# Patient Record
Sex: Female | Born: 1960 | Race: White | Hispanic: No
Health system: Midwestern US, Community
[De-identification: ages and names within clinical notes are randomized; demographics above are authoritative.]

## PROBLEM LIST (undated history)

## (undated) DIAGNOSIS — J45909 Unspecified asthma, uncomplicated: Secondary | ICD-10-CM

## (undated) DIAGNOSIS — J449 Chronic obstructive pulmonary disease, unspecified: Secondary | ICD-10-CM

## (undated) DIAGNOSIS — D649 Anemia, unspecified: Secondary | ICD-10-CM

## (undated) DIAGNOSIS — B182 Chronic viral hepatitis C: Secondary | ICD-10-CM

## (undated) DIAGNOSIS — D696 Thrombocytopenia, unspecified: Secondary | ICD-10-CM

## (undated) DIAGNOSIS — F329 Major depressive disorder, single episode, unspecified: Secondary | ICD-10-CM

## (undated) DIAGNOSIS — F419 Anxiety disorder, unspecified: Secondary | ICD-10-CM

## (undated) DIAGNOSIS — T7840XA Allergy, unspecified, initial encounter: Secondary | ICD-10-CM

## (undated) DIAGNOSIS — M1990 Unspecified osteoarthritis, unspecified site: Secondary | ICD-10-CM

## (undated) DIAGNOSIS — K7460 Unspecified cirrhosis of liver: Secondary | ICD-10-CM

## (undated) DIAGNOSIS — M549 Dorsalgia, unspecified: Secondary | ICD-10-CM

## (undated) DIAGNOSIS — G8929 Other chronic pain: Secondary | ICD-10-CM

## (undated) DIAGNOSIS — F32A Depression, unspecified: Secondary | ICD-10-CM

## (undated) DIAGNOSIS — M5430 Sciatica, unspecified side: Secondary | ICD-10-CM

## (undated) DIAGNOSIS — I10 Essential (primary) hypertension: Secondary | ICD-10-CM

## (undated) DIAGNOSIS — E876 Hypokalemia: Secondary | ICD-10-CM

## (undated) DIAGNOSIS — M5416 Radiculopathy, lumbar region: Principal | ICD-10-CM

## (undated) DIAGNOSIS — R0602 Shortness of breath: Secondary | ICD-10-CM

## (undated) DIAGNOSIS — N182 Chronic kidney disease, stage 2 (mild): Secondary | ICD-10-CM

## (undated) DIAGNOSIS — F17200 Nicotine dependence, unspecified, uncomplicated: Secondary | ICD-10-CM

## (undated) DIAGNOSIS — W57XXXA Bitten or stung by nonvenomous insect and other nonvenomous arthropods, initial encounter: Secondary | ICD-10-CM

## (undated) HISTORY — PX: BACK SURGERY: SHX140

## (undated) HISTORY — DX: Anxiety disorder, unspecified: F41.9

## (undated) HISTORY — DX: Unspecified osteoarthritis, unspecified site: M19.90

## (undated) HISTORY — DX: Essential (primary) hypertension: I10

## (undated) HISTORY — DX: Allergy, unspecified, initial encounter: T78.40XA

## (undated) HISTORY — PX: TUBAL LIGATION: SHX77

## (undated) HISTORY — DX: Unspecified asthma, uncomplicated: J45.909

## (undated) NOTE — Progress Notes (Signed)
 Formatting of this note is different from the original. Images from the original note were not included.  Jordan Grnak, D.O.  [facility] [address] [place], [place]  [address] Tel: [phone]  Office Visit: Follow Up   Patient Name: Cassandra Montes  DOB:  Sep 06, 1961  MRN:   [id number]    Today's Date: 04/17/21 12:59 PM  Subjective   The patient is a 56 y.o. year old female with a pmh as listed below. She presents today for follow up for chronic liver disease, CKD, obesity, hypertension, and anxiety/depression. Since her last visit she had her mammogram done and low dose CT of her chest, both of which showed no evidence of malignancy. She has her OB/GYN appointment on 04/22/2021 for establishing care and a pap smear. She states she was taken off lisinopril/HCTZ due being switched to Spironolactone. She has an EGD next month. She will see her transplant doctor soon but does not know when it is exactly. She has not seen pain management yet.   Review of Systems  Constitutional: Negative.   HENT: Negative.   Respiratory: Negative.   Cardiovascular: Negative.   Gastrointestinal: Negative.   Genitourinary: Negative.   Musculoskeletal: Negative.   Skin: Negative.   Neurological: Negative.  Negative for dizziness.  Endo/Heme/Allergies: Negative for environmental allergies and polydipsia. Bruises/bleeds easily.  Psychiatric/Behavioral: Negative.    Past Medical History:  Diagnosis Date  ? Anxiety and depression 03/06/2021  ? Chronic hepatitis C without hepatic coma (HCC)   ? Cirrhosis of liver without ascites (HCC) 03/06/2021  ? Obesity (BMI 30.0-34.9) 03/06/2021  ? Primary hypertension 03/06/2021  ? Stage 2 chronic kidney disease 03/06/2021  ? Thrombocytopenia (HCC) 04/17/2021    Social History   Socioeconomic History  ? Marital status: SINGLE    Spouse name: Not on file  ? Number of children: Not on file  ? Years of education: Not on file  ? Highest  education level: Not on file  Occupational History  ? Not on file  Tobacco Use  ? Smoking status: Current Every Day Smoker    Packs/day: 0.50    Years: 40.00    Pack years: 20.00    Types: Cigarettes  ? Smokeless tobacco: Never Used  Substance and Sexual Activity  ? Alcohol use: No  ? Drug use: No  ? Sexual activity: Yes  Other Topics Concern  ? Not on file  Social History Narrative  ? Not on file   Social Determinants of Health   Financial Resource Strain:   ? Difficulty of Paying Living Expenses: Not on file  Food Insecurity:   ? Worried About Running Out of Food in the Last Year: Not on file  ? Ran Out of Food in the Last Year: Not on file  Transportation Needs:   ? Lack of Transportation (Medical): Not on file  ? Lack of Transportation (Non-Medical): Not on file  Physical Activity:   ? Days of Exercise per Week: Not on file  ? Minutes of Exercise per Session: Not on file  Stress:   ? Feeling of Stress : Not on file  Social Connections:   ? Frequency of Communication with Friends and Family: Not on file  ? Frequency of Social Gatherings with Friends and Family: Not on file  ? Attends Religious Services: Not on file  ? Active Member of Clubs or Organizations: Not on file  ? Attends Banker Meetings: Not on file  ? Marital Status: Not on file  Intimate Partner Violence:   ? Fear of Current or Ex-Partner: Not on file  ? Emotionally Abused: Not on file  ? Physically Abused: Not on file  ? Sexually Abused: Not on file  Housing Stability:   ? Unable to Pay for Housing in the Last Year: Not on file  ? Number of Places Lived in the Last Year: Not on file  ? Unstable Housing in the Last Year: Not on file    Current Outpatient Medications  Medication Sig  ? varicella-zoster (Shingrix Adjuvant Component-PF) injection 1 Each by IntraMUSCular route once for 1 dose.  ? ALPRAZolam  (XANAX ) 0.25 mg tablet Take 1 Tablet by mouth two (2) times a day. Max Daily  Amount: 0.5 mg.  ? albuterol  (PROVENTIL  HFA, VENTOLIN  HFA, PROAIR  HFA) 90 mcg/actuation inhaler Take 1 Puff by inhalation every four (4) hours as needed for Wheezing.  ? albuterol -ipratropium (DUO-NEB) 2.5 mg-0.5 mg/3 ml nebu INHALE CONTENTS OF 1 VIAL VIA NEBULIZER DAILY AS NEEDED FOR WHEEZING  ? amLODIPine  (NORVASC ) 10 mg tablet amlodipine  10 mg tablet  ? omeprazole (PRILOSEC) 40 mg capsule Take 1 Capsule by mouth daily for 90 days.  ? ondansetron  hcl (ZOFRAN ) 8 mg tablet ondansetron  HCl 8 mg tablet  ? furosemide (LASIX) 20 mg tablet Take 1 Tablet by mouth daily for 90 days.  ? potassium chloride  (K-DUR, KLOR-CON  M20) 20 mEq tablet take 1 tablet twice a day  ? lactulose  (CHRONULAC ) 20 gram/30 mL soln solution Take 30 mL by mouth nightly for 90 days.  ? beclomethasone (Qvar) 40 mcg/actuation aero Take 1 Puff by inhalation two (2) times a day.  ? albuterol  sulfate 2.5 mg/0.5 mL nebu 2.5 mg, ipratropium 0.02 % soln 0.5 mg 1 Dose by Nebulization route as needed for Wheezing.  ? fluticasone propion-salmeteroL (ADVAIR/WIXELA) 250-50 mcg/dose diskus inhaler fluticasone 250 mcg-salmeterol 50 mcg/dose blistr powdr for inhalation  INHALE 1 PUFF BY MOUTH TWICE DAILY  ? citalopram (CELEXA) 40 mg tablet citalopram 40 mg tablet  TAKE 1 TABLET BY MOUTH EVERY MORNING  ? zinc sulfate (ZINCATE) 50 mg zinc (220 mg) capsule zinc sulfate 50 mg zinc (220 mg) capsule  TAKE ONE CAPSULE BY MOUTH TWICE DAILY WITH MEALS.   No current facility-administered medications for this visit.   Objective   Vitals:   04/17/21 1311  BP: (!) 109/59  BP 1 Location: Left upper arm  BP Patient Position: Sitting  BP Cuff Size: Adult long  Pulse: 63  Temp: 98 F (36.7 C)  TempSrc: Temporal  Resp: 12  Height: 5' 5 (1.651 m)  Weight: 191 lb (86.6 kg) Comment: with shoes  SpO2: 98% Comment: Room air    Physical Exam: Constitutional: Appears well kempt. Alert/oriented x3. In no acute distress. Head: Normocephalic No  trauma. No deformity. No bruits.  Neck: Supple. ROM normal. No tenderness. No masses. Cardiac: Heart with normal rate/rhythm. No murmurs. No gallops. Pulses normal.  Skin: Small areas of bruises/ecchymosis on bilateral arms, hands, and on her chest Pulmonary: Lungs clear to auscultation bilaterally. In no respiratory distress. No wheezing. No rales. No rhonci.  Psychiatric: Normal thought content. Normal behavior. Normal judgment.   Recommendations   Assessment: Patient Active Problem List  Diagnosis Code  ? Primary hypertension I10  ? Anxiety and depression F41.9, F32.A  ? Obesity (BMI 30.0-34.9) E66.9  ? Cirrhosis of liver without ascites (HCC) K74.60  ? Stage 2 chronic kidney disease N18.2  ? Thrombocytopenia (HCC) D69.6   Plan: -continue current treatment plan  -follow up  with OB/GYN -refill xanax  -follow up with GI/transplant doctor as needed -recommend patient discuss her low platelets with GI next month, I do think her thrombocytopenia is due to her advanced liver disease, will refer to Dr. Mckinley with hematology, she has seen him before; she will also call his office to try and expedite the appointment  -The patient expresses understanding of the plan as I've explained it to her and is in agreement with the current plan. -schedule AWV -follow up in 6 months  Time Spent in Patient Room: 15 minutes Time Spent Pre- and Post Reviewing patient's chart: 15 minutes Total Time: 30 minutes  Signed: Jordan Grnak, D.O. 04/17/21 12:59 PM Electronically signed by Grnak, Jordan J, DO at 04/17/2021  1:36 PM EDT

---

## 2007-12-23 NOTE — ED Notes (Signed)
I have reviewed discharge instructions with the patient.  The patient verbalized understanding.

## 2007-12-23 NOTE — ED Notes (Signed)
Ambulatory  To room # 15 for exam, reports awoke this am with pain in left hand, weak grip on left, also reports numbness in 4th & 5th fingers. Visitor at side

## 2007-12-23 NOTE — ED Provider Notes (Signed)
Hand Pain   The history is provided by the patient and spouse. This is a chronic problem. Episode Onset: history of carpal tunnel syndrome. Pain worse in past few days. The problem occurs daily. The problem has been gradually worsening. The pain is present in the left hand. Quality: pain in thumb, index, and long fingers. Numbness in ring and small fingers.  The pain is moderate. Associated symptoms include numbness and tingling. Pertinent negatives include no limited range of motion, no back pain and no neck pain. Treatments Tried: ibuprofen 400 mg this am. Has had cortisone injection in elbow, shoulder and hand in the past. There has been no history of trauma.        No past medical history on file.     Past Surgical History   Procedure Date   ??? Hx gyn      BTL           No family history on file.     History   Social History   ??? Marital Status: Single     Spouse Name: N/A     Number of Children: N/A   ??? Years of Education: N/A   Occupational History   ??? Not on file.   Social History Main Topics   ??? Tobacco Use: Yes -- 1.5 packs/day   ??? Alcohol Use: No   ??? Drug Use:    ??? Sexually Active:    Other Topics Concern   ??? Not on file   Social History Narrative   ??? No narrative on file           ALLERGIES: Review of patient's allergies indicates no known allergies.      Review of Systems   Constitutional: Negative.    HENT: Negative for neck pain.    Respiratory: Positive for cough and wheezing.         Out of inhaler.   Cardiovascular: Negative.    Musculoskeletal: Positive for myalgias. Negative for back pain.   Skin: Negative.    Neurological: Positive for tingling, sensory change and numbness.   Psychiatric/Behavioral: Negative.        Filed Vitals:    12/23/2007 11:02 AM   BP: 123/67   Pulse: 90   Temp: 98.1 ??F (36.7 ??C)   Resp: 16   Height: 5\' 5"  (1.651 m)   Weight: 175 lb (79.379 kg)   SpO2: 99%              Physical Exam   Nursing note and vitals reviewed.   Constitutional: She is oriented. She appears well-developed and well-nourished. She appears not diaphoretic. No distress.   HENT:   Head: Normocephalic and atraumatic.   Eyes: Pupils are equal, round, and reactive to light.   Neck: Normal range of motion. Neck supple.   Cardiovascular: Normal rate, regular rhythm, normal heart sounds and intact distal pulses.    Pulmonary/Chest: Effort normal. No respiratory distress. She has wheezes (few scattered.). She has no rales. She exhibits no tenderness.   Musculoskeletal:        Left hand with muscle wasting in web space between thumb and index fingers. Subjective decrease sensation ring and small fingers. Strength is slightly less than right hand. Pulses strong. No localized tenderness, redness, deformity.   Lymphadenopathy:     She has no cervical adenopathy.   Neurological: She is alert and oriented. She displays normal reflexes. No cranial nerve deficit. She exhibits normal muscle tone. Coordination normal.   Skin: Skin  is warm and dry. She is not diaphoretic.   Psychiatric: She has a normal mood and affect. Her behavior is normal.                Hand pain with neuropathy.  Wheezing. Continues to smoke.  Refill inhaler.  Pain medication.  Further evaluation by PCP.

## 2009-04-02 MED ORDER — TRAMADOL 50 MG TAB
50 mg | ORAL_TABLET | Freq: Four times a day (QID) | ORAL | Status: AC | PRN
Start: 2009-04-02 — End: 2009-04-12

## 2009-04-02 MED ORDER — CYCLOBENZAPRINE 10 MG TAB
10 mg | ORAL_TABLET | Freq: Three times a day (TID) | ORAL | Status: AC | PRN
Start: 2009-04-02 — End: 2009-04-12

## 2009-04-02 MED ORDER — METHYLPREDNISOLONE 4 MG TABS IN A DOSE PACK
4 mg | PACK | ORAL | Status: AC
Start: 2009-04-02 — End: 2009-04-08

## 2009-04-02 NOTE — ED Notes (Signed)
Pt signed hard copy of discharge papers due to no e-signs being available at time of discharge.

## 2009-04-02 NOTE — ED Provider Notes (Signed)
HPI Comments: Patient presents to ed with right sided back pain that began yesterday but worsened today. The patient got off of work and had no pain but it began to worsen over night. The pain is worse with palpation and deep breathing. The patient has history of   pleurisy. It feels like the same but no recent history of uri/bronchitis/pneumonia.     Patient is a 48 y.o. female presenting with back pain. The history is provided by the patient. No language interpreter was used.   Back Pain   This is a new problem. The current episode started yesterday. The problem has not changed since onset. The problem occurs constantly. Patient reports no work related injury.The pain is associated with no known injury. The pain is present in the right side. The quality of the pain is described as cramping and sharp. The pain does not radiate. The pain is severe. The pain is the same all the time. Pertinent negatives include no chest pain, no fever, no numbness, no abdominal pain, no abdominal swelling, no bowel incontinence, no bladder incontinence, no dysuria, no paresthesias and no paresis. She has tried bed rest for the symptoms. The treatment provided no relief. Risk factors include menopause. The patient's surgical history non-contributory        Past Medical History   Diagnosis Date   ??? COPD    ??? Hypertension    ??? Psychiatric disorder      depression, anxiety          Past Surgical History   Procedure Date   ??? Hx gyn      BTL           No family history on file.     History   Social History   ??? Marital Status: Single     Spouse Name: N/A     Number of Children: N/A   ??? Years of Education: N/A   Occupational History   ??? Not on file.   Social History Main Topics   ??? Tobacco Use: Yes -- 1.5 packs/day   ??? Alcohol Use: No   ??? Drug Use:    ??? Sexually Active:    Other Topics Concern   ??? Not on file   Social History Narrative   ??? No narrative on file           ALLERGIES: Review of patient's allergies indicates no known allergies.       Review of Systems   Constitutional: Negative for fever.   Cardiovascular: Negative for chest pain.   Gastrointestinal: Negative for abdominal pain.   Genitourinary: Negative for bladder incontinence and dysuria.   Musculoskeletal: Positive for back pain.   Neurological: Negative for numbness.   All other systems reviewed and are negative.        Filed Vitals:    04/02/2009 11:01 AM 04/02/2009 11:02 AM   BP: 161/94    Pulse: 90    Temp: 98.1 ??F (36.7 ??C)    Resp: 15    Height:  5\' 5"  (1.651 m)   Weight:  165 lb (74.844 kg)   SpO2:  100%              Physical Exam   Nursing note and vitals reviewed.  Constitutional: She is oriented. She appears well-developed and well-nourished.   HENT:   Head: Normocephalic and atraumatic.   Right Ear: External ear normal.   Left Ear: External ear normal.   Nose: Nose normal.   Mouth/Throat:  Oropharynx is clear and moist.   Eyes: Conjunctivae and extraocular motions are normal. Pupils are equal, round, and reactive to light.   Neck: Normal range of motion. Neck supple.   Cardiovascular: Normal rate, regular rhythm, normal heart sounds and intact distal pulses.    Pulmonary/Chest: Effort normal and breath sounds normal. No respiratory distress. She has no wheezes.     She exhibits tenderness.   Abdominal: Soft. Bowel sounds are normal.   Musculoskeletal: Normal range of motion. She exhibits no edema and no tenderness.   Neurological: She is alert and oriented. No cranial nerve deficit. She exhibits normal muscle tone.   Skin: Skin is warm and dry.   Psychiatric: She has a normal mood and affect. Her behavior is normal. Judgment and thought content normal.            Coding      Procedures

## 2009-04-02 NOTE — ED Notes (Signed)
I have reviewed discharge instructions with the patient.  The patient verbalized understanding. Dr. Sheffield Slider and RN spoke with pt at length regarding plan of treatment. Rx, work note for today and tomorrow, and discharge instructions given to pt. Pt and significant other with no further concerns or questions at this time. Pt informed to follow up with PCP and return to ER if needed.

## 2009-04-02 NOTE — ED Notes (Signed)
Received triage report at this time.

## 2009-04-02 NOTE — ED Notes (Signed)
Will discharge pt once registered.

## 2009-09-21 LAB — CBC WITH AUTOMATED DIFF
ABS. BASOPHILS: 0 10*3/uL (ref 0.0–0.2)
ABS. EOSINOPHILS: 0.1 10*3/uL (ref 0.0–0.8)
ABS. IMM. GRANS.: 0 10*3/uL (ref 0.0–2.0)
ABS. LYMPHOCYTES: 2.2 10*3/uL (ref 0.5–4.6)
ABS. MONOCYTES: 0.7 10*3/uL (ref 0.1–1.3)
ABS. NEUTROPHILS: 4.9 10*3/uL (ref 1.7–8.2)
BASOPHILS: 0 % (ref 0.0–2.0)
EOSINOPHILS: 1 % (ref 0.5–7.8)
HCT: 38.5 % (ref 37.6–48.3)
HGB: 11.6 g/dL — ABNORMAL LOW (ref 11.7–15.0)
IMMATURE GRANULOCYTES: 0.1 % (ref 0.0–2.0)
LYMPHOCYTES: 28 % (ref 13–44)
MCH: 24.3 PG — ABNORMAL LOW (ref 26.1–32.9)
MCHC: 30.1 g/dL — ABNORMAL LOW (ref 31.4–35.0)
MCV: 80.7 FL (ref 79.6–97.8)
MONOCYTES: 8 % (ref 4.0–12.0)
MPV: 10.1 FL — ABNORMAL LOW (ref 10.8–14.1)
NEUTROPHILS: 63 % (ref 43–78)
PLATELET: 250 10*3/uL (ref 140–440)
RBC: 4.77 M/uL (ref 3.86–5.18)
RDW: 15.9 % — ABNORMAL HIGH (ref 11.9–14.6)
WBC: 7.9 10*3/uL (ref 4.0–10.5)

## 2009-09-21 LAB — METABOLIC PANEL, COMPREHENSIVE
A-G Ratio: 1.1 — ABNORMAL LOW (ref 1.2–3.5)
ALT (SGPT): 44 U/L (ref 39–65)
AST (SGOT): 86 U/L — ABNORMAL HIGH (ref 15–37)
Albumin: 3.8 g/dL (ref 3.5–5.0)
Alk. phosphatase: 116 U/L (ref 50–136)
Anion gap: 12 mmol/L (ref 7–16)
BUN: 4 MG/DL — ABNORMAL LOW (ref 7–18)
Bilirubin, total: 0.3 MG/DL (ref 0.2–1.1)
CO2: 23 MMOL/L (ref 21–32)
Calcium: 8.4 MG/DL (ref 8.4–10.4)
Chloride: 102 MMOL/L (ref 98–107)
Creatinine: 1 MG/DL (ref 0.6–1.0)
GFR est AA: >60 mL/min/{1.73_m2} (ref >60)
GFR est non-AA: >60 mL/min/{1.73_m2} (ref >60)
Globulin: 3.5 g/dL (ref 2.3–3.5)
Glucose: 97 MG/DL (ref 74–106)
Potassium: 2.8 MMOL/L — CL (ref 3.5–5.1)
Protein, total: 7.3 g/dL (ref 6.3–8.2)
Sodium: 137 MMOL/L (ref 136–145)

## 2009-09-21 LAB — PROTHROMBIN TIME + INR
INR: 1.1 (ref 0.9–1.2)
Prothrombin time: 10.9 SECS — ABNORMAL HIGH (ref 9.4–10.8)

## 2009-09-21 LAB — MAGNESIUM: Magnesium: 1.4 MG/DL — CL (ref 1.8–2.4)

## 2009-09-21 MED ORDER — POTASSIUM CHLORIDE SR 20 MEQ TAB, PARTICLES/CRYSTALS
20 mEq | ORAL_TABLET | Freq: Every day | ORAL | Status: AC
Start: 2009-09-21 — End: 2009-10-21

## 2009-09-21 MED ORDER — CYCLOBENZAPRINE 10 MG TAB
10 mg | ORAL_TABLET | Freq: Three times a day (TID) | ORAL | Status: DC | PRN
Start: 2009-09-21 — End: 2009-09-21

## 2009-09-21 MED ORDER — NAPROXEN 500 MG TAB
500 mg | ORAL_TABLET | Freq: Two times a day (BID) | ORAL | Status: AC
Start: 2009-09-21 — End: 2009-10-01

## 2009-09-21 MED ORDER — MAGNESIUM OXIDE 400 MG TAB
400 mg | ORAL_TABLET | Freq: Every day | ORAL | Status: AC
Start: 2009-09-21 — End: 2009-10-21

## 2009-09-21 MED ORDER — OXYCODONE-ACETAMINOPHEN 5 MG-325 MG TAB
5-325 mg | ORAL_TABLET | Freq: Three times a day (TID) | ORAL | Status: AC | PRN
Start: 2009-09-21 — End: 2009-09-28

## 2009-09-21 MED ORDER — CYCLOBENZAPRINE 10 MG TAB
10 mg | ORAL_TABLET | Freq: Three times a day (TID) | ORAL | Status: AC | PRN
Start: 2009-09-21 — End: 2009-10-01

## 2009-09-21 MED ADMIN — potassium chloride (KAON 10%) oral liquid 30 mL: ORAL | @ 14:00:00 | NDC 00121146515

## 2009-09-21 MED ADMIN — aspirin (ASPIRIN) 325 mg tablet: @ 14:00:00 | NDC 66553000101

## 2009-09-21 MED ADMIN — ketorolac (TORADOL) injection 30 mg: INTRAVENOUS | @ 15:00:00 | NDC 00409379501

## 2009-09-21 MED ADMIN — magnesium oxide (MAG-OX) tablet 400 mg: ORAL | @ 17:00:00 | NDC 00165002241

## 2009-09-21 MED FILL — GENACOTE 325 MG TABLET,DELAYED RELEASE: 325 mg | ORAL | Qty: 1

## 2009-09-21 MED FILL — POTASSIUM CHLORIDE 10 % ORAL LIQUID: 20 mEq/15 mL | ORAL | Qty: 30

## 2009-09-21 MED FILL — ASPIRIN 325 MG TAB: 325 mg | ORAL | Qty: 1

## 2009-09-21 MED FILL — KETOROLAC TROMETHAMINE 30 MG/ML INJECTION: 30 mg/mL (1 mL) | INTRAMUSCULAR | Qty: 1

## 2009-09-21 MED FILL — MAGOX 400 MG (241.3 MG MAGNESIUM) TABLET: 400 mg (241.3 mg magnesium) | ORAL | Qty: 1

## 2009-09-21 NOTE — ED Notes (Signed)
Dr. Orvan Falconer at bedside. Pt states pain "still a 3".  Pt is up at bedside and has removed all monitoring equipment.

## 2009-09-21 NOTE — ED Notes (Signed)
Dr. Campbell at bedside.

## 2009-09-21 NOTE — ED Provider Notes (Signed)
Patient is a 48 y.o. female presenting with chest pain. The history is provided by the patient. No language interpreter was used.   Chest Pain (Angina)   This is a new problem. The current episode started 2 days ago. The problem has been gradually worsening. Duration of episode(s) is 2 days. The problem occurs constantly. The pain is associated with movement and breathing. The pain is present in the substernal region and left side. The quality of the pain is described as tightness, sharp and pressure-like. The symptoms are aggravated by movement, certain positions and deep breathing. Pertinent negatives include no diaphoresis, no malaise/fatigue, no palpitations, no nausea, no vomiting, no dizziness, no weakness and no shortness of breath. Risk factors include smoking/tobacco exposure and a sendentary lifestyle. Her past medical history is significant for HTN.Her past medical history does not include aneurysm, DM, DVT or PE. Pertinent negatives include no cardiac catheterization.       Past Medical History   Diagnosis Date   ??? COPD    ??? Hypertension    ??? Psychiatric disorder      depression, anxiety          Past Surgical History   Procedure Date   ??? Hx gyn      BTL           No family history on file.     History   Social History   ??? Marital Status: Single     Spouse Name: N/A     Number of Children: N/A   ??? Years of Education: N/A   Occupational History   ??? Not on file.   Social History Main Topics   ??? Tobacco Use: Yes -- 1.5 packs/day   ??? Alcohol Use: No   ??? Drug Use:    ??? Sexually Active:    Other Topics Concern   ??? Not on file   Social History Narrative   ??? No narrative on file           ALLERGIES: Review of patient's allergies indicates no known allergies.      Review of Systems   Constitutional: Negative.  Negative for malaise/fatigue and diaphoresis.   HENT: Negative.    Eyes: Negative.    Respiratory: Positive for chest tightness. Negative for shortness of breath.     Cardiovascular: Positive for chest pain. Negative for palpitations and leg swelling.   Gastrointestinal: Negative.  Negative for nausea and vomiting.   Genitourinary: Negative.    Musculoskeletal: Negative.    Skin: Negative.    Neurological: Negative.  Negative for dizziness and weakness.   Psychiatric/Behavioral: Negative.        There were no vitals filed for this visit.         Physical Exam   Nursing note and vitals reviewed.  Constitutional: She is oriented to person, place, and time. She appears well-developed and well-nourished. She appears distressed (mild).   HENT:   Head: Normocephalic and atraumatic.   Right Ear: External ear normal.   Left Ear: External ear normal.   Nose: Nose normal.   Mouth/Throat: Oropharynx is clear and moist. No oropharyngeal exudate.   Eyes: Conjunctivae are normal. Pupils are equal, round, and reactive to light. Right eye exhibits no discharge. Left eye exhibits no discharge. No scleral icterus.   Neck: Normal range of motion. Neck supple. No JVD present. No tracheal deviation present. No thyromegaly present.   Cardiovascular: Normal rate, regular rhythm, normal heart sounds and intact distal pulses.  Exam  reveals no gallop and no friction rub.    No murmur heard.  Pulmonary/Chest: Effort normal and breath sounds normal. No stridor. No respiratory distress. She has no wheezes. She has no rales. She exhibits tenderness (small bruise left chest).   Abdominal: Soft. Bowel sounds are normal. She exhibits no distension and no mass. No tenderness. She has no rebound and no guarding.   Musculoskeletal: Normal range of motion. She exhibits no edema and no tenderness.   Lymphadenopathy:     She has no cervical adenopathy.   Neurological: She is alert and oriented to person, place, and time. She displays normal reflexes. No cranial nerve deficit. She exhibits normal muscle tone. Coordination normal.    Skin: Skin is warm and dry. No rash noted. She is not diaphoretic. No erythema. No pallor.   Psychiatric: She has a normal mood and affect. Her behavior is normal. Judgment and thought content normal.      Recent Results (from the past 12 hour(s))   CBC WITH AUTOMATED DIFF    Collection Time    09/21/09  8:40 AM   Component Value Range   ??? WBC 7.9  4.0 - 10.5 (K/uL)   ??? RBC 4.77  3.86 - 5.18 (M/uL)   ??? HGB 11.6 (*) 11.7 - 15.0 (g/dL)   ??? HCT 38.5  37.6 - 48.3 (%)   ??? MCV 80.7  79.6 - 97.8 (FL)   ??? MCH 24.3 (*) 26.1 - 32.9 (PG)   ??? MCHC 30.1 (*) 31.4 - 35.0 (g/dL)   ??? RDW 15.9 (*) 11.9 - 14.6 (%)   ??? PLATELET 250  140 - 440 (K/uL)   ??? MPV 10.1 (*) 10.8 - 14.1 (FL)   ??? DF AUTOMATED     ??? NEUTROPHILS 63  43 - 78 (%)   ??? LYMPHOCYTES 28  13 - 44 (%)   ??? MONOCYTES 8  4.0 - 12.0 (%)   ??? EOSINOPHILS 1  0.5 - 7.8 (%)   ??? BASOPHILS 0  0.0 - 2.0 (%)   ??? ABSOLUTE NEUTS 4.9  1.7 - 8.2 (K/UL)   ??? ABSOLUTE LYMPHS 2.2  0.5 - 4.6 (K/UL)   ??? ABSOLUTE MONOS 0.7  0.1 - 1.3 (K/UL)   ??? ABSOLUTE EOSINS 0.1  0.0 - 0.8 (K/UL)   ??? ABSOLUTE BASOS 0.0  0.0 - 0.2 (K/UL)   ??? IMM. GRANS. 0.1  0.0 - 2.0 (%)   ??? ABS. IMM. GRANS. 0.0  0.0 - 2.0 (K/UL)   METABOLIC PANEL, COMPREHENSIVE    Collection Time    09/21/09  8:40 AM   Component Value Range   ??? Sodium 137  136 - 145 (MMOL/L)   ??? Potassium 2.8 (*) 3.5 - 5.1 (MMOL/L)   ??? Chloride 102  98 - 107 (MMOL/L)   ??? CO2 23  21 - 32 (MMOL/L)   ??? Anion gap 12  7 - 16 (mmol/L)   ??? Glucose 97  74 - 106 (MG/DL)   ??? BUN 4 (*) 7 - 18 (MG/DL)   ??? Creatinine 1.0  0.6 - 1.0 (MG/DL)   ??? GFR est AA >60  >60 (ml/min/1.57m2)   ??? GFR est non-AA >60  >60 (ml/min/1.60m2)   ??? Calcium 8.4  8.4 - 10.4 (MG/DL)   ??? Bilirubin, total 0.3  0.2 - 1.1 (MG/DL)   ??? ALT 44  39 - 65 (U/L)   ??? AST 86 (*) 15 - 37 (U/L)   ??? Alk. phosphatase 116  50 -  136 (U/L)   ??? Protein, total 7.3  6.3 - 8.2 (g/dL)   ??? Albumin 3.8  3.5 - 5.0 (g/dL)   ??? Globulin 3.5  2.3 - 3.5 (g/dL)   ??? A-G Ratio 1.1 (*) 1.2 - 3.5 ( )   PROTHROMBIN TIME     Collection Time    09/21/09  8:40 AM   Component Value Range   ??? Prothrombin Time-PT 10.9 (*) 9.4 - 10.8 (SECS)   ??? INR 1.1  0.9 - 1.2 ( )   MAGNESIUM    Collection Time    09/21/09  8:40 AM   Component Value Range   ??? Magnesium 1.4 (*) 1.8 - 2.4 (MG/DL)         MDM Coding   Interpretation: labs, ECG and x-ray  Total time providing critical care: 31-74 minutes. This excludes time spent performing separately reportable procedures and services.        Procedures    Two sets of cardiac enzymes neg today.   CXR neg. Right thumb xray neg.   Discussed with Dr. Einar Pheasant. Will set up for stress test.  Toradol did help pain. But still tender to palpation .

## 2009-09-21 NOTE — ED Notes (Signed)
Pt restful after ambulation to BR. NAD at this time, talkative with visitors.

## 2009-09-21 NOTE — ED Notes (Signed)
Pt denies she is having respiratory issues, but she clears her throat, or has a dry cough very frequently during evaluation. She states that is not normal but "it's just a tickle".

## 2009-09-21 NOTE — ED Notes (Signed)
Patient refused Naprosyn. States she had liver problems but was able to take percocet. Explained to her this contains tylenol. Patient would not leave without pain meds. Offered oral Toradol. Declined. Finally given Rx for Percocet 5/325 # 20.

## 2009-09-21 NOTE — ED Notes (Signed)
Magnesium level 1.4 called from lab and reported to Dr. Orvan Falconer

## 2009-09-21 NOTE — ED Notes (Signed)
EKG interpretation: (Preliminary)  Rhythm: sinus tachycardia; and regular . Rate (approx.): 105; Axis: normal; P wave: ? biatrial enlargement; QRS interval: septal infarct; ST/T wave: normal;; Other findings: abnormal ekg.

## 2009-09-22 LAB — EKG, 12 LEAD, INITIAL
Atrial Rate: 105 {beats}/min
Calculated P Axis: 81 degrees
Calculated R Axis: 69 degrees
Calculated T Axis: 82 degrees
P-R Interval: 156 ms
Q-T Interval: 372 ms
QRS Duration: 84 ms
QTC Calculation (Bezet): 491 ms
Ventricular Rate: 105 {beats}/min

## 2009-12-04 MED ORDER — TRAMADOL-ACETAMINOPHEN 37.5 MG-325 MG TAB
ORAL_TABLET | Freq: Four times a day (QID) | ORAL | Status: AC | PRN
Start: 2009-12-04 — End: 2009-12-09

## 2009-12-04 MED ADMIN — tramadol (ULTRAM) tablet 50 mg: ORAL | @ 17:00:00 | NDC 51079099101

## 2009-12-04 MED FILL — TRAMADOL 50 MG TAB: 50 mg | ORAL | Qty: 1

## 2009-12-04 NOTE — ED Provider Notes (Cosign Needed)
HPI Comments: Pt fell at home yesterday, mild facial pain but has aggravated mid back area pain, she has had pain in that area for some time, pmd treated with aleve, fall has made it worse     Patient is a 48 y.o. female presenting with back pain. The history is provided by the patient.   Back Pain   This is a new problem. The current episode started yesterday. The problem has not changed since onset. The problem occurs constantly. Patient reports no work related injury.The pain is associated with a fall. The pain is present in the thoracic spine. The quality of the pain is described as similar to previous episodes. The pain does not radiate. The pain is at a severity of 6/10. The pain is moderate. The symptoms are aggravated by certain positions. The pain is the same all the time. Pertinent negatives include no tingling. bruise to face. She has tried NSAIDs for the symptoms. The treatment provided no relief.        Past Medical History   Diagnosis Date   ??? COPD    ??? Hypertension    ??? Psychiatric disorder      depression, anxiety          Past Surgical History   Procedure Date   ??? Hx gyn      BTL           No family history on file.     History   Social History   ??? Marital Status: Single     Spouse Name: N/A     Number of Children: N/A   ??? Years of Education: N/A   Occupational History   ??? Not on file.   Social History Main Topics   ??? Smoking status: Current Everyday Smoker -- 1.5 packs/day   ??? Smokeless tobacco: Never Used   ??? Alcohol Use: No   ??? Drug Use:    ??? Sexually Active:    Other Topics Concern   ??? Not on file   Social History Narrative   ??? No narrative on file           ALLERGIES: Review of patient's allergies indicates no known allergies.      Review of Systems   Musculoskeletal: Positive for back pain.   Neurological: Negative for tingling.   All other systems reviewed and are negative.        Filed Vitals:    12/04/2009 11:08 AM   BP: 161/97   Pulse: 99   Temp: 97.8 ??F (36.6 ??C)   Resp: 16    Height: 5\' 5"  (1.651 m)   Weight: 158 lb (71.668 kg)   SpO2: 100%              Physical Exam   Nursing note and vitals reviewed.  Constitutional: She is oriented to person, place, and time. She appears well-developed and well-nourished. No distress.   HENT:   Head: Normocephalic.        Slight bruise to rt cheek area   Eyes: Conjunctivae and extraocular motions are normal. Pupils are equal, round, and reactive to light.   Neck: Normal range of motion. Neck supple.   Cardiovascular: Normal rate and regular rhythm.    Pulmonary/Chest: Effort normal and breath sounds normal. No respiratory distress. She has no wheezes.   Abdominal: Soft. Bowel sounds are normal.   Musculoskeletal: She exhibits tenderness. She exhibits no edema.        Back:  Rt mid thoracic area pain to palpation no abrasions   Neurological: She is alert and oriented to person, place, and time.   Skin: Skin is warm.        MDM Coding   Reviewed: previous chart, nursing note and vitals  Interpretation: x-ray        Procedures    t spine films negative except for mild degenerative changes

## 2009-12-04 NOTE — ED Notes (Signed)
Copy of discharge instructions given to patient. Denies any questions at this time.

## 2009-12-04 NOTE — ED Notes (Signed)
Practioner discussing test results with patient.

## 2009-12-04 NOTE — ED Notes (Signed)
To xray

## 2009-12-04 NOTE — ED Notes (Signed)
Practioner performing initial assessment of stable patient

## 2010-04-16 LAB — METABOLIC PANEL, BASIC
Anion gap: 7 mmol/L (ref 7–16)
BUN: 6 MG/DL — ABNORMAL LOW (ref 7–18)
CO2: 25 MMOL/L (ref 21–32)
Calcium: 8.8 MG/DL (ref 8.4–10.4)
Chloride: 107 MMOL/L (ref 98–107)
Creatinine: 0.9 MG/DL (ref 0.6–1.0)
GFR est AA: >60 mL/min/{1.73_m2} (ref >60)
GFR est non-AA: >60 mL/min/{1.73_m2} (ref >60)
Glucose: 77 MG/DL (ref 65–100)
Potassium: 3.5 MMOL/L (ref 3.5–5.1)
Sodium: 139 MMOL/L (ref 136–145)

## 2010-04-16 LAB — CBC WITH AUTOMATED DIFF
ABS. BASOPHILS: 0 10*3/uL (ref 0.0–0.2)
ABS. EOSINOPHILS: 0.1 10*3/uL (ref 0.0–0.8)
ABS. IMM. GRANS.: 0 10*3/uL (ref 0.0–2.0)
ABS. LYMPHOCYTES: 3.2 10*3/uL (ref 0.5–4.6)
ABS. MONOCYTES: 0.5 10*3/uL (ref 0.1–1.3)
ABS. NEUTROPHILS: 4 10*3/uL (ref 1.7–8.2)
BASOPHILS: 1 % (ref 0.0–2.0)
EOSINOPHILS: 1 % (ref 0.5–7.8)
HCT: 36.5 % — ABNORMAL LOW (ref 37.6–48.3)
HGB: 11.2 g/dL — ABNORMAL LOW (ref 11.7–15.0)
IMMATURE GRANULOCYTES: 0.3 % (ref 0.0–2.0)
LYMPHOCYTES: 41 % (ref 13–44)
MCH: 24.4 PG — ABNORMAL LOW (ref 26.1–32.9)
MCHC: 30.7 g/dL — ABNORMAL LOW (ref 31.4–35.0)
MCV: 79.5 FL — ABNORMAL LOW (ref 79.6–97.8)
MONOCYTES: 6 % (ref 4.0–12.0)
MPV: 9.4 FL — ABNORMAL LOW (ref 10.8–14.1)
NEUTROPHILS: 51 % (ref 43–78)
PLATELET: 250 10*3/uL (ref 140–440)
RBC: 4.59 M/uL (ref 3.86–5.18)
RDW: 16.8 % — ABNORMAL HIGH (ref 11.9–14.6)
WBC: 7.8 10*3/uL (ref 4.0–10.5)

## 2010-04-16 MED ORDER — FERROUS SULFATE SR 47.5 MG (IRON) TAB
47.5 mg iron | ORAL_TABLET | Freq: Every day | ORAL | Status: AC
Start: 2010-04-16 — End: 2010-05-16

## 2010-04-16 MED ORDER — ALBUTEROL SULFATE 0.083 % (0.83 MG/ML) SOLN FOR INHALATION
2.5 mg /3 mL (0.083 %) | Freq: Once | RESPIRATORY_TRACT | Status: AC
Start: 2010-04-16 — End: 2010-04-16
  Administered 2010-04-16: 16:00:00 via RESPIRATORY_TRACT

## 2010-04-16 MED ORDER — ONDANSETRON (PF) 4 MG/2 ML INJECTION
4 mg/2 mL | Freq: Once | INTRAMUSCULAR | Status: AC
Start: 2010-04-16 — End: 2010-04-16
  Administered 2010-04-16: 16:00:00 via INTRAVENOUS

## 2010-04-16 MED ORDER — HYOSCYAMINE 0.125 MG SUBLINGUAL TAB
0.125 mg | SUBLINGUAL | Status: AC
Start: 2010-04-16 — End: 2010-04-16
  Administered 2010-04-16: 16:00:00 via SUBLINGUAL

## 2010-04-16 MED ORDER — HYOSCYAMINE 0.125 MG SUBLINGUAL TAB
0.125 mg | ORAL_TABLET | SUBLINGUAL | Status: AC | PRN
Start: 2010-04-16 — End: 2011-04-11

## 2010-04-16 MED ORDER — ALBUTEROL 90 MCG/ACTUATION AEROSOL INHALER
90 mcg/actuation | RESPIRATORY_TRACT | Status: DC | PRN
Start: 2010-04-16 — End: 2021-03-06

## 2010-04-16 MED ORDER — SODIUM CHLORIDE 0.9% BOLUS IV
0.9 % | INTRAVENOUS | Status: AC
Start: 2010-04-16 — End: 2010-04-16
  Administered 2010-04-16: 16:00:00 via INTRAVENOUS

## 2010-04-16 MED FILL — HYOMAX-SL 0.125 MG SUBLINGUAL TABLET: 0.125 mg | SUBLINGUAL | Qty: 1

## 2010-04-16 MED FILL — ONDANSETRON (PF) 4 MG/2 ML INJECTION: 4 mg/2 mL | INTRAMUSCULAR | Qty: 2

## 2010-04-16 MED FILL — ALBUTEROL SULFATE 0.083 % (0.83 MG/ML) SOLN FOR INHALATION: 2.5 mg /3 mL (0.083 %) | RESPIRATORY_TRACT | Qty: 2

## 2010-04-16 NOTE — ED Provider Notes (Signed)
Patient is a 49 y.o. female presenting with fatigue. The history is provided by the patient. No language interpreter was used.   Fatigue  This is a new problem. The current episode started 2 days ago. The problem occurs constantly. The problem has been gradually worsening. Associated symptoms include shortness of breath. Pertinent negatives include no chest pain, no abdominal pain and no headaches. diarrhea. Nothing aggravates the symptoms. Nothing relieves the symptoms. She has tried nothing for the symptoms.        Past Medical History   Diagnosis Date   ??? COPD    ??? Hypertension    ??? Psychiatric disorder      depression, anxiety          Past Surgical History   Procedure Date   ??? Hx gyn      BTL           No family history on file.     History   Social History   ??? Marital Status: Single     Spouse Name: N/A     Number of Children: N/A   ??? Years of Education: N/A   Occupational History   ??? Not on file.   Social History Main Topics   ??? Smoking status: Current Everyday Smoker -- 1.5 packs/day   ??? Smokeless tobacco: Not on file   ??? Alcohol Use: No   ??? Drug Use:    ??? Sexually Active:    Other Topics Concern   ??? Not on file   Social History Narrative   ??? No narrative on file                    ALLERGIES: Review of patient's allergies indicates no known allergies.      Review of Systems   Constitutional: Positive for fatigue.   Respiratory: Positive for shortness of breath.    Cardiovascular: Negative for chest pain.   Gastrointestinal: Positive for diarrhea. Negative for abdominal pain.   Neurological: Negative for headaches.   All other systems reviewed and are negative.        Filed Vitals:    04/16/10 1027   BP: 149/77   Pulse: 87   Temp: 97.7 ??F (36.5 ??C)   Resp: 18   Height: 5\' 5"  (1.651 m)   Weight: 143 lb (64.864 kg)   SpO2: 99%              Physical Exam   Nursing note and vitals reviewed.  Constitutional: She is oriented to person, place, and time. She appears well-developed and well-nourished.   HENT:    Head: Normocephalic and atraumatic.   Right Ear: External ear normal.   Left Ear: External ear normal.   Nose: Nose normal.   Mouth/Throat: Oropharynx is clear and moist.   Eyes: Conjunctivae and extraocular motions are normal. Pupils are equal, round, and reactive to light.   Neck: Normal range of motion. Neck supple.   Cardiovascular: Normal rate, regular rhythm, normal heart sounds and intact distal pulses.    Pulmonary/Chest: Effort normal. No respiratory distress. She has wheezes.   Abdominal: Soft. Bowel sounds are normal.   Musculoskeletal: Normal range of motion. She exhibits no edema and no tenderness.   Neurological: She is alert and oriented to person, place, and time. No cranial nerve deficit. She exhibits normal muscle tone.   Skin: Skin is warm and dry.   Psychiatric: She has a normal mood and affect. Her behavior is normal. Judgment and  thought content normal.        MDM    Procedures

## 2010-04-16 NOTE — ED Notes (Signed)
I have reviewed discharge instructions with the patient.  The patient verbalized understanding.

## 2010-04-18 NOTE — ED Notes (Signed)
Client tells me I think I have a fever but do not have a thermometer, "no way I can go to work tomorrow feeling like this"

## 2010-04-18 NOTE — ED Notes (Signed)
Client was here Tuesday for breathing treatment and "iron was real low but it is getting worse"

## 2010-04-18 NOTE — ED Provider Notes (Signed)
HPI Comments: Elizabeth Ingram is a 49 y.o. Caucasian female in NAD returns to the ED stating she just doesn't feel good. Pt states she cont to have cough, leg cramps and abd pain. No fever or chills. No N&V&D. She denies Urinary symptoms    Patient is a 49 y.o. female presenting with abdominal pain, leg pain, and lethargy. The history is provided by the patient.   Abdominal Pain   This is a new problem. The current episode started more than 2 days ago. The problem occurs constantly. The problem has not changed since onset. The pain is associated with an unknown factor. The pain is located in the generalized abdominal region. The quality of the pain is cramping. The pain is at a severity of 8/10. Associated symptoms include myalgias. Pertinent negatives include no fever, no diarrhea, no nausea, no vomiting, no constipation, no dysuria, no frequency, no headaches, no arthralgias, no chest pain and no back pain. The pain is worsened by palpation. The pain is relieved by nothing.   Leg Pain   This is a recurrent problem. The current episode started more than 2 days ago. The problem occurs daily. The problem has not changed since onset. The pain is present in the left upper leg. The quality of the pain is described as intermittent and sharp. The pain is at a severity of 8/10. Pertinent negatives include no numbness, no limited range of motion, no stiffness, no tingling, no back pain and no neck pain. She has tried nothing for the symptoms. There has been no history of extremity trauma.   Lethargy  This is a new problem. The current episode started 2 days ago. The problem occurs daily. The problem has been gradually worsening. Associated symptoms include abdominal pain and shortness of breath. Pertinent negatives include no chest pain and no headaches. Nothing aggravates the symptoms. Nothing relieves the symptoms.        Past Medical History   Diagnosis Date   ??? COPD    ??? Hypertension    ??? Psychiatric disorder       depression, anxiety          Past Surgical History   Procedure Date   ??? Hx gyn      BTL   ??? Neurological procedure unlisted      nerve in left arm surgery 2009           No family history on file.     History   Social History   ??? Marital Status: Single     Spouse Name: N/A     Number of Children: N/A   ??? Years of Education: N/A   Occupational History   ??? Not on file.   Social History Main Topics   ??? Smoking status: Current Everyday Smoker -- 1.5 packs/day   ??? Smokeless tobacco: Not on file   ??? Alcohol Use: No   ??? Drug Use: No   ??? Sexually Active: Yes   Other Topics Concern   ??? Not on file   Social History Narrative   ??? No narrative on file                    ALLERGIES: Review of patient's allergies indicates no known allergies.      Review of Systems   Constitutional: Negative for fever, chills and fatigue.   HENT: Negative for neck pain and neck stiffness.    Respiratory: Positive for cough and shortness of breath.  Cardiovascular: Negative for chest pain and leg swelling.   Gastrointestinal: Positive for abdominal pain. Negative for nausea, vomiting, diarrhea and constipation.   Genitourinary: Negative for dysuria and frequency.   Musculoskeletal: Positive for myalgias. Negative for back pain and arthralgias.   Skin: Negative for rash.   Neurological: Positive for weakness. Negative for tingling, numbness and headaches.   All other systems reviewed and are negative.        Filed Vitals:    04/18/10 2120   BP: 132/87   Pulse: 87   Temp: 98.1 ??F (36.7 ??C)   Resp: 20   Height: 5\' 5"  (1.651 m)   Weight: 140 lb (63.504 kg)   SpO2: 100%              Physical Exam   Nursing note and vitals reviewed.  Constitutional: She is oriented to person, place, and time. She appears well-developed and well-nourished. No distress.   HENT:   Head: Normocephalic and atraumatic.   Right Ear: External ear normal.   Left Ear: External ear normal.   Nose: Nose normal.   Mouth/Throat: Oropharynx is clear and moist.    Eyes: Conjunctivae and extraocular motions are normal. No scleral icterus.   Neck: Normal range of motion. Neck supple.   Cardiovascular: Normal rate, regular rhythm, normal heart sounds, intact distal pulses and normal pulses.  Exam reveals no gallop.    No murmur heard.  Pulmonary/Chest: Tachypnea noted. No respiratory distress. She has decreased breath sounds. She has no wheezes. She has no rhonchi. She has no rales.   Abdominal: Soft. Normal appearance. There is no organomegaly, splenomegaly or hepatomegaly. Generalized and tenderness is present. She has no rigidity, no rebound, no guarding and no CVA tenderness.   Musculoskeletal: Normal range of motion. She exhibits no tenderness.   Neurological: She is alert and oriented to person, place, and time. She has normal strength. No cranial nerve deficit or sensory deficit. She displays a negative Romberg sign.   Skin: Skin is warm and dry. No rash noted.   Psychiatric: Her speech is normal. Judgment and thought content normal. She is slowed. Cognition and memory are normal. She exhibits a depressed mood.        MDM    Procedures    Recent Results (from the past 24 hour(s))   URINE MICROSCOPIC    Collection Time    04/18/10 10:05 PM   Component Value Range   ??? WBC 0  0 (/HPF)   ??? RBC 0-3  0 (/HPF)   ??? Epithelial cells 10-20  0 (/HPF)   ??? Bacteria 0  0 (/HPF)   ??? Casts 0  0 (/LPF)   ??? Crystals 0  0 (/LPF)   ??? Mucus 0  0 (/LPF)   CBC WITH AUTOMATED DIFF    Collection Time    04/18/10 10:10 PM   Component Value Range   ??? WBC 11.4 (*) 4.0 - 10.5 (K/uL)   ??? RBC 4.50  3.86 - 5.18 (M/uL)   ??? HGB 11.0 (*) 11.7 - 15.0 (Jericha Bryden/dL)   ??? HCT 35.7 (*) 37.6 - 48.3 (%)   ??? MCV 79.3 (*) 79.6 - 97.8 (FL)   ??? MCH 24.4 (*) 26.1 - 32.9 (PG)   ??? MCHC 30.8 (*) 31.4 - 35.0 (Thressa Shiffer/dL)   ??? RDW 16.6 (*) 11.9 - 14.6 (%)   ??? PLATELET 229  140 - 440 (K/uL)   ??? MPV 9.6 (*) 10.8 - 14.1 (FL)   ??? DF  AUTOMATED     ??? NEUTROPHILS 57  43 - 78 (%)   ??? LYMPHOCYTES 37  13 - 44 (%)   ??? MONOCYTES 5  4.0 - 12.0 (%)    ??? EOSINOPHILS 1  0.5 - 7.8 (%)   ??? BASOPHILS 0  0.0 - 2.0 (%)   ??? ABSOLUTE NEUTS 6.4  1.7 - 8.2 (K/UL)   ??? ABSOLUTE LYMPHS 4.2  0.5 - 4.6 (K/UL)   ??? ABSOLUTE MONOS 0.6  0.1 - 1.3 (K/UL)   ??? ABSOLUTE EOSINS 0.1  0.0 - 0.8 (K/UL)   ??? ABSOLUTE BASOS 0.0  0.0 - 0.2 (K/UL)   ??? IMM. GRANS. 0.3  0.0 - 2.0 (%)   ??? ABS. IMM. GRANS. 0.0  0.0 - 2.0 (K/UL)   METABOLIC PANEL, COMPREHENSIVE    Collection Time    04/18/10 10:10 PM   Component Value Range   ??? Sodium 136  136 - 145 (MMOL/L)   ??? Potassium 3.4 (*) 3.5 - 5.1 (MMOL/L)   ??? Chloride 106  98 - 107 (MMOL/L)   ??? CO2 22  21 - 32 (MMOL/L)   ??? Anion gap 8  7 - 16 (mmol/L)   ??? Glucose 77  65 - 100 (MG/DL)   ??? BUN 5 (*) 7 - 18 (MG/DL)   ??? Creatinine 0.8  0.6 - 1.0 (MG/DL)   ??? GFR est AA >60  >60 (ml/min/1.71m2)   ??? GFR est non-AA >60  >60 (ml/min/1.4m2)   ??? Calcium 8.7  8.4 - 10.4 (MG/DL)   ??? Bilirubin, total 0.3  0.2 - 1.1 (MG/DL)   ??? ALT 29 (*) 39 - 65 (U/L)   ??? AST 40 (*) 15 - 37 (U/L)   ??? Alk. phosphatase 104  50 - 136 (U/L)   ??? Protein, total 7.3  6.3 - 8.2 (Paityn Balsam/dL)   ??? Albumin 3.6  3.5 - 5.0 (Biana Haggar/dL)   ??? Globulin 3.7 (*) 2.3 - 3.5 (Polly Barner/dL)   ??? A-Leightyn Cina Ratio 1.0 (*) 1.2 - 3.5 ( )   MAGNESIUM    Collection Time    04/18/10 10:10 PM   Component Value Range   ??? Magnesium 1.6 (*) 1.8 - 2.4 (MG/DL)        CXR is neg for acute changes

## 2010-04-19 LAB — METABOLIC PANEL, COMPREHENSIVE
A-G Ratio: 1 — ABNORMAL LOW (ref 1.2–3.5)
ALT (SGPT): 29 U/L — ABNORMAL LOW (ref 39–65)
AST (SGOT): 40 U/L — ABNORMAL HIGH (ref 15–37)
Albumin: 3.6 g/dL (ref 3.5–5.0)
Alk. phosphatase: 104 U/L (ref 50–136)
Anion gap: 8 mmol/L (ref 7–16)
BUN: 5 MG/DL — ABNORMAL LOW (ref 7–18)
Bilirubin, total: 0.3 MG/DL (ref 0.2–1.1)
CO2: 22 MMOL/L (ref 21–32)
Calcium: 8.7 MG/DL (ref 8.4–10.4)
Chloride: 106 MMOL/L (ref 98–107)
Creatinine: 0.8 MG/DL (ref 0.6–1.0)
GFR est AA: >60 mL/min/{1.73_m2} (ref >60)
GFR est non-AA: >60 mL/min/{1.73_m2} (ref >60)
Globulin: 3.7 g/dL — ABNORMAL HIGH (ref 2.3–3.5)
Glucose: 77 MG/DL (ref 65–100)
Potassium: 3.4 MMOL/L — ABNORMAL LOW (ref 3.5–5.1)
Protein, total: 7.3 g/dL (ref 6.3–8.2)
Sodium: 136 MMOL/L (ref 136–145)

## 2010-04-19 LAB — CBC WITH AUTOMATED DIFF
ABS. BASOPHILS: 0 10*3/uL (ref 0.0–0.2)
ABS. EOSINOPHILS: 0.1 10*3/uL (ref 0.0–0.8)
ABS. IMM. GRANS.: 0 10*3/uL (ref 0.0–2.0)
ABS. LYMPHOCYTES: 4.2 10*3/uL (ref 0.5–4.6)
ABS. MONOCYTES: 0.6 10*3/uL (ref 0.1–1.3)
ABS. NEUTROPHILS: 6.4 10*3/uL (ref 1.7–8.2)
BASOPHILS: 0 % (ref 0.0–2.0)
EOSINOPHILS: 1 % (ref 0.5–7.8)
HCT: 35.7 % — ABNORMAL LOW (ref 37.6–48.3)
HGB: 11 g/dL — ABNORMAL LOW (ref 11.7–15.0)
IMMATURE GRANULOCYTES: 0.3 % (ref 0.0–2.0)
LYMPHOCYTES: 37 % (ref 13–44)
MCH: 24.4 PG — ABNORMAL LOW (ref 26.1–32.9)
MCHC: 30.8 g/dL — ABNORMAL LOW (ref 31.4–35.0)
MCV: 79.3 FL — ABNORMAL LOW (ref 79.6–97.8)
MONOCYTES: 5 % (ref 4.0–12.0)
MPV: 9.6 FL — ABNORMAL LOW (ref 10.8–14.1)
NEUTROPHILS: 57 % (ref 43–78)
PLATELET: 229 10*3/uL (ref 140–440)
RBC: 4.5 M/uL (ref 3.86–5.18)
RDW: 16.6 % — ABNORMAL HIGH (ref 11.9–14.6)
WBC: 11.4 10*3/uL — ABNORMAL HIGH (ref 4.0–10.5)

## 2010-04-19 LAB — MAGNESIUM: Magnesium: 1.6 MG/DL — ABNORMAL LOW (ref 1.8–2.4)

## 2010-04-19 LAB — URINE MICROSCOPIC
Bacteria: 0 /HPF
Casts: 0 /LPF
Crystals, urine: 0 /LPF
Mucus: 0 /LPF
WBC: 0 /HPF

## 2010-04-19 MED ORDER — POTASSIUM CHLORIDE 10 % ORAL LIQUID
20 mEq/15 mL | ORAL | Status: AC
Start: 2010-04-19 — End: 2010-04-18
  Administered 2010-04-19: 03:00:00 via ORAL

## 2010-04-19 MED ORDER — SODIUM CHLORIDE 0.9% BOLUS IV
0.9 % | Freq: Once | INTRAVENOUS | Status: AC
Start: 2010-04-19 — End: 2010-04-18
  Administered 2010-04-19: 02:00:00 via INTRAVENOUS

## 2010-04-19 MED ORDER — MEPERIDINE (PF) 50 MG/ML INJ SOLN
50 mg/ml | INTRAMUSCULAR | Status: AC
Start: 2010-04-19 — End: 2010-04-18
  Administered 2010-04-19: 04:00:00 via INTRAVENOUS

## 2010-04-19 MED ORDER — ONDANSETRON (PF) 4 MG/2 ML INJECTION
4 mg/2 mL | INTRAMUSCULAR | Status: AC
Start: 2010-04-19 — End: 2010-04-18
  Administered 2010-04-19: 04:00:00 via INTRAVENOUS

## 2010-04-19 MED ORDER — MAGNESIUM SULFATE IN D5W 1 GRAM/100 ML IV PIGGY BACK
1 gram/00 mL | INTRAVENOUS | Status: AC
Start: 2010-04-19 — End: 2010-04-18
  Administered 2010-04-19: 03:00:00 via INTRAVENOUS

## 2010-04-19 MED FILL — MAGNESIUM SULFATE IN D5W 1 GRAM/100 ML IV PIGGY BACK: 1 gram/00 mL | INTRAVENOUS | Qty: 100

## 2010-04-19 MED FILL — POTASSIUM CHLORIDE 10 % ORAL LIQUID: 20 mEq/15 mL | ORAL | Qty: 15

## 2010-04-19 MED FILL — DEMEROL (PF) 50 MG/ML INJECTION SYRINGE: 50 mg/mL | INTRAMUSCULAR | Qty: 1

## 2010-04-19 MED FILL — ONDANSETRON (PF) 4 MG/2 ML INJECTION: 4 mg/2 mL | INTRAMUSCULAR | Qty: 2

## 2010-04-19 NOTE — ED Notes (Signed)
I have reviewed discharge instructions with the patient.  The patient verbalized understanding.  Ambulated out with no acute distress noted.

## 2014-06-06 LAB — URINE MICROSCOPIC
Casts: 0 /lpf
Crystals, urine: 0 /LPF
Mucus: 0 /lpf
RBC: 0 /hpf

## 2014-06-06 MED ORDER — METHYLPREDNISOLONE (PF) 125 MG/2 ML IJ SOLR
125 mg/2 mL | Freq: Once | INTRAMUSCULAR | Status: AC
Start: 2014-06-06 — End: 2014-06-06
  Administered 2014-06-06: 21:00:00 via INTRAMUSCULAR

## 2014-06-06 MED ORDER — HYDROCODONE-ACETAMINOPHEN 5 MG-325 MG TAB
5-325 mg | ORAL_TABLET | Freq: Four times a day (QID) | ORAL | Status: DC | PRN
Start: 2014-06-06 — End: 2017-11-24

## 2014-06-06 MED ORDER — PREDNISONE 10 MG TABLETS IN A DOSE PACK
10 mg | ORAL_TABLET | ORAL | Status: DC
Start: 2014-06-06 — End: 2017-11-24

## 2014-06-06 MED ORDER — DIAZEPAM 5 MG TAB
5 mg | ORAL | Status: AC
Start: 2014-06-06 — End: 2014-06-06
  Administered 2014-06-06: 21:00:00 via ORAL

## 2014-06-06 MED ORDER — DIAZEPAM 5 MG TAB
5 mg | ORAL_TABLET | Freq: Three times a day (TID) | ORAL | Status: DC | PRN
Start: 2014-06-06 — End: 2017-11-24

## 2014-06-06 MED ORDER — HYDROMORPHONE (PF) 1 MG/ML IJ SOLN
1 mg/mL | INTRAMUSCULAR | Status: AC
Start: 2014-06-06 — End: 2014-06-06
  Administered 2014-06-06: 21:00:00 via INTRAMUSCULAR

## 2014-06-06 MED FILL — SOLU-MEDROL (PF) 125 MG/2 ML SOLUTION FOR INJECTION: 125 mg/2 mL | INTRAMUSCULAR | Qty: 2

## 2014-06-06 MED FILL — HYDROMORPHONE (PF) 1 MG/ML IJ SOLN: 1 mg/mL | INTRAMUSCULAR | Qty: 1

## 2014-06-06 MED FILL — DIAZEPAM 5 MG TAB: 5 mg | ORAL | Qty: 1

## 2014-06-06 NOTE — ED Notes (Addendum)
Pt st back pain that radiates into her right leg. Pt st she fell yesterday. Pt st history of chronic back pain.

## 2014-06-06 NOTE — ED Notes (Signed)
I have reviewed discharge instructions with the patient.  The patient verbalized understanding.  Prescriptions given.  Patient ambulated out with no acute distress noted.

## 2014-06-06 NOTE — ED Provider Notes (Signed)
HPI Comments: Pt. States she was walking yesterday and her foot slipped and she fell down hitting her right lower back area on curb. She states she was able to stand up and continue walking after that but started to have more pain today. Pt. Is S/P L2-L5 spinal fusion at [facility] with Dr. Yetta Flock. She denies bowel or bladder incontinence. Pt. States she has long standing chronic low back pain. Pt. Was observed to ambulate up hill to ER and into ER without difficulty. As soon as she was at the door of ER she was observed to lean forward and started moaning and groaning and told staff that she just couldn't walk and needed a wheelchair. Significant other showed up and started to create a stir and both were escorted to hallway bed N.    Patient is a 53 y.o. female presenting with back pain. The history is provided by the patient.   Back Pain   This is a chronic problem. The current episode started more than 1 week ago (Several years ago). The problem has not changed since onset.The problem occurs daily. The pain is present in the lumbar spine. The quality of the pain is described as stabbing, shooting and sharp. The pain does not radiate. The pain is severe. The symptoms are aggravated by bending, twisting and certain positions. The pain is the same all the time. Stiffness is present all day. Pertinent negatives include no chest pain, no fever, no numbness, no headaches, no abdominal pain, no abdominal swelling, no bowel incontinence, no perianal numbness, no bladder incontinence, no dysuria, no pelvic pain, no leg pain, no paresthesias, no tingling and no weakness. She has tried nothing for the symptoms. Risk factors include lack of exercise and a sedentary lifestyle.        Past Medical History   Diagnosis Date   ??? COPD    ??? Hypertension    ??? Psychiatric disorder      depression, anxiety        Past Surgical History   Procedure Laterality Date   ??? Hx gyn       BTL   ??? Pr neurological procedure unlisted        nerve in left arm surgery 2009         History reviewed. No pertinent family history.     History     Social History   ??? Marital Status: SINGLE     Spouse Name: N/A     Number of Children: N/A   ??? Years of Education: N/A     Occupational History   ??? Not on file.     Social History Main Topics   ??? Smoking status: Current Every Day Smoker -- 1.50 packs/day   ??? Smokeless tobacco: Not on file   ??? Alcohol Use: No   ??? Drug Use: No   ??? Sexual Activity: Yes     Other Topics Concern   ??? Not on file     Social History Narrative                  ALLERGIES: Neurontin      Review of Systems   Constitutional: Negative for fever, chills, diaphoresis and fatigue.   Respiratory: Negative for cough, chest tightness, shortness of breath and wheezing.    Cardiovascular: Negative for chest pain and palpitations.   Gastrointestinal: Negative for nausea, vomiting, abdominal pain, diarrhea, abdominal distention and bowel incontinence.   Genitourinary: Negative for bladder incontinence, dysuria, urgency, hematuria, flank  pain, decreased urine volume, difficulty urinating and pelvic pain.   Musculoskeletal: Positive for back pain. Negative for myalgias, joint swelling, arthralgias, gait problem, neck pain and neck stiffness.   Neurological: Negative for dizziness, tingling, tremors, syncope, weakness, light-headedness, numbness, headaches and paresthesias.   All other systems reviewed and are negative.      Filed Vitals:    06/06/14 1604   BP: 152/77   Pulse: 105   Temp: 98.1 ??F (36.7 ??C)   Resp: 16   Height: 5\' 5"  (1.651 m)   Weight: 70.308 kg (155 lb)   SpO2: 98%            Physical Exam   Constitutional: She is oriented to person, place, and time. Vital signs are normal. She appears well-developed and well-nourished. She is active.  Non-toxic appearance. She does not appear ill. No distress.   Disheveled with strong odor of cigarette smoke. She is rolling around on exam stretcher moaning and groaning and crying out for help.   HENT:    Head: Normocephalic and atraumatic.   Mouth/Throat: Oropharynx is clear and moist.   Eyes: Conjunctivae and EOM are normal. Pupils are equal, round, and reactive to light.   Neck: Normal range of motion. Neck supple. No spinous process tenderness and no muscular tenderness present.   Cardiovascular: Normal rate, regular rhythm, normal heart sounds and intact distal pulses.  Exam reveals no gallop and no friction rub.    No murmur heard.  Pulmonary/Chest: Effort normal and breath sounds normal. No respiratory distress. She has no decreased breath sounds. She has no wheezes. She has no rhonchi.   Abdominal: Soft. Normal appearance and bowel sounds are normal. She exhibits no distension, no abdominal bruit, no pulsatile midline mass and no mass. There is no hepatosplenomegaly. There is no tenderness. There is no rigidity, no rebound, no guarding and no CVA tenderness.   Musculoskeletal:        Right hip: Normal.        Left hip: Normal.        Right knee: Normal. She exhibits normal range of motion. No tenderness found.        Left knee: Normal. She exhibits normal range of motion. No tenderness found.        Cervical back: Normal. She exhibits normal range of motion, no bony tenderness and no spasm.        Thoracic back: Normal. She exhibits normal range of motion, no bony tenderness and no spasm.        Lumbar back: She exhibits decreased range of motion, tenderness, bony tenderness and spasm. She exhibits no swelling and no deformity.        Back:    Lymphadenopathy:     She has no cervical adenopathy.   Neurological: She is alert and oriented to person, place, and time. She has normal strength and normal reflexes. No sensory deficit. Coordination and gait normal.   Skin: Skin is warm, dry and intact.   Psychiatric: Her speech is normal. Her mood appears anxious. Her affect is angry. She is agitated and combative.   Nursing note and vitals reviewed.       MDM  Number of Diagnoses or Management Options   Back muscle spasm:   Chronic low back pain:   Low back strain, initial encounter:   Diagnosis management comments: Upon walking out of ER pt. Was observed to immediately straighten her spine up and was walking briskly without difficulty.  Amount and/or Complexity of Data Reviewed  Clinical lab tests: ordered and reviewed  Tests in the radiology section of CPT??: ordered and reviewed    Risk of Complications, Morbidity, and/or Mortality  Presenting problems: low  Diagnostic procedures: low  Management options: low  General comments: Pt. States improvement after being medicated for pain. I discussed with her that she will need to schedule follow up appointment with her neurosurgeon as well as pain management. She verbalized understanding.        Procedures    LS Spine X-ray: Surgical changes.  2. Multilevel disc and bony degenerative changes. No acute finding.    Right Hip X-ray: Surgical changes.  2. Multilevel disc and bony degenerative changes. No acute finding.    Recent Results (from the past 24 hour(s))   URINE MICROSCOPIC    Collection Time: 06/06/14  5:00 PM   Result Value Ref Range    WBC 5-10 0 /hpf    RBC 0 0 /hpf    Epithelial cells 5-10 0 /hpf    Bacteria 1+ (H) 0 /hpf    Casts 0 0 /lpf    Crystals 0 0 /LPF    Mucus 0 0 /lpf     I discussed the results of all labs, procedures, radiographs, and treatments with the patient and available family. Treatment plan is agreed upon and the patient is ready for discharge. All voiced understanding of the discharge plan and medication instructions or changes as appropriate. Questions about treatment in the ED were answered. All were encouraged to return should symptoms worsen or new problems develop.

## 2014-06-06 NOTE — ED Notes (Signed)
Patient denies any relief from pain.  States the pain is worse.  States she feels the shots made the pain worse.

## 2014-07-28 LAB — URINE MICROSCOPIC
Bacteria: 0 /hpf
WBC: >100 /hpf — ABNORMAL HIGH

## 2014-07-28 LAB — HCG URINE, QL. - POC: Pregnancy test,urine (POC): NEGATIVE

## 2014-07-28 MED ORDER — NITROFURANTOIN MACROCRYSTAL 50 MG CAP
50 mg | ORAL | Status: AC
Start: 2014-07-28 — End: 2014-07-28
  Administered 2014-07-28: 17:00:00 via ORAL

## 2014-07-28 MED ORDER — OXYCODONE-ACETAMINOPHEN 7.5 MG-325 MG TAB
ORAL | Status: AC
Start: 2014-07-28 — End: 2014-07-28
  Administered 2014-07-28: 17:00:00 via ORAL

## 2014-07-28 MED ORDER — PHENAZOPYRIDINE 200 MG TAB
200 mg | ORAL | Status: AC
Start: 2014-07-28 — End: 2014-07-28
  Administered 2014-07-28: 17:00:00 via ORAL

## 2014-07-28 MED ORDER — PHENAZOPYRIDINE 200 MG TAB
200 mg | ORAL_TABLET | Freq: Three times a day (TID) | ORAL | Status: AC
Start: 2014-07-28 — End: 2014-07-30

## 2014-07-28 MED ORDER — NITROFURANTOIN (25% MACROCRYSTAL FORM) 100 MG CAP
100 mg | ORAL_CAPSULE | Freq: Two times a day (BID) | ORAL | Status: AC
Start: 2014-07-28 — End: 2014-08-07

## 2014-07-28 MED FILL — OXYCODONE-ACETAMINOPHEN 7.5 MG-325 MG TAB: ORAL | Qty: 1

## 2014-07-28 MED FILL — NITROFURANTOIN MACROCRYSTAL 50 MG CAP: 50 mg | ORAL | Qty: 2

## 2014-07-28 MED FILL — PHENAZOPYRIDINE 200 MG TAB: 200 mg | ORAL | Qty: 1

## 2014-07-28 NOTE — ED Notes (Signed)
Pt resting in bed NAD.   Respirations even and unlabored.  Pt with no complaints at this time.

## 2014-07-28 NOTE — ED Provider Notes (Signed)
HPI Comments: Patient states she started with urinary frequency and burning about 2 days ago.  She has had urinary tract infections before in her life and this is what it feels like.  She has not had any nausea, vomiting, fever, diarrhea or constipation.  [facility] and needs to renew her sponsorship with the hospital so she can follow up there.  She is also trying to get disability for chronic back pain.  She came via EMS today and is going to have her sister pick her up.    Patient is a 53 y.o. female presenting with urinary pain. The history is provided by the patient.   Urinary Pain   This is a new problem. The current episode started 2 days ago. The problem occurs every urination. The problem has been gradually worsening. The quality of the pain is described as burning. The pain is at a severity of 7/10. The pain is moderate. There has been no fever. Associated symptoms include frequency, hesitancy, urgency and back pain. Pertinent negatives include no chills, no sweats, no nausea, no vomiting, no discharge, no hematuria, no flank pain, no vaginal discharge and no abdominal pain.        Past Medical History   Diagnosis Date   ??? COPD    ??? Hypertension    ??? Psychiatric disorder      depression, anxiety        Past Surgical History   Procedure Laterality Date   ??? Hx gyn       BTL   ??? Pr neurological procedure unlisted       nerve in left arm surgery 2009         History reviewed. No pertinent family history.     History     Social History   ??? Marital Status: SINGLE     Spouse Name: N/A     Number of Children: N/A   ??? Years of Education: N/A     Occupational History   ??? Not on file.     Social History Main Topics   ??? Smoking status: Current Every Day Smoker -- 0.50 packs/day   ??? Smokeless tobacco: Not on file   ??? Alcohol Use: No   ??? Drug Use: No   ??? Sexual Activity: Yes     Other Topics Concern   ??? Not on file     Social History Narrative                   ALLERGIES: Neurontin      Review of Systems   Constitutional: Negative.  Negative for chills.   HENT: Negative.    Eyes: Negative.    Respiratory: Negative.    Cardiovascular: Negative.    Gastrointestinal: Negative.  Negative for nausea, vomiting and abdominal pain.   Genitourinary: Positive for hesitancy, urgency and frequency. Negative for hematuria, flank pain and vaginal discharge.   Musculoskeletal: Positive for back pain.   Skin: Negative.    Neurological: Negative.    Psychiatric/Behavioral: Negative.    All other systems reviewed and are negative.      Filed Vitals:    07/28/14 1112   BP: 162/90   Pulse: 98   Temp: 97.8 ??F (36.6 ??C)   Resp: 18   Height: 5\' 5"  (1.651 m)   Weight: 72.576 kg (160 lb)   SpO2: 100%            Physical Exam  Constitutional: She is oriented to person, place, and time. She appears well-developed and well-nourished.   HENT:   Head: Normocephalic and atraumatic.   Right Ear: External ear normal.   Left Ear: External ear normal.   Nose: Nose normal.   Mouth/Throat: Oropharynx is clear and moist.   Eyes: Conjunctivae and EOM are normal. Pupils are equal, round, and reactive to light.   Neck: Normal range of motion. Neck supple.   Cardiovascular: Normal rate, regular rhythm, normal heart sounds and intact distal pulses.    Pulmonary/Chest: Effort normal and breath sounds normal.   Abdominal: Soft. Bowel sounds are normal.   Musculoskeletal: Normal range of motion.        Back:    Neurological: She is alert and oriented to person, place, and time. She has normal reflexes.   Skin: Skin is warm and dry.   Psychiatric: She has a normal mood and affect. Her behavior is normal. Judgment and thought content normal.   Nursing note and vitals reviewed.       MDM    Procedures    12:38 PM Spoke with Dr. Margie Ege regarding patient. Patient is on lisinopril/HCTZ and there is an interaction that increases potassium levels with bactrim DS. Will put patient of macrobid instead.    The patient was observed in the ED.    Results Reviewed:      Recent Results (from the past 24 hour(s))   HCG URINE, QL. - POC    Collection Time: 07/28/14 11:26 AM   Result Value Ref Range    Pregnancy test,urine (POC) NEGATIVE  NEG     URINE MICROSCOPIC    Collection Time: 07/28/14 11:37 AM   Result Value Ref Range    WBC >100 (H) 0 /hpf    RBC 5-10 0 /hpf    Epithelial cells 0-3 0 /hpf    Bacteria 0 0 /hpf    Casts 0-3 0 /lpf   Plenty of fluids, [facility].  Plenty of fluids.  ED if worse.    I discussed the results of all labs, procedures, radiographs, and treatments with the patient and available family.  Treatment plan is agreed upon and the patient is ready for discharge.  All voiced understanding of the discharge plan and medication instructions or changes as appropriate.  Questions about treatment in the ED were answered.  All were encouraged to return should symptoms worsen or new problems develop.

## 2014-07-28 NOTE — ED Notes (Signed)
Pt presents with c/o urinary frequency and pain such as burning urination. Pt denies hematuria

## 2014-07-28 NOTE — ED Notes (Signed)
The discharge instructions, follow-up information, medications, and prescriptions were reviewed with the patient. The patient verbalized understanding and all questions were answered. A copy of the discharge instructions were given to patient upon discharge. The patient was able to transport with no distress via ambulation and escorted by RN. The patient was reminded to not drive while under the influence of medications. MD notified of abnormal/normal vital signs. No orders received at this time. All LDAs removed prior to patient leaving the facility.    Patient signed paper discharge instructions. Signed copy placed on chart.

## 2014-07-31 LAB — CULTURE, URINE
Culture result:: <10000
Culture result:: >100000
Culture: <10000

## 2014-09-08 ENCOUNTER — Encounter: Attending: Family Medicine | Primary: Internal Medicine

## 2014-09-28 ENCOUNTER — Emergency Department: Payer: Self-pay | Primary: Internal Medicine

## 2014-09-28 ENCOUNTER — Inpatient Hospital Stay
Admit: 2014-09-28 | Discharge: 2014-09-28 | Disposition: A | Discharge status: Home / Self Care | Payer: Self-pay | Attending: Emergency Medicine

## 2014-09-28 DIAGNOSIS — M25562 Pain in left knee: Secondary | ICD-10-CM

## 2014-09-28 MED ORDER — OXYCODONE 5 MG TAB
5 mg | ORAL | Status: AC
Start: 2014-09-28 — End: 2014-09-28
  Administered 2014-09-28: 20:00:00 via ORAL

## 2014-09-28 MED ORDER — CYCLOBENZAPRINE 10 MG TAB
10 mg | ORAL_TABLET | Freq: Three times a day (TID) | ORAL | Status: DC | PRN
Start: 2014-09-28 — End: 2017-11-24

## 2014-09-28 MED ORDER — MELOXICAM 7.5 MG TAB
7.5 mg | ORAL_TABLET | Freq: Two times a day (BID) | ORAL | Status: DC
Start: 2014-09-28 — End: 2017-11-24

## 2014-09-28 MED ORDER — NAPROXEN 250 MG TAB
250 mg | ORAL | Status: AC
Start: 2014-09-28 — End: 2014-09-28
  Administered 2014-09-28: 20:00:00 via ORAL

## 2014-09-28 MED FILL — OXYCODONE 5 MG TAB: 5 mg | ORAL | Qty: 1

## 2014-09-28 MED FILL — NAPROXEN 250 MG TAB: 250 mg | ORAL | Qty: 2

## 2014-09-28 NOTE — ED Provider Notes (Addendum)
HPI Comments: Patient complains of medial left knee pain after reportedly falling while she was walking upstairs last night.  Patient states she's had chronic right hip/sciatic pain and two back surgeries within the last year. Pt reports having an MRI done at [facility] 3 weeks ago for her back pain.  She is expecting a call from the [facility] ortho clinic within the month.    Patient states she is taking 10-12 Advil throughout the day with no relief.  Patient has hepatitis C and cannot take Tylenol.  Pt denies change in back pain, no urinary retention, saddle anesthesia, no sensory-motor deficits in L foot since fall.   Pt reports she is waiting for hospital sponsorship to receive her other meds.      Patient is a 53 y.o. female presenting with fall.   Fall   Pertinent negatives include no fever, no nausea and no hematuria.        Past Medical History   Diagnosis Date   ??? COPD    ??? Hypertension    ??? Psychiatric disorder      depression, anxiety        Past Surgical History   Procedure Laterality Date   ??? Hx gyn       BTL   ??? Pr neurological procedure unlisted       nerve in left arm surgery 2009   ??? Hx orthopaedic           History reviewed. No pertinent family history.     History     Social History   ??? Marital Status: SINGLE     Spouse Name: N/A     Number of Children: N/A   ??? Years of Education: N/A     Occupational History   ??? Not on file.     Social History Main Topics   ??? Smoking status: Current Every Day Smoker -- 0.50 packs/day   ??? Smokeless tobacco: Not on file   ??? Alcohol Use: No   ??? Drug Use: No   ??? Sexual Activity: Yes     Other Topics Concern   ??? Not on file     Social History Narrative                  ALLERGIES: Neurontin      Review of Systems   Constitutional: Negative for fever, chills and fatigue.   Cardiovascular: Negative for leg swelling.   Gastrointestinal: Negative for nausea.   Genitourinary: Negative for dysuria, urgency, frequency, hematuria, vaginal bleeding and vaginal discharge.    Musculoskeletal: Positive for myalgias, back pain and arthralgias. Negative for gait problem.   Psychiatric/Behavioral: Negative for confusion.       Filed Vitals:    09/28/14 1307   BP: 168/89   Pulse: 97   Temp: 98.1 ??F (36.7 ??C)   Resp: 18   Height: 5\' 5"  (1.651 m)   Weight: 72.576 kg (160 lb)   SpO2: 99%            Physical Exam   Constitutional: She is oriented to person, place, and time. She appears well-developed and well-nourished. No distress.   Unfortunately, disheveled, slim white female   HENT:   Head: Normocephalic and atraumatic.   Eyes: Conjunctivae and EOM are normal. Pupils are equal, round, and reactive to light.   Neck: Normal range of motion. Neck supple.   Abdominal: Soft. Bowel sounds are normal. She exhibits no distension. There is no tenderness.   Musculoskeletal: She  exhibits tenderness.        Right hip: She exhibits decreased strength and tenderness. She exhibits normal range of motion, no swelling, no deformity and no laceration.        Left knee: She exhibits ecchymosis and erythema. She exhibits normal range of motion, no swelling, no effusion, no deformity, no laceration, normal alignment, no LCL laxity, normal patellar mobility, normal meniscus and no MCL laxity. Tenderness found. Medial joint line tenderness noted.        Legs:  Neurological: She is alert and oriented to person, place, and time.   Skin: Skin is warm and dry.   Nursing note and vitals reviewed.       MDM  Number of Diagnoses or Management Options  Left medial knee pain: new and does not require workup  Sciatica, right: established and worsening  Diagnosis management comments: Sciatica, lumbar strain, DDD, L knee contusion       Amount and/or Complexity of Data Reviewed  Tests in the radiology section of CPT??: ordered and reviewed        Procedures    The patient was observed in the ED.    Results Reviewed:    MRI Lumbar Spine with and without Contrast10/12/2013   [facility]   Result Narrative    ORDERING AV:WUJWJX HADDAD, MD, OP LOCATION:-    [address] [place], [place]      [id number] - MRI LUMBAR SPN W/WO CONTRAST   ACCESSION AO:[id number]  ORDERED BY: Dr. Jessica Priest HADDAD, MD,     Date of Exam: 09/07/2014 09:09  REASON FOR EXAM: Low back pain radiating down right leg------      Admission Date: 09/07/2014    I am comparing with lumbar spine MR from 10/10/2013.  The current   history islow back pain extending into the right leg.  The patient   information page   reports two prior lumbar surgical procedures.                                                                                     Sequences include sagittal T1, T2 and STIR images.  Axial T1 and   T2-weighted   images cover the last 4 disk levels.  Following IV administration of   15 mL   Magnevist for enhancement purposes, axial T1-weighted images were   repeated   and a sagittal T1 fat suppression sequence was obtained                                                                                     The vertebrae have normal height.  Each of the last 4 disk shows loss   of   height and signal. Signal variation is noted in the endplates   adjacent to   these discs consistent with osseous response to chronic  degenerative   disk   disease.  There has been some increase in the endplate signal   abnormality at   L2-3 and L3-4 levels in the last year                                                                                     Midline images show no severe stenoses.  Ventral canal encroachment   is again   noted at L2-3 and L3-4 levels.  Decompressive surgery is again   apparent at L2   through L4. The conus is unremarkable.  Parasagittal images again   show some   foraminal compromise bilaterally at L3-4 and L4-5 levels                                                                                     Axial views at L2-3 show asymmetry on the posterior margin of the disk  encroaching on the canal more to the left of midline.  There is no    severe   stenosis as the patient has had prior decompressive surgery here.    There is   mild foraminal stenosis on the left.  The appearance is not   significantly   changed in the last year                                                                                     L3-4, the posterior margin of the disk shows mild irregular contour.    This is   slightly changed by comparison with the prior exam and I am   suspicious of a   shallow herniation just to the right of midline.  This does not cause   any   significant stenosis through this region of decompressive surgery.    Foraminal   stenoses are evident right greater than left                                                                                     At L4-5, the disk shows diffuse annular bulge, and no convincing   evidence of   herniation on the current study.  There is no canal stenosis.  Foraminal   stenosis is moderate bilaterally                                                                                     L5-S1 level again shows asymmetry of the disk extending into the   canal more   to the right of midline and to the left.  Previously it contacted the   S1   nerve but it appears to have receded somewhat in the last year.    There is no   nerve root displacement or sac deformity and there is no significant  foraminal compromise                                                                                     Following contrast administration, there is enhancement around the   right L5   nerve at the level of the L4-5 disk consistent with epidural   fibrosis. The   L3-4 disk contour looks more concentric and less irregular following   contrast   administration suggesting that there is some scar tissue on the   posterior   margin of the disk.                                                                                     The paraspinal soft tissues included on the exam are unremarkable                                                                                                      Impression:                                                                        1. Compared with November 2014, asymmetric contour on the posterior   margin of   the L5-S1 disk has decreased.  I  do not see any definite impingement   on the   right S1 nerve on the current exam.  I believe that a disk herniation   has   receded in the interval.  2. In this patient with right leg symptoms, there is a fairly severe  foraminal stenosis on the right at L3-4.  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                                                                                                                                                                                                                                                           TRANSCRIPTIONIST:   sjr                                                            TRANSCRIBE TIME/DATE: 09/07/2014 11:07 am                                          READ BY:            Zenaida Niece, MD                                                                   ELECTRONICALLY SIGNED BY:                              09/07/2014 11:07 AM:  Zenaida Niece, MD        2:48 PM  Pt becomes tearful and partner aggressive- partner requests XR knee. I comply with request.    3:24 PM  Received call from radiology that pt refused the XR and "wants to  know if pain meds ordered."        No results found for this or any previous visit (from the past 24 hour(s)).       Patient with left knee pain reportedly after falling while walking upstairs last night.  History of chronic right hip pain and "locking up".  Patient states she is on the call list for the ortho clinic at [facility].  Will give anti-inflammatory RX and muscle relaxer, and ortho referral.  ACE wrap.    I discussed the results of all labs, procedures, radiographs, and  treatments with the patient and available family.  Treatment plan is agreed upon and the patient is ready for discharge.  All voiced understanding of the discharge plan and medication instructions or changes as appropriate.  Questions about treatment in the ED were answered.  All were encouraged to return should symptoms worsen or new problems develop.    I was personally available for consultation in the Emergency Department . I have reviewed the chart and agree with the documentation as recorded by the [facility] including the assessment, treatment plan and disposition.

## 2014-09-28 NOTE — Progress Notes (Signed)
Patient signed consent to participate in [facility] and referred to [facility].

## 2014-09-28 NOTE — ED Notes (Signed)
Pt refused xray.  I have reviewed discharge instructions with the patient.  The patient verbalized understanding. Prescriptions given times 2 and an ace wrap is applied.

## 2014-09-28 NOTE — ED Notes (Signed)
Fell last night.  Left knee bruising.  Reports since back surgery her hip "locks up".

## 2015-01-05 ENCOUNTER — Emergency Department (HOSPITAL_COMMUNITY)
Admission: EM | Admit: 2015-01-05 | Discharge: 2015-01-05 | Disposition: A | Discharge status: Home / Self Care | Payer: 59 | Attending: Emergency Medicine | Admitting: Emergency Medicine

## 2015-01-05 ENCOUNTER — Encounter (HOSPITAL_COMMUNITY): Payer: Self-pay | Admitting: *Deleted

## 2015-01-05 DIAGNOSIS — M545 Low back pain, unspecified: Secondary | ICD-10-CM

## 2015-01-05 DIAGNOSIS — D649 Anemia, unspecified: Secondary | ICD-10-CM | POA: Insufficient documentation

## 2015-01-05 DIAGNOSIS — F17210 Nicotine dependence, cigarettes, uncomplicated: Secondary | ICD-10-CM | POA: Insufficient documentation

## 2015-01-05 DIAGNOSIS — J449 Chronic obstructive pulmonary disease, unspecified: Secondary | ICD-10-CM | POA: Diagnosis not present

## 2015-01-05 DIAGNOSIS — G8929 Other chronic pain: Secondary | ICD-10-CM | POA: Diagnosis not present

## 2015-01-05 DIAGNOSIS — M549 Dorsalgia, unspecified: Secondary | ICD-10-CM | POA: Diagnosis present

## 2015-01-05 HISTORY — DX: Anemia, unspecified: D64.9

## 2015-01-05 HISTORY — DX: Chronic obstructive pulmonary disease, unspecified: J44.9

## 2015-01-05 MED ORDER — HYDROCODONE-ACETAMINOPHEN 5-325 MG PO TABS
2.0000 | ORAL_TABLET | Freq: Once | ORAL | Status: AC
  Administered 2015-01-05: 2 via ORAL
  Filled 2015-01-05: qty 2

## 2015-01-05 MED ORDER — DIAZEPAM 5 MG PO TABS
10.0000 mg | ORAL_TABLET | Freq: Once | ORAL | Status: AC
  Administered 2015-01-05: 10 mg via ORAL
  Filled 2015-01-05: qty 2

## 2015-01-05 MED ORDER — BACLOFEN 10 MG PO TABS: 10.0000 mg | ORAL_TABLET | Freq: Three times a day (TID) | ORAL | Status: AC

## 2015-01-05 MED ORDER — DICLOFENAC SODIUM 75 MG PO TBEC: 75.0000 mg | DELAYED_RELEASE_TABLET | Freq: Two times a day (BID) | ORAL | Status: DC

## 2015-01-05 MED ORDER — KETOROLAC TROMETHAMINE 10 MG PO TABS
10.0000 mg | ORAL_TABLET | Freq: Once | ORAL | Status: AC
  Administered 2015-01-05: 10 mg via ORAL
  Filled 2015-01-05: qty 1

## 2015-01-05 MED ORDER — HYDROCODONE-ACETAMINOPHEN 5-325 MG PO TABS: 1.0000 | ORAL_TABLET | ORAL | Status: DC | PRN

## 2015-01-05 NOTE — ED Notes (Signed)
Pain rt lower back/hip and down rt leg.  Has had back surgery in past. No recent injury known.

## 2015-01-05 NOTE — Discharge Instructions (Signed)
Back Pain, Adult °Back pain is very common. The pain often gets better over time. The cause of back pain is usually not dangerous. Most people can learn to manage their back pain on their own.  °HOME CARE  °· Stay active. Start with short walks on flat ground if you can. Try to walk farther each day. °· Do not sit, drive, or stand in one place for more than 30 minutes. Do not stay in bed. °· Do not avoid exercise or work. Activity can help your back heal faster. °· Be careful when you bend or lift an object. Bend at your knees, keep the object close to you, and do not twist. °· Sleep on a firm mattress. Lie on your side, and bend your knees. If you lie on your back, put a pillow under your knees. °· Only take medicines as told by your doctor. °· Put ice on the injured area. °¨ Put ice in a plastic bag. °¨ Place a towel between your skin and the bag. °¨ Leave the ice on for 15-20 minutes, 03-04 times a day for the first 2 to 3 days. After that, you can switch between ice and heat packs. °· Ask your doctor about back exercises or massage. °· Avoid feeling anxious or stressed. Find good ways to deal with stress, such as exercise. °GET HELP RIGHT AWAY IF:  °· Your pain does not go away with rest or medicine. °· Your pain does not go away in 1 week. °· You have new problems. °· You do not feel well. °· The pain spreads into your legs. °· You cannot control when you poop (bowel movement) or pee (urinate). °· Your arms or legs feel weak or lose feeling (numbness). °· You feel sick to your stomach (nauseous) or throw up (vomit). °· You have belly (abdominal) pain. °· You feel like you may pass out (faint). °MAKE SURE YOU:  °· Understand these instructions. °· Will watch your condition. °· Will get help right away if you are not doing well or get worse. °Document Released: 05/12/2008 Document Revised: 02/16/2012 Document Reviewed: 03/28/2014 °[employer]® Patient Information ©2015 [employer]. This information is not intended  to replace advice given to you by your health care provider. Make sure you discuss any questions you have with your health care provider. ° °

## 2015-01-05 NOTE — ED Provider Notes (Signed)
CSN: [id number]     Arrival date & time 01/05/15  1606 History   None    Chief Complaint  Patient presents with  . Back Pain     (Consider location/radiation/quality/duration/timing/severity/associated sxs/prior Treatment) Patient is a 54 y.o. female presenting with back pain. The history is provided by the patient.  Back Pain Location:  Lumbar spine Quality:  Aching and shooting Radiates to:  R knee and R thigh Pain severity:  Severe Pain is:  Same all the time Onset quality:  Gradual Duration:  1 day Timing:  Intermittent Progression:  Worsening Chronicity: acute on chronic pain. Context comment:  Hx of lumbar surgery 1 year ago Relieved by:  Nothing Worsened by:  Movement and standing Ineffective treatments:  None tried Associated symptoms: no abdominal pain, no bladder incontinence, no bowel incontinence, no chest pain, no dysuria, no fever and no perianal numbness     Past Medical History  Diagnosis Date  . Lumbar pain   . COPD (chronic obstructive pulmonary disease)   . Anemia    Past Surgical History  Procedure Laterality Date  . Back surgery     History reviewed. No pertinent family history. History  Substance Use Topics  . Smoking status: Current Every Day Smoker -- 1.00 packs/day    Types: Cigarettes  . Smokeless tobacco: Not on file  . Alcohol Use: No   OB History    No data available     Review of Systems  Constitutional: Negative for fever and activity change.       All ROS Neg except as noted in HPI  HENT: Negative for nosebleeds.   Eyes: Negative for photophobia and discharge.  Respiratory: Positive for shortness of breath. Negative for cough and wheezing.   Cardiovascular: Negative for chest pain and palpitations.  Gastrointestinal: Negative for abdominal pain, blood in stool and bowel incontinence.  Genitourinary: Negative for bladder incontinence, dysuria, frequency and hematuria.  Musculoskeletal: Positive for back pain. Negative for  arthralgias and neck pain.  Skin: Negative.   Neurological: Negative for dizziness, seizures and speech difficulty.  Psychiatric/Behavioral: Negative for hallucinations and confusion.      Allergies  Neurontin  Home Medications   Prior to Admission medications   Not on File   BP 186/93 mmHg  Pulse 98  Temp(Src) 98 F (36.7 C) (Oral)  Resp 18  Ht 5\' 5"  (1.651 m)  Wt 160 lb (72.576 kg)  BMI 26.63 kg/m2  SpO2 100% Physical Exam  Constitutional: She is oriented to person, place, and time. She appears well-developed and well-nourished.  Non-toxic appearance.  HENT:  Head: Normocephalic.  Right Ear: Tympanic membrane and external ear normal.  Left Ear: Tympanic membrane and external ear normal.  Eyes: EOM and lids are normal. Pupils are equal, round, and reactive to light.  Neck: Normal range of motion. Neck supple. Carotid bruit is not present.  Cardiovascular: Normal rate, regular rhythm, normal heart sounds, intact distal pulses and normal pulses.   Pulmonary/Chest: No respiratory distress. She has wheezes. She has rhonchi.  Abdominal: Soft. Bowel sounds are normal. There is no tenderness. There is no guarding.  Musculoskeletal:       Lumbar back: She exhibits decreased range of motion, pain and spasm.  Lymphadenopathy:       Head (right side): No submandibular adenopathy present.       Head (left side): No submandibular adenopathy present.    She has no cervical adenopathy.  Neurological: She is alert and oriented to person, place,  and time. She has normal strength. No cranial nerve deficit or sensory deficit.  Skin: Skin is warm and dry.  Psychiatric: She has a normal mood and affect. Her speech is normal.  Nursing note and vitals reviewed.   ED Course  Procedures (including critical care time) Labs Review Labs Reviewed - No data to display  Imaging Review No results found.   EKG Interpretation None      MDM  Pt states her previous back surgeon  recommended a second surgery on the lower back. She moved to the [facility] area recently. She was seen at the [facility] and referred to an orthopedic MD next to the [facility]. The appointment was canceled due to a death in the doctor's family. She presents to ED for assistance with pain. No PCP yet.  No loss of bowel or bladder function. Pain is similar to previous pain, except more intense. OTC meds are not helping.  Rx for diclofenac, baclofen, and norco given to the patient.   Final diagnoses:  None    **I have reviewed nursing notes, vital signs, and all appropriate lab and imaging results for this patient.Kathie Dike, PA-C 01/05/15 1700  Samuel Jester, DO 01/06/15 [phone]

## 2015-01-05 NOTE — ED Notes (Signed)
Lower R back pain radiating into buttock and to top of thigh and knee.  Pain began this morning. NKI. Hx of lumbar back surger 1 year ago.

## 2016-07-24 DIAGNOSIS — I10 Essential (primary) hypertension: Secondary | ICD-10-CM | POA: Diagnosis not present

## 2016-07-24 DIAGNOSIS — J440 Chronic obstructive pulmonary disease with acute lower respiratory infection: Secondary | ICD-10-CM | POA: Diagnosis not present

## 2016-09-07 DIAGNOSIS — D696 Thrombocytopenia, unspecified: Secondary | ICD-10-CM

## 2016-09-07 HISTORY — DX: Thrombocytopenia, unspecified: D69.6

## 2016-11-14 ENCOUNTER — Encounter: Attending: Nurse Practitioner | Primary: Internal Medicine

## 2016-11-28 ENCOUNTER — Encounter: Attending: Nurse Practitioner | Primary: Internal Medicine

## 2017-02-04 ENCOUNTER — Emergency Department (HOSPITAL_COMMUNITY): Payer: Medicare HMO

## 2017-02-04 ENCOUNTER — Observation Stay (HOSPITAL_COMMUNITY)
Admission: EM | Admit: 2017-02-04 | Discharge: 2017-02-06 | Disposition: A | Discharge status: Home / Self Care | Payer: Medicare HMO | Attending: Internal Medicine | Admitting: Internal Medicine

## 2017-02-04 ENCOUNTER — Encounter (HOSPITAL_COMMUNITY): Payer: Self-pay | Admitting: Emergency Medicine

## 2017-02-04 DIAGNOSIS — B182 Chronic viral hepatitis C: Secondary | ICD-10-CM | POA: Diagnosis not present

## 2017-02-04 DIAGNOSIS — K8050 Calculus of bile duct without cholangitis or cholecystitis without obstruction: Secondary | ICD-10-CM | POA: Diagnosis not present

## 2017-02-04 DIAGNOSIS — Z720 Tobacco use: Secondary | ICD-10-CM | POA: Diagnosis not present

## 2017-02-04 DIAGNOSIS — K8070 Calculus of gallbladder and bile duct without cholecystitis without obstruction: Secondary | ICD-10-CM | POA: Diagnosis not present

## 2017-02-04 DIAGNOSIS — K838 Other specified diseases of biliary tract: Secondary | ICD-10-CM

## 2017-02-04 DIAGNOSIS — I8500 Esophageal varices without bleeding: Secondary | ICD-10-CM

## 2017-02-04 DIAGNOSIS — J449 Chronic obstructive pulmonary disease, unspecified: Secondary | ICD-10-CM | POA: Diagnosis not present

## 2017-02-04 DIAGNOSIS — D696 Thrombocytopenia, unspecified: Secondary | ICD-10-CM | POA: Diagnosis not present

## 2017-02-04 DIAGNOSIS — F17210 Nicotine dependence, cigarettes, uncomplicated: Secondary | ICD-10-CM | POA: Diagnosis not present

## 2017-02-04 DIAGNOSIS — K738 Other chronic hepatitis, not elsewhere classified: Secondary | ICD-10-CM | POA: Diagnosis not present

## 2017-02-04 DIAGNOSIS — R1031 Right lower quadrant pain: Secondary | ICD-10-CM | POA: Diagnosis present

## 2017-02-04 DIAGNOSIS — R161 Splenomegaly, not elsewhere classified: Secondary | ICD-10-CM | POA: Insufficient documentation

## 2017-02-04 DIAGNOSIS — I864 Gastric varices: Secondary | ICD-10-CM

## 2017-02-04 DIAGNOSIS — E876 Hypokalemia: Secondary | ICD-10-CM | POA: Diagnosis not present

## 2017-02-04 DIAGNOSIS — N390 Urinary tract infection, site not specified: Secondary | ICD-10-CM | POA: Diagnosis present

## 2017-02-04 DIAGNOSIS — R109 Unspecified abdominal pain: Secondary | ICD-10-CM

## 2017-02-04 DIAGNOSIS — K7460 Unspecified cirrhosis of liver: Secondary | ICD-10-CM

## 2017-02-04 HISTORY — DX: Sciatica, unspecified side: M54.30

## 2017-02-04 HISTORY — DX: Depression, unspecified: F32.A

## 2017-02-04 HISTORY — DX: Thrombocytopenia, unspecified: D69.6

## 2017-02-04 HISTORY — DX: Dorsalgia, unspecified: M54.9

## 2017-02-04 HISTORY — DX: Other chronic pain: G89.29

## 2017-02-04 HISTORY — DX: Unspecified cirrhosis of liver: K74.60

## 2017-02-04 HISTORY — DX: Major depressive disorder, single episode, unspecified: F32.9

## 2017-02-04 HISTORY — DX: Chronic viral hepatitis C: B18.2

## 2017-02-04 LAB — URINALYSIS, ROUTINE W REFLEX MICROSCOPIC
Bilirubin Urine: NEGATIVE
GLUCOSE, UA: NEGATIVE mg/dL
HGB URINE DIPSTICK: NEGATIVE
Ketones, ur: NEGATIVE mg/dL
NITRITE: NEGATIVE
PH: 7 (ref 5.0–8.0)
Protein, ur: NEGATIVE mg/dL
Specific Gravity, Urine: 1.005 (ref 1.005–1.030)

## 2017-02-04 LAB — COMPREHENSIVE METABOLIC PANEL
ALT: 18 U/L (ref 14–54)
AST: 49 U/L — ABNORMAL HIGH (ref 15–41)
Albumin: 3.3 g/dL — ABNORMAL LOW (ref 3.5–5.0)
Alkaline Phosphatase: 116 U/L (ref 38–126)
Anion gap: 7 (ref 5–15)
BILIRUBIN TOTAL: 0.9 mg/dL (ref 0.3–1.2)
BUN: 8 mg/dL (ref 6–20)
CALCIUM: 8.9 mg/dL (ref 8.9–10.3)
CO2: 27 mmol/L (ref 22–32)
Chloride: 105 mmol/L (ref 101–111)
Creatinine, Ser: 0.63 mg/dL (ref 0.44–1.00)
GFR calc Af Amer: >60 mL/min (ref >60)
GFR calc non Af Amer: >60 mL/min (ref >60)
Glucose, Bld: 98 mg/dL (ref 65–99)
Potassium: 3.2 mmol/L — ABNORMAL LOW (ref 3.5–5.1)
Sodium: 139 mmol/L (ref 135–145)
Total Protein: 7.8 g/dL (ref 6.5–8.1)

## 2017-02-04 LAB — CBC WITH DIFFERENTIAL/PLATELET
Basophils Absolute: 0 10*3/uL (ref 0.0–0.1)
Basophils Relative: 1 %
EOS ABS: 0.1 10*3/uL (ref 0.0–0.7)
EOS PCT: 1 %
HCT: 40.7 % (ref 36.0–46.0)
Hemoglobin: 13.3 g/dL (ref 12.0–15.0)
Lymphocytes Relative: 39 %
Lymphs Abs: 2.5 10*3/uL (ref 0.7–4.0)
MCH: 29.4 pg (ref 26.0–34.0)
MCHC: 32.7 g/dL (ref 30.0–36.0)
MCV: 90 fL (ref 78.0–100.0)
MONOS PCT: 7 %
Monocytes Absolute: 0.5 10*3/uL (ref 0.1–1.0)
Neutro Abs: 3.3 10*3/uL (ref 1.7–7.7)
Neutrophils Relative %: 52 %
PLATELETS: 88 10*3/uL — AB (ref 150–400)
RBC: 4.52 MIL/uL (ref 3.87–5.11)
RDW: 15.2 % (ref 11.5–15.5)
WBC: 6.3 10*3/uL (ref 4.0–10.5)

## 2017-02-04 LAB — LIPASE, BLOOD: Lipase: 28 U/L (ref 11–51)

## 2017-02-04 MED ORDER — ONDANSETRON HCL 4 MG/2ML IJ SOLN
4.0000 mg | INTRAMUSCULAR | Status: AC | PRN
  Administered 2017-02-04 (×2): 4 mg via INTRAVENOUS
  Filled 2017-02-04 (×2): qty 2

## 2017-02-04 MED ORDER — SODIUM CHLORIDE 0.9 % IV SOLN
INTRAVENOUS | Status: DC
  Administered 2017-02-04: 23:00:00 via INTRAVENOUS

## 2017-02-04 MED ORDER — IOPAMIDOL (ISOVUE-300) INJECTION 61%
100.0000 mL | Freq: Once | INTRAVENOUS | Status: AC | PRN
  Administered 2017-02-04: 100 mL via INTRAVENOUS

## 2017-02-04 MED ORDER — SODIUM CHLORIDE 0.9 % IV SOLN
1.5000 g | Freq: Four times a day (QID) | INTRAVENOUS | Status: DC
  Administered 2017-02-05 – 2017-02-06 (×6): 1.5 g via INTRAVENOUS
  Filled 2017-02-04 (×19): qty 1.5

## 2017-02-04 MED ORDER — HYDROMORPHONE HCL 1 MG/ML IJ SOLN
1.0000 mg | INTRAMUSCULAR | Status: DC | PRN
  Administered 2017-02-04 – 2017-02-06 (×9): 1 mg via INTRAVENOUS
  Filled 2017-02-04 (×9): qty 1

## 2017-02-04 MED ORDER — POTASSIUM CHLORIDE 10 MEQ/100ML IV SOLN
10.0000 meq | Freq: Once | INTRAVENOUS | Status: AC
  Administered 2017-02-04: 10 meq via INTRAVENOUS
  Filled 2017-02-04: qty 100

## 2017-02-04 MED ORDER — KCL IN DEXTROSE-NACL 20-5-0.9 MEQ/L-%-% IV SOLN
INTRAVENOUS | Status: DC
  Administered 2017-02-05 (×2): via INTRAVENOUS

## 2017-02-04 MED ORDER — MORPHINE SULFATE (PF) 4 MG/ML IV SOLN
4.0000 mg | INTRAVENOUS | Status: DC | PRN
  Administered 2017-02-04: 4 mg via INTRAVENOUS
  Filled 2017-02-04: qty 1

## 2017-02-04 MED ORDER — ALBUTEROL SULFATE (2.5 MG/3ML) 0.083% IN NEBU
3.0000 mL | INHALATION_SOLUTION | Freq: Four times a day (QID) | RESPIRATORY_TRACT | Status: DC | PRN
  Administered 2017-02-06: 3 mL via RESPIRATORY_TRACT
  Filled 2017-02-04: qty 3

## 2017-02-04 MED ORDER — ONDANSETRON HCL 4 MG/2ML IJ SOLN
4.0000 mg | Freq: Four times a day (QID) | INTRAMUSCULAR | Status: DC | PRN
  Administered 2017-02-05: 4 mg via INTRAVENOUS
  Filled 2017-02-04: qty 2

## 2017-02-04 MED ORDER — SODIUM CHLORIDE 0.9% FLUSH
3.0000 mL | Freq: Two times a day (BID) | INTRAVENOUS | Status: DC
  Administered 2017-02-05 – 2017-02-06 (×3): 3 mL via INTRAVENOUS

## 2017-02-04 MED ORDER — ONDANSETRON HCL 4 MG PO TABS: 4.0000 mg | ORAL_TABLET | Freq: Four times a day (QID) | ORAL | Status: DC | PRN

## 2017-02-04 MED ORDER — SERTRALINE HCL 50 MG PO TABS
50.0000 mg | ORAL_TABLET | Freq: Every day | ORAL | Status: DC
  Administered 2017-02-05 – 2017-02-06 (×2): 50 mg via ORAL
  Filled 2017-02-04 (×2): qty 1

## 2017-02-04 MED ORDER — MOMETASONE FURO-FORMOTEROL FUM 200-5 MCG/ACT IN AERO
2.0000 | INHALATION_SPRAY | Freq: Two times a day (BID) | RESPIRATORY_TRACT | Status: DC
  Administered 2017-02-05 – 2017-02-06 (×3): 2 via RESPIRATORY_TRACT
  Filled 2017-02-04: qty 8.8

## 2017-02-04 MED ORDER — ALPRAZOLAM 0.5 MG PO TABS
0.5000 mg | ORAL_TABLET | Freq: Two times a day (BID) | ORAL | Status: DC
  Administered 2017-02-05 – 2017-02-06 (×4): 0.5 mg via ORAL
  Filled 2017-02-04 (×4): qty 1

## 2017-02-04 MED ORDER — AMLODIPINE BESYLATE 5 MG PO TABS
5.0000 mg | ORAL_TABLET | Freq: Every day | ORAL | Status: DC
Start: 2017-02-05 — End: 2017-02-06
  Administered 2017-02-05 – 2017-02-06 (×2): 5 mg via ORAL
  Filled 2017-02-04 (×2): qty 1

## 2017-02-04 NOTE — ED Triage Notes (Signed)
Having right flank pain for about 5 days.  Foul smelling urine.  Rates pain 8/10.

## 2017-02-04 NOTE — H&P (Signed)
History and Physical    Cassandra Montes:[id number] DOB: 12-Dec-1960 DOA: 02/04/2017  PCP: No PCP Per Patient  Patient coming from: Home.    Chief Complaint:  Suprapubic pain.   HPI: Cassandra Montes is an 56 y.o. female with hx of hep C, not yet Tx, anemia, tobacco abuse, COPD, moved from [facility] to this area, so she hadn't gotten established with any GI doc, presented to the ER actually complaining of suprapubic pain, foul smelling urine, and dyspareunia. She has recurrent UTI's and had been Tx with oral antibiotic without relief.  She has no trouble eating or having abdominal pain.  Evaluation in the ER showed normal LFT and lipase, normal WBC and Hb with platelet of 88K. UA showed evidence of infection.  CT of the abd showed dilated CBD to 1.7cm with pericholecystic fluid, mildly thickened wall, and dilated.  EDP spoke with Dr Darrick Penna of GI, and she recommended an MRCP.  Hospitalist was asked to admit her for further evaluation.   ED Course:  See above.  Rewiew of Systems:  Constitutional: Negative for malaise, fever and chills. No significant weight loss or weight gain Eyes: Negative for eye pain, redness and discharge, diplopia, visual changes, or flashes of light. ENMT: Negative for ear pain, hoarseness, nasal congestion, sinus pressure and sore throat. No headaches; tinnitus, drooling, or problem swallowing. Cardiovascular: Negative for chest pain, palpitations, diaphoresis, dyspnea and peripheral edema. ; No orthopnea, PND Respiratory: Negative for cough, hemoptysis, wheezing and stridor. No pleuritic chestpain. Gastrointestinal: Negative for diarrhea, constipation,  melena, blood in stool, hematemesis, jaundice and rectal bleeding.    Genitourinary: Negative for frequency, dysuria, incontinence,flank pain and hematuria; Musculoskeletal: Negative for back pain and neck pain. Negative for swelling and trauma.;  Skin: . Negative for pruritus, rash, abrasions, bruising and skin  lesion.; ulcerations Neuro: Negative for headache, lightheadedness and neck stiffness. Negative for weakness, altered level of consciousness , altered mental status, extremity weakness, burning feet, involuntary movement, seizure and syncope.  Psych: negative for anxiety, depression, insomnia, tearfulness, panic attacks, hallucinations, paranoia, suicidal or homicidal ideation    Past Medical History:  Diagnosis Date  . Anemia   . Chronic back pain   . COPD (chronic obstructive pulmonary disease) (HCC)   . Depression   . Hepatitis C, chronic (HCC)   . Sciatica   . Thrombocytopenia (HCC) 09/2016   due to chronic hepatitis C and splenomegaly    Past Surgical History:  Procedure Laterality Date  . BACK SURGERY    . TUBAL LIGATION       reports that she has been smoking Cigarettes.  She has been smoking about 1.00 pack per day. She has never used smokeless tobacco. She reports that she does not drink alcohol or use drugs.  Allergies  Allergen Reactions  . Neurontin [Gabapentin] Hives    History reviewed. No pertinent family history.   Prior to Admission medications   Medication Sig Start Date End Date Taking? Authorizing Provider  ADVAIR DISKUS 250-50 MCG/DOSE AEPB Inhale 1 puff into the lungs 2 (two) times daily.  01/14/17  Yes Historical Provider, MD  ALPRAZolam Prudy Feeler) 0.5 MG tablet Take 0.5 mg by mouth 2 (two) times daily.  01/14/17  Yes Historical Provider, MD  amLODipine (NORVASC) 5 MG tablet Take 5 mg by mouth daily.  01/14/17  Yes Historical Provider, MD  Aspirin-Acetaminophen-Caffeine (HEADACHE FORMULA PO) Take 1-2 tablets by mouth once as needed (for headache pain).   Yes Historical Provider, MD  hydrochlorothiazide (MICROZIDE)  12.5 MG capsule Take 12.5 mg by mouth daily.  01/14/17  Yes Historical Provider, MD  sertraline (ZOLOFT) 50 MG tablet Take 50 mg by mouth daily.  01/14/17  Yes Historical Provider, MD  traMADol (ULTRAM) 50 MG tablet Take 50 mg by mouth every 6 (six) hours  as needed for moderate pain or severe pain.  01/14/17  Yes Historical Provider, MD  VENTOLIN HFA 108 (90 Base) MCG/ACT inhaler Inhale 1-2 puffs into the lungs every 6 (six) hours as needed for wheezing or shortness of breath.  01/14/17  Yes Historical Provider, MD    Physical Exam: Vitals:   02/04/17 1712 02/04/17 1930 02/04/17 2030 02/04/17 2200  BP: 139/88 199/91 180/85 175/84  Pulse:  110 85 94  Resp: 16 16 14 18   Temp: 97.6 F (36.4 C)     TempSrc: Oral     SpO2: 100% 100% 100% 100%  Weight: 72.6 kg (160 lb)     Height: 5\' 5"  (1.651 m)         Constitutional: NAD, calm, comfortable Vitals:   02/04/17 1712 02/04/17 1930 02/04/17 2030 02/04/17 2200  BP: 139/88 199/91 180/85 175/84  Pulse:  110 85 94  Resp: 16 16 14 18   Temp: 97.6 F (36.4 C)     TempSrc: Oral     SpO2: 100% 100% 100% 100%  Weight: 72.6 kg (160 lb)     Height: 5\' 5"  (1.651 m)      Eyes: PERRL, lids and conjunctivae normal ENMT: Mucous membranes are moist. Posterior pharynx clear of any exudate or lesions.Normal dentition.  Neck: normal, supple, no masses, no thyromegaly Respiratory: clear to auscultation bilaterally, no wheezing, no crackles. Normal respiratory effort. No accessory muscle use.  Cardiovascular: Regular rate and rhythm, no murmurs / rubs / gallops. No extremity edema. 2+ pedal pulses. No carotid bruits.  Abdomen: no tenderness, no masses palpated. No hepatosplenomegaly. Bowel sounds positive.  Musculoskeletal: no clubbing / cyanosis. No joint deformity upper and lower extremities. Good ROM, no contractures. Normal muscle tone.  Skin: no rashes, lesions, ulcers. No induration Neurologic: CN 2-12 grossly intact. Sensation intact, DTR normal. Strength 5/5 in all 4.  Psychiatric: Normal judgment and insight. Alert and oriented x 3. Normal mood.     Labs on Admission: I have personally reviewed following labs and imaging studies  CBC:  Recent Labs Lab 02/04/17 1940  WBC 6.3  NEUTROABS 3.3    HGB 13.3  HCT 40.7  MCV 90.0  PLT 88*   Basic Metabolic Panel:  Recent Labs Lab 02/04/17 1940  NA 139  K 3.2*  CL 105  CO2 27  GLUCOSE 98  BUN 8  CREATININE 0.63  CALCIUM 8.9   GFR: Estimated Creatinine Clearance: 80.2 mL/min (by C-G formula based on SCr of 0.63 mg/dL). Liver Function Tests:  Recent Labs Lab 02/04/17 1940  AST 49*  ALT 18  ALKPHOS 116  BILITOT 0.9  PROT 7.8  ALBUMIN 3.3*    Recent Labs Lab 02/04/17 1940  LIPASE 28   Urine analysis:    Component Value Date/Time   COLORURINE YELLOW 02/04/2017 1716   APPEARANCEUR CLOUDY (A) 02/04/2017 1716   LABSPEC 1.005 02/04/2017 1716   PHURINE 7.0 02/04/2017 1716   GLUCOSEU NEGATIVE 02/04/2017 1716   HGBUR NEGATIVE 02/04/2017 1716   BILIRUBINUR NEGATIVE 02/04/2017 1716   KETONESUR NEGATIVE 02/04/2017 1716   PROTEINUR NEGATIVE 02/04/2017 1716   NITRITE NEGATIVE 02/04/2017 1716   LEUKOCYTESUR MODERATE (A) 02/04/2017 1716   Radiological Exams on  Admission: Ct Abdomen Pelvis W Contrast  Result Date: 02/04/2017 CLINICAL DATA:  Acute onset of right flank and right lower quadrant bowel pain. Malodorous urine. Initial encounter. EXAM: CT ABDOMEN AND PELVIS WITH CONTRAST TECHNIQUE: Multidetector CT imaging of the abdomen and pelvis was performed using the standard protocol following bolus administration of intravenous contrast. CONTRAST:  100mL ISOVUE-300 IOPAMIDOL (ISOVUE-300) INJECTION 61% COMPARISON:  Lumbar spine radiographs from 03/27/2015 FINDINGS: Lower chest: The visualized lung bases are grossly clear. The visualized portions of the mediastinum are unremarkable. Hepatobiliary: The nodular contour of the liver is compatible with hepatic cirrhosis. There is minimal heterogeneity within the liver, without a definite hepatic mass. 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 Mild splenic varices are noted. The gallbladder is diffusely distended, with trace pericholecystic fluid and mild gallbladder wall  thickening. The common bile duct is dilated, measuring up to 1.7 cm in diameter, raising suspicion for distal obstruction. A stone is noted dependently within the gallbladder. Pancreas: The pancreas is within normal limits. Spleen: The spleen is enlarged, measuring 15.4 cm in length. Adrenals/Urinary Tract: The adrenal glands are unremarkable in appearance. The kidneys are within normal limits. There is no evidence of hydronephrosis. No renal or ureteral stones are identified. No perinephric stranding is seen. Stomach/Bowel: The stomach is unremarkable in appearance. The small bowel is within normal limits. The appendix is normal in caliber, without evidence of appendicitis. The colon is unremarkable in appearance. Vascular/Lymphatic: Scattered calcification is seen along the abdominal aorta and its branches. The abdominal aorta is otherwise grossly unremarkable. The inferior vena cava is grossly unremarkable. No retroperitoneal lymphadenopathy is seen. No pelvic sidewall lymphadenopathy is identified. Reproductive: The bladder is mildly distended and grossly unremarkable. The uterus is unremarkable in appearance. The ovaries are relatively symmetric. No suspicious adnexal masses are seen. Other: No additional soft tissue abnormalities are seen. Musculoskeletal: No acute osseous abnormalities are identified. The patient is status post decompression at the mid lumbar spine. Multilevel vacuum phenomenon and endplate sclerotic change are seen along the lumbar spine. The visualized musculature is unremarkable in appearance. IMPRESSION: 1. Diffuse distention of the gallbladder, with trace pericholecystic fluid and mild gallbladder wall thickening, and dilatation of the common bile duct to 1.7 cm. This is suspicious for distal obstruction. Stone noted dependently within the gallbladder. MRCP or ERCP is recommended for further evaluation. 2. 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 Mild  splenic varices noted. 3. Splenomegaly. 4. Scattered aortic atherosclerosis. 5. Mild degenerative change along the lumbar spine. Electronically Signed   By: Roanna RaiderJeffery  Chang M.D.   On: 02/04/2017 21:19    EKG: Independently reviewed.   Assessment/Plan Principal Problem:   Choledocholithiasis Active Problems:   UTI (urinary tract infection)   Hepatitis C   Tobacco abuse   COPD (chronic obstructive pulmonary disease) (HCC)    PLAN:  1/  UTI:  Her complaint is actually in the suprapubic region, and given hx of prior antibiotics, will Tx with Unasyn to cover enterococcus UTI.    2/  Choledocholithiasis:  She has CT with evidence of dilated CBD, but she really has no symptoms, normal LFTs, and no evidence of acute cholecystitis.  Will get MRCP to better evaluate tomorrow.   3/   COPD:  Stable.  Continue with meds.   Advise tobacco cessation.   4/  Hepatitis C:  Obtain HepB surface antibody.  She will need follow up with GI in consideration for treatment.    DVT prophylaxis: SCD.  Code Status:  FULL CODE.  Family Communication: Fiance at bedside.  Disposition Plan: Home when appropriate.  Consults called: GI:  Dr Darrick Penna.  Admission status: OBS.    Cynthya Yam MD FACP. [facility]  If 7PM-7AM, please contact night-coverage [email or web address] Password [facility]  02/04/2017, 10:32 PM

## 2017-02-04 NOTE — ED Provider Notes (Signed)
[facility]-EMERGENCY DEPT Provider Note   CSN: [id number] Arrival date & time: 02/04/17  1702     History   Chief Complaint Chief Complaint  Patient presents with  . Flank Pain    right  . Abdominal Pain    HPI Abby PotashDonna K Altieri is a 56 y.o. female.   Flank Pain  Associated symptoms include abdominal pain.  Abdominal Pain      Pt was seen at 1930.  Per pt, c/o gradual onset and persistence of constant right flank and RLQ abd "pain" for the past 1 week.  Has been associated with "foul smelling urine."  Describes the abd pain as "sharp."  Denies N/V, no diarrhea, no fevers, no back pain, no rash, no CP/SOB, no black or blood in stools, no dysuria/hematuria.      Past Medical History:  Diagnosis Date  . Anemia   . Chronic back pain   . COPD (chronic obstructive pulmonary disease) (HCC)   . Hepatitis C, chronic (HCC)   . Sciatica   . Thrombocytopenia (HCC) 09/2016   due to chronic hepatitis C and splenomegaly    There are no active problems to display for this patient.   Past Surgical History:  Procedure Laterality Date  . BACK SURGERY    . TUBAL LIGATION      OB History    Gravida Para Term Preterm AB Living   1             SAB TAB Ectopic Multiple Live Births                   Home Medications    Prior to Admission medications   Medication Sig Start Date End Date Taking? Authorizing Provider  diclofenac (VOLTAREN) 75 MG EC tablet Take 1 tablet (75 mg total) by mouth 2 (two) times daily. 01/05/15   Ivery QualeHobson Bryant, PA-C  HYDROcodone-acetaminophen (NORCO/VICODIN) 5-325 MG per tablet Take 1 tablet by mouth every 4 (four) hours as needed. 01/05/15   Ivery QualeHobson Bryant, PA-C    Family History History reviewed. No pertinent family history.  Social History Social History  Substance Use Topics  . Smoking status: Current Every Day Smoker    Packs/day: 1.00    Types: Cigarettes  . Smokeless tobacco: Never Used  . Alcohol use No     Allergies   Neurontin  [gabapentin]   Review of Systems Review of Systems  Gastrointestinal: Positive for abdominal pain.  Genitourinary: Positive for flank pain.  ROS: Statement: All systems negative except as marked or noted in the HPI; Constitutional: Negative for fever and chills. ; ; Eyes: Negative for eye pain, redness and discharge. ; ; ENMT: Negative for ear pain, hoarseness, nasal congestion, sinus pressure and sore throat. ; ; Cardiovascular: Negative for chest pain, palpitations, diaphoresis, dyspnea and peripheral edema. ; ; Respiratory: Negative for cough, wheezing and stridor. ; ; Gastrointestinal: +abd pain. Negative for nausea, vomiting, diarrhea, blood in stool, hematemesis, jaundice and rectal bleeding. . ; ; Genitourinary: +flank pain. Negative for dysuria and hematuria. ; ; Musculoskeletal: Negative for back pain and neck pain. Negative for swelling and trauma.; ; Skin: Negative for pruritus, rash, abrasions, blisters, bruising and skin lesion.; ; Neuro: Negative for headache, lightheadedness and neck stiffness. Negative for weakness, altered level of consciousness, altered mental status, extremity weakness, paresthesias, involuntary movement, seizure and syncope.        Physical Exam Updated Vital Signs BP 199/91   Pulse 110   Temp 97.6 F (36.4 C) (  Oral)   Resp 16   Ht 5\' 5"  (1.651 m)   Wt 160 lb (72.6 kg)   SpO2 100%   BMI 26.63 kg/m   Physical Exam 1935: Physical examination:  Nursing notes reviewed; Vital signs and O2 SAT reviewed;  Constitutional: Well developed, Well nourished, Well hydrated, Uncomfortable appearing; Head:  Normocephalic, atraumatic; Eyes: EOMI, PERRL, No scleral icterus; ENMT: Mouth and pharynx normal, Mucous membranes moist; Neck: Supple, Full range of motion, No lymphadenopathy; Cardiovascular: Tachycardic rate and rhythm, No gallop; Respiratory: Breath sounds clear & equal bilaterally, No wheezes.  Speaking full sentences with ease, Normal respiratory  effort/excursion; Chest: Nontender, Movement normal; Abdomen: Soft, +RLQ tenderness to palp. No rebound or guarding. Nondistended, Normal bowel sounds; Genitourinary: No CVA tenderness; Spine:  No midline CS, TS, LS tenderness. +TTP right lumbar paraspinal muscles.;;  Extremities: Pulses normal, No tenderness, No edema, No calf edema or asymmetry.; Neuro: AA&Ox3, Major CN grossly intact.  Speech clear. No gross focal motor or sensory deficits in extremities.; Skin: Color normal, Warm, Dry.   ED Treatments / Results  Labs (all labs ordered are listed, but only abnormal results are displayed)   EKG  EKG Interpretation None       Radiology No results found.  Procedures Procedures (including critical care time)  Medications Ordered in ED Medications  morphine 4 MG/ML injection 4 mg (4 mg Intravenous Given 02/04/17 1952)  ondansetron (ZOFRAN) injection 4 mg (4 mg Intravenous Given 02/04/17 1952)     Initial Impression / Assessment and Plan / ED Course  I have reviewed the triage vital signs and the nursing notes.  Pertinent labs & imaging results that were available during my care of the patient were reviewed by me and considered in my medical decision making (see chart for details).  MDM Reviewed: previous chart, nursing note and vitals Reviewed previous: labs Interpretation: labs and CT scan   Results for orders placed or performed during the hospital encounter of 02/04/17  Urinalysis, Routine w reflex microscopic  Result Value Ref Range   Color, Urine YELLOW YELLOW   APPearance CLOUDY (A) CLEAR   Specific Gravity, Urine 1.005 1.005 - 1.030   pH 7.0 5.0 - 8.0   Glucose, UA NEGATIVE NEGATIVE mg/dL   Hgb urine dipstick NEGATIVE NEGATIVE   Bilirubin Urine NEGATIVE NEGATIVE   Ketones, ur NEGATIVE NEGATIVE mg/dL   Protein, ur NEGATIVE NEGATIVE mg/dL   Nitrite NEGATIVE NEGATIVE   Leukocytes, UA MODERATE (A) NEGATIVE   RBC / HPF 0-5 0 - 5 RBC/hpf   WBC, UA 6-30 0 - 5 WBC/hpf    Bacteria, UA RARE (A) NONE SEEN   Mucous PRESENT    Budding Yeast PRESENT   Comprehensive metabolic panel  Result Value Ref Range   Sodium 139 135 - 145 mmol/L   Potassium 3.2 (L) 3.5 - 5.1 mmol/L   Chloride 105 101 - 111 mmol/L   CO2 27 22 - 32 mmol/L   Glucose, Bld 98 65 - 99 mg/dL   BUN 8 6 - 20 mg/dL   Creatinine, Ser 1.61 0.44 - 1.00 mg/dL   Calcium 8.9 8.9 - 09.6 mg/dL   Total Protein 7.8 6.5 - 8.1 g/dL   Albumin 3.3 (L) 3.5 - 5.0 g/dL   AST 49 (H) 15 - 41 U/L   ALT 18 14 - 54 U/L   Alkaline Phosphatase 116 38 - 126 U/L   Total Bilirubin 0.9 0.3 - 1.2 mg/dL   GFR calc non Af Amer >60 >60  mL/min   GFR calc Af Amer >60 >60 mL/min   Anion gap 7 5 - 15  Lipase, blood  Result Value Ref Range   Lipase 28 11 - 51 U/L  CBC with Differential  Result Value Ref Range   WBC 6.3 4.0 - 10.5 K/uL   RBC 4.52 3.87 - 5.11 MIL/uL   Hemoglobin 13.3 12.0 - 15.0 g/dL   HCT 91.4 78.2 - 95.6 %   MCV 90.0 78.0 - 100.0 fL   MCH 29.4 26.0 - 34.0 pg   MCHC 32.7 30.0 - 36.0 g/dL   RDW 21.3 08.6 - 57.8 %   Platelets 88 (L) 150 - 400 K/uL   Neutrophils Relative % 52 %   Neutro Abs 3.3 1.7 - 7.7 K/uL   Lymphocytes Relative 39 %   Lymphs Abs 2.5 0.7 - 4.0 K/uL   Monocytes Relative 7 %   Monocytes Absolute 0.5 0.1 - 1.0 K/uL   Eosinophils Relative 1 %   Eosinophils Absolute 0.1 0.0 - 0.7 K/uL   Basophils Relative 1 %   Basophils Absolute 0.0 0.0 - 0.1 K/uL   Ct Abdomen Pelvis W Contrast Result Date: 02/04/2017 CLINICAL DATA:  Acute onset of right flank and right lower quadrant bowel pain. Malodorous urine. Initial encounter. EXAM: CT ABDOMEN AND PELVIS WITH CONTRAST TECHNIQUE: Multidetector CT imaging of the abdomen and pelvis was performed using the standard protocol following bolus administration of intravenous contrast. CONTRAST:  ISOVUE-300 IOPAMIDOL (ISOVUE-300) INJECTION 61% COMPARISON:  Lumbar spine radiographs from 03/27/2015 FINDINGS: Lower chest: The visualized lung bases are  grossly clear. The visualized portions of the mediastinum are unremarkable. Hepatobiliary: The nodular contour of the liver is compatible with hepatic cirrhosis. There is minimal heterogeneity within the liver, without a definite hepatic mass. 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 Mild splenic varices are noted. The gallbladder is diffusely distended, with trace pericholecystic fluid and mild gallbladder wall thickening. The common bile duct is dilated, measuring up to 1.7 cm in diameter, raising suspicion for distal obstruction. A stone is noted dependently within the gallbladder. Pancreas: The pancreas is within normal limits. Spleen: The spleen is enlarged, measuring 15.4 cm in length. Adrenals/Urinary Tract: The adrenal glands are unremarkable in appearance. The kidneys are within normal limits. There is no evidence of hydronephrosis. No renal or ureteral stones are identified. No perinephric stranding is seen. Stomach/Bowel: The stomach is unremarkable in appearance. The small bowel is within normal limits. The appendix is normal in caliber, without evidence of appendicitis. The colon is unremarkable in appearance. Vascular/Lymphatic: Scattered calcification is seen along the abdominal aorta and its branches. The abdominal aorta is otherwise grossly unremarkable. The inferior vena cava is grossly unremarkable. No retroperitoneal lymphadenopathy is seen. No pelvic sidewall lymphadenopathy is identified. Reproductive: The bladder is mildly distended and grossly unremarkable. The uterus is unremarkable in appearance. The ovaries are relatively symmetric. No suspicious adnexal masses are seen. Other: No additional soft tissue abnormalities are seen. Musculoskeletal: No acute osseous abnormalities are identified. The patient is status post decompression at the mid lumbar spine. Multilevel vacuum phenomenon and endplate sclerotic change are seen along the lumbar spine. The visualized musculature is  unremarkable in appearance. IMPRESSION: 1. Diffuse distention of the gallbladder, with trace pericholecystic fluid and mild gallbladder wall thickening, and dilatation of the common bile duct to 1.7 cm. This is suspicious for distal obstruction. Stone noted dependently within the gallbladder. MRCP or ERCP is recommended for further evaluation. 2. Findings of hepatic cirrhosis. Prominent  esophageal and gastric varices seen. Mild splenic varices noted. 3. Splenomegaly. 4. Scattered aortic atherosclerosis. 5. Mild degenerative change along the lumbar spine. Electronically Signed   By: Roanna Raider M.D.   On: 02/04/2017 21:19    2140:  Platelets per baseline. CT as above; normal WBC count and pt remains afebrile. Doubt acute cholecystitis. Dx and testing d/w pt and family.  Questions answered.  Verb understanding, agreeable to admit.  T/C to GI Dr. Darrick Penna, case discussed, including:  HPI, pertinent PM/SHx, VS/PE, dx testing, ED course and treatment:  Agreeable to consult, doubts acute cholecystitis at this time, pt will need MRCP tomorrow and general surgical consult.   T/C to [facility] Dr Conley Rolls, case discussed, including:  HPI, pertinent PM/SHx, VS/PE, dx testing, ED course and treatment:  Agreeable to admit.    Final Clinical Impressions(s) / ED Diagnoses   Final diagnoses:  None    New Prescriptions New Prescriptions   No medications on file      Samuel Jester, DO 02/08/17 1747

## 2017-02-05 ENCOUNTER — Observation Stay (HOSPITAL_COMMUNITY): Payer: Medicare HMO

## 2017-02-05 ENCOUNTER — Encounter (HOSPITAL_COMMUNITY): Payer: Self-pay | Admitting: Gastroenterology

## 2017-02-05 DIAGNOSIS — R109 Unspecified abdominal pain: Secondary | ICD-10-CM

## 2017-02-05 DIAGNOSIS — K7460 Unspecified cirrhosis of liver: Secondary | ICD-10-CM | POA: Diagnosis not present

## 2017-02-05 DIAGNOSIS — I8500 Esophageal varices without bleeding: Secondary | ICD-10-CM | POA: Diagnosis not present

## 2017-02-05 DIAGNOSIS — K8050 Calculus of bile duct without cholangitis or cholecystitis without obstruction: Secondary | ICD-10-CM | POA: Diagnosis not present

## 2017-02-05 DIAGNOSIS — R161 Splenomegaly, not elsewhere classified: Secondary | ICD-10-CM | POA: Diagnosis not present

## 2017-02-05 DIAGNOSIS — Z720 Tobacco use: Secondary | ICD-10-CM | POA: Diagnosis not present

## 2017-02-05 DIAGNOSIS — D696 Thrombocytopenia, unspecified: Secondary | ICD-10-CM | POA: Diagnosis not present

## 2017-02-05 DIAGNOSIS — I864 Gastric varices: Secondary | ICD-10-CM | POA: Diagnosis not present

## 2017-02-05 DIAGNOSIS — B182 Chronic viral hepatitis C: Secondary | ICD-10-CM

## 2017-02-05 DIAGNOSIS — R1030 Lower abdominal pain, unspecified: Secondary | ICD-10-CM | POA: Diagnosis not present

## 2017-02-05 DIAGNOSIS — N390 Urinary tract infection, site not specified: Secondary | ICD-10-CM | POA: Diagnosis not present

## 2017-02-05 DIAGNOSIS — J449 Chronic obstructive pulmonary disease, unspecified: Secondary | ICD-10-CM | POA: Diagnosis not present

## 2017-02-05 DIAGNOSIS — K838 Other specified diseases of biliary tract: Secondary | ICD-10-CM | POA: Diagnosis not present

## 2017-02-05 LAB — CBC
HEMATOCRIT: 37.5 % (ref 36.0–46.0)
Hemoglobin: 11.9 g/dL — ABNORMAL LOW (ref 12.0–15.0)
MCH: 29 pg (ref 26.0–34.0)
MCHC: 31.7 g/dL (ref 30.0–36.0)
MCV: 91.2 fL (ref 78.0–100.0)
Platelets: 79 10*3/uL — ABNORMAL LOW (ref 150–400)
RBC: 4.11 MIL/uL (ref 3.87–5.11)
RDW: 15.5 % (ref 11.5–15.5)
WBC: 4.8 10*3/uL (ref 4.0–10.5)

## 2017-02-05 LAB — COMPREHENSIVE METABOLIC PANEL
ALT: 14 U/L (ref 14–54)
AST: 42 U/L — ABNORMAL HIGH (ref 15–41)
Albumin: 2.7 g/dL — ABNORMAL LOW (ref 3.5–5.0)
Alkaline Phosphatase: 92 U/L (ref 38–126)
Anion gap: 6 (ref 5–15)
BUN: 7 mg/dL (ref 6–20)
CO2: 27 mmol/L (ref 22–32)
Calcium: 8.3 mg/dL — ABNORMAL LOW (ref 8.9–10.3)
Chloride: 107 mmol/L (ref 101–111)
Creatinine, Ser: 0.64 mg/dL (ref 0.44–1.00)
Glucose, Bld: 91 mg/dL (ref 65–99)
POTASSIUM: 3.7 mmol/L (ref 3.5–5.1)
Sodium: 140 mmol/L (ref 135–145)
Total Bilirubin: 1 mg/dL (ref 0.3–1.2)
Total Protein: 6.5 g/dL (ref 6.5–8.1)

## 2017-02-05 LAB — PROTIME-INR
INR: 1.28
Prothrombin Time: 16 seconds — ABNORMAL HIGH (ref 11.4–15.2)

## 2017-02-05 LAB — GLUCOSE, CAPILLARY: GLUCOSE-CAPILLARY: 117 mg/dL — AB (ref 65–99)

## 2017-02-05 LAB — AMMONIA: Ammonia: 55 umol/L — ABNORMAL HIGH (ref 9–35)

## 2017-02-05 LAB — TSH: TSH: 2.066 u[IU]/mL (ref 0.350–4.500)

## 2017-02-05 MED ORDER — SODIUM CHLORIDE 0.9 % IV SOLN
INTRAVENOUS | Status: AC
  Filled 2017-02-05 (×2): qty 1.5

## 2017-02-05 MED ORDER — LACTULOSE 10 GM/15ML PO SOLN
10.0000 g | Freq: Two times a day (BID) | ORAL | Status: DC
  Administered 2017-02-05 – 2017-02-06 (×3): 10 g via ORAL
  Filled 2017-02-05 (×3): qty 30

## 2017-02-05 MED ORDER — GADOBENATE DIMEGLUMINE 529 MG/ML IV SOLN
15.0000 mL | Freq: Once | INTRAVENOUS | Status: AC | PRN
  Administered 2017-02-05: 15 mL via INTRAVENOUS

## 2017-02-05 NOTE — Care Management Note (Addendum)
Case Management Note  Patient Details  Name: Cassandra Montes MRN: [id number] Date of Birth: 12/03/1961  Subjective/Objective:                  Pt admitted with choledocholithiasis and new cirrhosis. She is from home, lives with her SO and is ind with ADL's. No PCP listed. She will f/u closely with GI and will need surgery at home point. She has insurance with drug coverage and transportation to appointments. She has no HH or DME needs PTA. Pt plans to return home with self care at DC.   Action/Plan: No CM needs anticipated. List of PCP's accepting now pt's provided.   Expected Discharge Date:    02/06/2017              Expected Discharge Plan:  Home/Self Care  In-House Referral:  NA  Discharge planning Services  CM Consult  Post Acute Care Choice:  NA Choice offered to:  NA  Status of Service:  Completed, signed off  Malcolm MetroChildress, Kaziah Krizek Demske, RN 02/05/2017, 1:44 PM

## 2017-02-05 NOTE — Care Management Obs Status (Signed)
MEDICARE OBSERVATION STATUS NOTIFICATION   Patient Details  Name: Cassandra Montes MRN: [id number] Date of Birth: 10/11/1961   Medicare Observation Status Notification Given:  Yes    Malcolm MetroChildress, Allanah Mcfarland Demske, RN 02/05/2017, 1:44 PM

## 2017-02-05 NOTE — Consult Note (Signed)
Referring Provider: Merlene Laughtermair Latif Sheikh, DO Primary Care Physician:  No PCP Per Patient Primary Gastroenterologist:  Roetta SessionsMichael Rourk, MD  Reason for Consultation:  Dilated CBD on CT  HPI: Cassandra PotashDonna K Buchler is a 56 y.o. female with history of hepatitis C, newly diagnosed cirrhosis based on this admission's CT, COPD who presented for lower abdominal pain, malodorous urine. [place]. She reports history of recurrent urinary tract infections in the past. Over the past 4 months she's had a malodorous urine, lower abdominal pain more so on the right side, dyspareunia. She has been on a couple rounds of antibiotics more recently with resolution of the symptoms. Over the course of last several days she's had recurrent symptoms but states worse than before. She states she has a little dysuria but mostly complains of malodorous urine, pelvic pain. Denies any problems eating. No nausea or vomiting. Generally bowel function is regular. Denies melena rectal bleeding. No heartburn or dysphagia.   ER evaluation revealed essentially normal LFTs with the exception of mildly elevated AST of 49. Urinalysis with moderate leukocyte esterase, rare bacteria, cloudy in appearance. No bilirubin. Lipase normal. Platelet count 88,000, normal white blood cell count and hemoglobin. Ammonia level 55. Of note, over the past 4 months significant other has been concerned that she's been more confused and thought processes not quite clear.  CT abdomen/pelvis with contrast yesterday showed nodular contour to the liver compatible with cirrhosis. Prominent esophageal and gastric varices, mild splenic varices. Splenomegaly. Gallbladder diffusely distended with trace pericholecystic fluid, mild gallbladder wall thickening, CBD measured 1.7 cm, stone noted in the gallbladder. Appendix normal. Colon unremarkable.  She reports a history of chronic hepatitis C, diagnosed in 2001. She  states she's never seen a gastroenterologist. Her significant other of 25 years also has hepatitis C. 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 One went on to liver transplant. Both has subsequently passed. Recently she was referred to oncology for easy bruising and thrombocytopenia. [place]. Records outlined below which are available from care everywhere.Patient denies significant history of alcohol use. She denies illicit drug use previously or currently. She is never received a blood transfusion or had tattoos. She always assumes she had contracted hepatitis C from her brothers somehow. Significant other of 25 years has hepatitis C as well.   [facility] Labs from December 2017: Positive hepatitis C antibody, genotype 1A, HCV RNA 1,610,9602,222,394. Platelet count 123,000. AST 44, ALT 17, albumin 3.5, alkaline phosphatase 118, total bilirubin 1  Abdominal ultrasound dated November 2010, CBD diameter 7.2 mm. No gallstones. Unremarkable appearing liver.  Patient reports mammogram in January showed a spot on her left breast. I do not have access to those records.   Prior to Admission medications   Medication Sig Start Date End Date Taking? Authorizing Provider  ADVAIR DISKUS 250-50 MCG/DOSE AEPB Inhale 1 puff into the lungs 2 (two) times daily.  01/14/17  Yes Historical Provider, MD  ALPRAZolam Prudy Feeler(XANAX) 0.5 MG tablet Take 0.5 mg by mouth 2 (two) times daily.  01/14/17  Yes Historical Provider, MD  amLODipine (NORVASC) 5 MG tablet Take 5 mg by mouth daily.  01/14/17  Yes Historical Provider, MD          hydrochlorothiazide (MICROZIDE) 12.5 MG capsule Take 12.5 mg by mouth daily.  01/14/17  Yes Historical Provider, MD  sertraline (ZOLOFT) 50 MG tablet Take 50  mg by mouth daily.  01/14/17  Yes Historical Provider, MD  traMADol (ULTRAM) 50 MG tablet Take 50 mg by mouth every 6 (six) hours as needed for moderate pain or  severe pain.  01/14/17  Yes Historical Provider, MD  VENTOLIN HFA 108 (90 Base) MCG/ACT inhaler Inhale 1-2 puffs into the lungs every 6 (six) hours as needed for wheezing or shortness of breath.  01/14/17  Yes Historical Provider, MD    Current Facility-Administered Medications  Medication Dose Route Frequency Provider Last Rate Last Dose  . albuterol (PROVENTIL) (2.5 MG/3ML) 0.083% nebulizer solution 3 mL  3 mL Inhalation Q6H PRN Houston Siren, MD      . ALPRAZolam Prudy Feeler) tablet 0.5 mg  0.5 mg Oral BID Houston Siren, MD   0.5 mg at 02/05/17 0023  . amLODipine (NORVASC) tablet 5 mg  5 mg Oral Daily Houston Siren, MD      . ampicillin-sulbactam (UNASYN) 1.5 g in sodium chloride 0.9 % 50 mL IVPB  1.5 g Intravenous Q6H Houston Siren, MD   1.5 g at 02/05/17 0545  . dextrose 5 % and 0.9 % NaCl with KCl 20 mEq/L infusion   Intravenous Continuous Houston Siren, MD 75 mL/hr at 02/05/17 0024    . HYDROmorphone (DILAUDID) injection 1 mg  1 mg Intravenous Q4H PRN Houston Siren, MD   1 mg at 02/05/17 0544  . mometasone-formoterol (DULERA) 200-5 MCG/ACT inhaler 2 puff  2 puff Inhalation BID Houston Siren, MD      . ondansetron [facility]) tablet 4 mg  4 mg Oral Q6H PRN Houston Siren, MD       Or  . ondansetron [facility]) injection 4 mg  4 mg Intravenous Q6H PRN Houston Siren, MD      . sertraline (ZOLOFT) tablet 50 mg  50 mg Oral Daily Houston Siren, MD      . sodium chloride flush (NS) 0.9 % injection 3 mL  3 mL Intravenous Q12H Houston Siren, MD   3 mL at 02/05/17 0000    Allergies as of 02/04/2017 - Review Complete 02/04/2017  Allergen Reaction Noted  . Neurontin [gabapentin] Hives 01/05/2015    Past Medical History:  Diagnosis Date  . Anemia   . Chronic back pain   . Cirrhosis (HCC)   . COPD (chronic obstructive pulmonary disease) (HCC)   . Depression   . Hepatitis C, chronic (HCC)   . Sciatica   . Thrombocytopenia (HCC) 09/2016   due to chronic hepatitis C and splenomegaly    Past Surgical History:  Procedure Laterality Date  . BACK SURGERY    .  TUBAL LIGATION      Family History  Problem Relation Age of Onset  . Liver disease Mother     Enlarged  . Cirrhosis Brother     Alcohol and hepatitis C  . Cirrhosis Brother     Alcohol and hepatitis C, received liver transplant    Social History   Social History  . Marital status: Widowed    Spouse name: N/A  . Number of children: N/A  . Years of education: N/A   Occupational History  . Not on file.   Social History Main Topics  . Smoking status: Current Every Day Smoker    Packs/day: 1.00    Types: Cigarettes  . Smokeless tobacco: Never Used  . Alcohol use No  . Drug use: No  . Sexual activity: Yes    Birth control/ protection: Post-menopausal   Other Topics Concern  . Not  on file   Social History Narrative  . No narrative on file     ROS:  General: Negative for anorexia, weight loss, fever, chills, fatigue, weakness. Eyes: Negative for vision changes.  ENT: Negative for hoarseness, difficulty swallowing , nasal congestion. CV: Negative for chest pain, angina, palpitations, dyspnea on exertion, peripheral edema.  Respiratory: Negative for dyspnea at rest, dyspnea on exertion, cough, sputum, wheezing.  GI: See history of present illness. GU:  Negative for   hematuria, urinary incontinence,  nocturnal urination. See hpi MS: Negative for joint pain, low back pain.  Derm: Negative for rash or itching.  Neuro: Negative for weakness, abnormal sensation, seizure, frequent headaches, memory loss. ++confusion.  Psych: Negative for anxiety, depression, suicidal ideation, hallucinations.  Endo: Negative for unusual weight change.  Heme: Negative for bruising or bleeding. Allergy: Negative for rash or hives.       Physical Examination: Vital signs in last 24 hours: Temp:  [97.6 F (36.4 C)-98.2 F (36.8 C)] 98.2 F (36.8 C) (02/28 2350) Pulse Rate:  [80-110] 80 (02/28 2350) Resp:  [14-18] 18 (02/28 2350) BP: (139-199)/(84-92) 169/92 (02/28 2350) SpO2:  [100 %]  100 % (02/28 2350) Weight:  [160 lb (72.6 kg)-166 lb 1.6 oz (75.3 kg)] 166 lb 1.6 oz (75.3 kg) (02/28 2350) Last BM Date: 02/04/17  General: Well-nourished, well-developed in no acute distress. Accompanied by significant other. Head: Normocephalic, atraumatic.   Eyes: Conjunctiva pink, no icterus. Mouth: Oropharyngeal mucosa moist and pink , no lesions erythema or exudate. Neck: Supple without thyromegaly, masses, or lymphadenopathy.  Lungs: Clear to auscultation bilaterally.  Heart: Regular rate and rhythm, no murmurs rubs or gallops.  Abdomen: Bowel sounds are normal, minimal tenderness in the right upper quadrant region, moderate tenderness in the right lower quadrant suprapubic region, no masses, no abdominal bruits or    hernia , no rebound or guarding.  Did not appreciate hepatosplenomegaly Rectal: Not performed Extremities: No lower extremity edema, clubbing, deformity.  Neuro: Alert and oriented x 4 , grossly normal neurologically.  Skin: Warm and dry, no rash or jaundice.   Psych: Alert and cooperative, normal mood and affect.        Intake/Output from previous day: 02/28 0701 - 03/01 0700 In: 248 [I.V.:198; IV Piggyback:50] Out: -  Intake/Output this shift: No intake/output data recorded.  Lab Results: CBC  Recent Labs  02/04/17 1940 02/05/17 0529  WBC 6.3 4.8  HGB 13.3 11.9*  HCT 40.7 37.5  MCV 90.0 91.2  PLT 88* 79*   BMET  Recent Labs  02/04/17 1940 02/05/17 0529  NA 139 140  K 3.2* 3.7  CL 105 107  CO2 27 27  GLUCOSE 98 91  BUN 8 7  CREATININE 0.63 0.64  CALCIUM 8.9 8.3*   LFT  Recent Labs  02/04/17 1940 02/05/17 0529  BILITOT 0.9 1.0  ALKPHOS 116 92  AST 49* 42*  ALT 18 14  PROT 7.8 6.5  ALBUMIN 3.3* 2.7*    Lipase  Recent Labs  02/04/17 1940  LIPASE 28    PT/INR No results for input(s): LABPROT, INR in the last 72 hours.    Imaging Studies: Ct Abdomen Pelvis W Contrast  Result Date: 02/04/2017 CLINICAL DATA:  Acute  onset of right flank and right lower quadrant bowel pain. Malodorous urine. Initial encounter. EXAM: CT ABDOMEN AND PELVIS WITH CONTRAST TECHNIQUE: Multidetector CT imaging of the abdomen and pelvis was performed using the standard protocol following bolus administration of intravenous contrast. CONTRAST:   ISOVUE-300 IOPAMIDOL (ISOVUE-300) INJECTION 61% COMPARISON:  Lumbar spine radiographs from 03/27/2015 FINDINGS: Lower chest: The visualized lung bases are grossly clear. The visualized portions of the mediastinum are unremarkable. Hepatobiliary: The nodular contour of the liver is compatible with hepatic cirrhosis. There is minimal heterogeneity within the liver, without a definite hepatic mass. 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 Mild splenic varices are noted. The gallbladder is diffusely distended, with trace pericholecystic fluid and mild gallbladder wall thickening. The common bile duct is dilated, measuring up to 1.7 cm in diameter, raising suspicion for distal obstruction. A stone is noted dependently within the gallbladder. Pancreas: The pancreas is within normal limits. Spleen: The spleen is enlarged, measuring 15.4 cm in length. Adrenals/Urinary Tract: The adrenal glands are unremarkable in appearance. The kidneys are within normal limits. There is no evidence of hydronephrosis. No renal or ureteral stones are identified. No perinephric stranding is seen. Stomach/Bowel: The stomach is unremarkable in appearance. The small bowel is within normal limits. The appendix is normal in caliber, without evidence of appendicitis. The colon is unremarkable in appearance. Vascular/Lymphatic: Scattered calcification is seen along the abdominal aorta and its branches. The abdominal aorta is otherwise grossly unremarkable. The inferior vena cava is grossly unremarkable. No retroperitoneal lymphadenopathy is seen. No pelvic sidewall lymphadenopathy is identified. Reproductive: The bladder is mildly  distended and grossly unremarkable. The uterus is unremarkable in appearance. The ovaries are relatively symmetric. No suspicious adnexal masses are seen. Other: No additional soft tissue abnormalities are seen. Musculoskeletal: No acute osseous abnormalities are identified. The patient is status post decompression at the mid lumbar spine. Multilevel vacuum phenomenon and endplate sclerotic change are seen along the lumbar spine. The visualized musculature is unremarkable in appearance. IMPRESSION: 1. Diffuse distention of the gallbladder, with trace pericholecystic fluid and mild gallbladder wall thickening, and dilatation of the common bile duct to 1.7 cm. This is suspicious for distal obstruction. Stone noted dependently within the gallbladder. MRCP or ERCP is recommended for further evaluation. 2. 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 Mild splenic varices noted. 3. Splenomegaly. 4. Scattered aortic atherosclerosis. 5. Mild degenerative change along the lumbar spine. Electronically Signed   By: Roanna Raider M.D.   On: 02/04/2017 21:19   Mr 3d Recon At Scanner  Result Date: 02/05/2017 CLINICAL DATA:  Right abdominal pain. Cholelithiasis. Biliary ductal dilatation seen on recent CT. EXAM: MRI ABDOMEN WITHOUT AND WITH CONTRAST (INCLUDING MRCP) TECHNIQUE: Multiplanar multisequence MR imaging of the abdomen was performed both before and after the administration of intravenous contrast. Heavily T2-weighted images of the biliary and pancreatic ducts were obtained, and three-dimensional MRCP images were rendered by post processing. CONTRAST:  15mL MULTIHANCE GADOBENATE DIMEGLUMINE 529 MG/ML IV SOLN COMPARISON:  CT 02/04/2017 FINDINGS: Lower chest: No acute findings. Hepatobiliary: Changes of hepatic cirrhosis are demonstrated. No liver mass identified. Minimal abdominal ascites noted. Gallbladder is distended and contains small gallstones. There is mild pericholecystic fluid,  without significant gallbladder wall thickening. Although this could be secondary to hepatic cirrhosis, acute cholecystitis cannot be excluded. Diffuse biliary ductal dilatation is seen, with common bile duct measuring 16 mm. Abrupt stricture of the distal common bile duct is seen. No definite choledocholithiasis seen. Pancreas: No mass or inflammatory changes. No evidence of pancreatic ductal dilatation or pancreas divisum. Spleen: Mild splenomegaly, measuring 15 cm in length. No splenic masses identified. This is consistent portal venous hypertension. Adrenals/Urinary Tract: No masses identified. No evidence of hydronephrosis. Stomach/Bowel: Visualized portions within the abdomen are  unremarkable. Vascular/Lymphatic: No pathologically enlarged lymph nodes identified. No abdominal aortic aneurysm. Other:  None. Musculoskeletal:  No suspicious bone lesions identified. IMPRESSION: Cholelithiasis. Gallbladder is distended with mild pericholecystic, but no significant gallbladder wall thickening. Although this could be secondary to hepatic cirrhosis, acute cholecystitis cannot definitely be excluded. Recommend clinical correlation, and consider nuclear medicine hepatobiliary scan if warranted clinically. Diffuse biliary ductal dilatation, with abrupt stricture of the distal common bile duct. No definite mass or choledocholithiasis identified. Consider ERCP for further evaluation. Hepatic cirrhosis.  No evidence of hepatic neoplasm. Mild splenomegaly and minimal ascites, consistent with portal venous hypertension. Electronically Signed   By: Myles Rosenthal M.D.   On: 02/05/2017 08:43   Mr Abdomen Mrcp Vivien Rossetti Contast  Result Date: 02/05/2017 CLINICAL DATA:  Right abdominal pain. Cholelithiasis. Biliary ductal dilatation seen on recent CT. EXAM: MRI ABDOMEN WITHOUT AND WITH CONTRAST (INCLUDING MRCP) TECHNIQUE: Multiplanar multisequence MR imaging of the abdomen was performed both before and after the administration of  intravenous contrast. Heavily T2-weighted images of the biliary and pancreatic ducts were obtained, and three-dimensional MRCP images were rendered by post processing. CONTRAST:  15mL MULTIHANCE GADOBENATE DIMEGLUMINE 529 MG/ML IV SOLN COMPARISON:  CT 02/04/2017 FINDINGS: Lower chest: No acute findings. Hepatobiliary: Changes of hepatic cirrhosis are demonstrated. No liver mass identified. Minimal abdominal ascites noted. Gallbladder is distended and contains small gallstones. There is mild pericholecystic fluid, without significant gallbladder wall thickening. Although this could be secondary to hepatic cirrhosis, acute cholecystitis cannot be excluded. Diffuse biliary ductal dilatation is seen, with common bile duct measuring 16 mm. Abrupt stricture of the distal common bile duct is seen. No definite choledocholithiasis seen. Pancreas: No mass or inflammatory changes. No evidence of pancreatic ductal dilatation or pancreas divisum. Spleen: Mild splenomegaly, measuring 15 cm in length. No splenic masses identified. This is consistent portal venous hypertension. Adrenals/Urinary Tract: No masses identified. No evidence of hydronephrosis. Stomach/Bowel: Visualized portions within the abdomen are unremarkable. Vascular/Lymphatic: No pathologically enlarged lymph nodes identified. No abdominal aortic aneurysm. Other:  None. Musculoskeletal:  No suspicious bone lesions identified. IMPRESSION: Cholelithiasis. Gallbladder is distended with mild pericholecystic, but no significant gallbladder wall thickening. Although this could be secondary to hepatic cirrhosis, acute cholecystitis cannot definitely be excluded. Recommend clinical correlation, and consider nuclear medicine hepatobiliary scan if warranted clinically. Diffuse biliary ductal dilatation, with abrupt stricture of the distal common bile duct. No definite mass or choledocholithiasis identified. Consider ERCP for further evaluation. Hepatic cirrhosis.  No  evidence of hepatic neoplasm. Mild splenomegaly and minimal ascites, consistent with portal venous hypertension. Electronically Signed   By: Myles Rosenthal M.D.   On: 02/05/2017 08:43  [4 week]   Impression: 56 year old female presenting with malodorous urine, lower abdominal discomfort, mild dysuria. Urinalysis positive, culture pending. CT with distended gallbladder with mild gallbladder wall thickening, dilation of the CBD 1.7 cm. At least one stone noted in the gallbladder. AST minimally elevated and otherwise LFTs are normal. MRI performed today with cholelithiasis, hepatic cirrhosis, gallbladder is distended with mild pericholecystic fluid but no gallbladder wall thickening. Findings could be secondary to hepatic cirrhosis but acute cholecystitis cannot be excluded. She had diffuse biliary ductal dilation with the CBD is 16 mm, abrupt stricture of the distal common bile duct seen but no choledocholithiasis.  It is unclear if her current symptoms are related to gallbladder distention and biliary dilation. She has minimal abdominal tenderness in the right upper quadrant. No nausea or vomiting or difficulty eating. LFTs unremarkable. We may be able  to manage her as an outpatient regarding these findings. Given her significant thrombocytopenia would not advise for ERCP locally as she is high risk for bleeding with ERCP interventions such as sphincterotomy. She may be best served with EUS to determine etiology of distal CBD stricture prior to consideration of ERCP in this setting and both can be done at [facility].   New diagnosis of cirrhosis, chronic hep C dating back to 2001, significant thrombocytopenia. Cannot rule out intermittent hepatic encephalopathy based on her history. Ultimately she will need EGD and a colonoscopy. We will plan on referring her to the [facility] liver care clinic in [facility] for management of her hepatitis C given decompensated cirrhosis.  Plan: 1. Patient  needs to follow-up with PCP regarding reported abnormal mammography in [facility]. 2. We will plan on outpatient referral to [facility] liver care clinic in [facility] for management of her chronic hepatitis C and evaluation for decompensated cirrhosis.  3. Hepatitis B core total, hepatitis A total, hepatitis B surface antigen, PT/INR this admission. 4. Add lactulose, titrate to 3 soft BMs daily. 5. Ultimately will require EGD and colonoscopy which be performed as an outpatient. 6. If clinically her biliary dilation remains stable, outpatient workup can be performed as outlined above. 7. Management of UTI per attending.  We would like to thank you for the opportunity to participate in the care of YOSSELIN ZOELLER.  Leanna Battles. Dixon Boos [facility] [phone] 3/1/20189:47 AM     LOS: 0 days

## 2017-02-05 NOTE — Progress Notes (Signed)
PROGRESS NOTE    Cassandra Montes  AVW:[id number] DOB: [birth year] DOA: 02/04/2017 PCP: No PCP Per Patient   Brief Narrative:  Cassandra Montes is an 56 y.o. female with hx of hep C, not yet Tx, anemia, tobacco abuse, COPD, moved from [facility] to this area, so she hadn't gotten established with any GI doc, presented to the ER actually complaining of suprapubic pain, foul smelling urine, and dyspareunia. She has recurrent UTI's and had been Tx with oral antibiotic without relief. She has no trouble eating or having abdominal pain.  Evaluation in the ER showed normal LFT and lipase, normal WBC and Hb with platelet of 88K. UA showed evidence of infection.  CT of the abd showed dilated CBD to 1.7cm with pericholecystic fluid, mildly thickened wall, and dilated.  EDP spoke with Dr Darrick Penna of GI, and she recommended an MRCP.  Hospitalist was asked to admit her for further evaluation. Gastroenterolgy was consulted and MRCP showed diffuse biliary ductal dilatation with abrupt stricture of the distal common bile duct. ERCP was recommended but GI stated it would be best done at a tertiatry referral center and can be done on a more elective basis and benefit from EUS/possible ERCP. General Surgery was consulted for evaluation of possible acute cholecystitis.   Assessment & Plan:   Principal Problem:   UTI (urinary tract infection) Active Problems:   Choledocholithiasis   Hepatitis C   Tobacco abuse   COPD (chronic obstructive pulmonary disease) (HCC)   Thrombocytopenia (HCC)   Dilation of biliary tract   Lower abdominal pain   Cirrhosis of liver without ascites (HCC)  Urinary Tract Infection -Her complaint is actually in the suprapubic region, and given hx of prior antibiotics, will Continue Tx with Unasyn to cover enterococcus UTI.  -Urine Cx pending -Narrow Abx Therapy based on Urine Cx Results  Biliary Tree Ductal Dilatation/Choledocholithiasis:   -She has CT with evidence of dilated CBD, but  she really has no symptoms, normal LFTs, and no evidence of acute cholecystitis. -MRCP showed cholelithiasis, hepatic cirrhosis, gallbladder is distended with mild pericholecystic fluid but no gallbladder wall thickening. Findings could be secondary to hepatic cirrhosis but acute cholecystitis cannot be excluded. She had diffuse biliary ductal dilation with the CBD is 16 mm, abrupt stricture of the distal common bile duct seen but no choledocholithiasis. -Per GI Recc's Dilated biliary tree needs further evaluation. This is best done at a tertiary referral center and can be done on a more elective basis. Dr. Jena Gauss suspects she may benefit from an EUS/possible ERCP.  ? Acute Cholecystitis -Will obtain Surgical Consult for further evaluation and Recc's and Discussed with Dr. 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 -Outpatient GI Follow up and referral to [facility] in [place]  COPD:   -Stable -C/w Dulera 2 puff IH BID and with Albuterol Nebs q6hprn  Hepatitis C:  -AST mildly elevated at 49 -Hepatitis Serology ordered by Gastroenterology -She will need follow up with GI in consideration for treatment and referral made to [facility] for Hepatitis C Managment  Hyperammonemia -Ammonia Level was 55 -Started on Lactulose 10 g po BID  Thrombocytopenia -Patient's Platelet went from 88 -> 79 -Likely from Splenomegaly and Cirrhosis from Hepatitis C  DVT prophylaxis: SCDs because of Thrombocytopenia Code Status: FULL CODE Family Communication: Discussed with Fiance at Bedside Disposition Plan: Home at D/C after Surgical Evaluation if stable  Consultants:   Gastroenterology  General Surgery   Procedures: MRCP  Antimicrobials:  Anti-infectives    Start     Dose/Rate Route Frequency Ordered Stop   02/05/17 0030  ampicillin-sulbactam (UNASYN) 1.5 g in sodium chloride 0.9 % 50 mL IVPB     1.5 g 100 mL/hr over 30  Minutes Intravenous Every 6 hours 02/04/17 2346       Subjective: Seen and examined at bedside and had just come back from MRCP. Was hungry. Complained of Abdominal Pain but more lower abdominal pain rather than midepigastric. Had some nausea but no vomiting. No other complaints or concerns.  Objective: Vitals:   02/04/17 2200 02/04/17 2350 02/05/17 1032 02/05/17 1419  BP: 175/84 (!) 169/92  (!) 151/70  Pulse: 94 80  81  Resp: 18 18  18   Temp:  98.2 F (36.8 C)  98.6 F (37 C)  TempSrc:  Oral    SpO2: 100% 100% 98% 97%  Weight:  75.3 kg (166 lb 1.6 oz)    Height:  5\' 5"  (1.651 m)      Intake/Output Summary (Last 24 hours) at 02/05/17 1943 Last data filed at 02/05/17 1300  Gross per 24 hour  Intake              248 ml  Output                0 ml  Net              248 ml   Filed Weights   02/04/17 1712 02/04/17 2350  Weight: 72.6 kg (160 lb) 75.3 kg (166 lb 1.6 oz)   Examination: Physical Exam:  Constitutional: NAD and appears calm and comfortable Eyes: Lids and conjunctivae normal, sclerae anicteric  ENMT: External Ears, Nose appear normal. Grossly normal hearing. Poor Dentition Neck: Appears normal, supple, no cervical masses, normal ROM, no appreciable thyromegaly, no JVD Respiratory: Diminished to auscultation bilaterally, no wheezing, rales, rhonchi or crackles. Normal respiratory effort and patient is not tachypenic. No accessory muscle use.  Cardiovascular: RRR, no murmurs / rubs / gallops. S1 and S2 auscultated. No extremity edema.  Abdomen: Soft, mildly tender to palpate, non-distended. No masses palpated. No appreciable hepatosplenomegaly. Bowel sounds positive.  GU: Deferred. Musculoskeletal: No clubbing / cyanosis of digits/nails. No joint deformity upper and lower extremities.  Skin: No rashes, lesions, ulcers on limited skin evaluation. No induration; Warm and dry.  Neurologic: CN 2-12 grossly intact with no focal deficits. Romberg sign cerebellar reflexes not  assessed.  Psychiatric: Normal judgment and insight. Alert and oriented x 3. Normal mood and appropriate affect.   Data Reviewed: I have personally reviewed following labs and imaging studies  CBC:  Recent Labs Lab 02/04/17 1940 02/05/17 0529  WBC 6.3 4.8  NEUTROABS 3.3  --   HGB 13.3 11.9*  HCT 40.7 37.5  MCV 90.0 91.2  PLT 88* 79*   Basic Metabolic Panel:  Recent Labs Lab 02/04/17 1940 02/05/17 0529  NA 139 140  K 3.2* 3.7  CL 105 107  CO2 27 27  GLUCOSE 98 91  BUN 8 7  CREATININE 0.63 0.64  CALCIUM 8.9 8.3*   GFR: Estimated Creatinine Clearance: 81.6 mL/min (by C-G formula based on SCr of 0.64 mg/dL). Liver Function Tests:  Recent Labs Lab 02/04/17 1940 02/05/17 0529  AST 49* 42*  ALT 18 14  ALKPHOS 116 92  BILITOT 0.9 1.0  PROT 7.8 6.5  ALBUMIN 3.3* 2.7*    Recent Labs Lab 02/04/17 1940  LIPASE 28  Recent Labs Lab 02/05/17 0020  AMMONIA 55*   Coagulation Profile:  Recent Labs Lab 02/05/17 1016  INR 1.28   Cardiac Enzymes: No results for input(s): CKTOTAL, CKMB, CKMBINDEX, TROPONINI in the last 168 hours. BNP (last 3 results) No results for input(s): PROBNP in the last 8760 hours. HbA1C: No results for input(s): HGBA1C in the last 72 hours. CBG: No results for input(s): GLUCAP in the last 168 hours. Lipid Profile: No results for input(s): CHOL, HDL, LDLCALC, TRIG, CHOLHDL, LDLDIRECT in the last 72 hours. Thyroid Function Tests:  Recent Labs  02/04/17 1940  TSH 2.066   Anemia Panel: No results for input(s): VITAMINB12, FOLATE, FERRITIN, TIBC, IRON, RETICCTPCT in the last 72 hours. Sepsis Labs: No results for input(s): PROCALCITON, LATICACIDVEN in the last 168 hours.  No results found for this or any previous visit (from the past 240 hour(s)).   Radiology Studies: Ct Abdomen Pelvis W Contrast  Result Date: 02/04/2017 CLINICAL DATA:  Acute onset of right flank and right lower quadrant bowel pain. Malodorous urine. Initial  encounter. EXAM: CT ABDOMEN AND PELVIS WITH CONTRAST TECHNIQUE: Multidetector CT imaging of the abdomen and pelvis was performed using the standard protocol following bolus administration of intravenous contrast. CONTRAST:  100mL ISOVUE-300 IOPAMIDOL (ISOVUE-300) INJECTION 61% COMPARISON:  Lumbar spine radiographs from 03/27/2015 FINDINGS: Lower chest: The visualized lung bases are grossly clear. The visualized portions of the mediastinum are unremarkable. Hepatobiliary: The nodular contour of the liver is compatible with hepatic cirrhosis. There is minimal heterogeneity within the liver, without a definite hepatic mass. 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 Mild splenic varices are noted. The gallbladder is diffusely distended, with trace pericholecystic fluid and mild gallbladder wall thickening. The common bile duct is dilated, measuring up to 1.7 cm in diameter, raising suspicion for distal obstruction. A stone is noted dependently within the gallbladder. Pancreas: The pancreas is within normal limits. Spleen: The spleen is enlarged, measuring 15.4 cm in length. Adrenals/Urinary Tract: The adrenal glands are unremarkable in appearance. The kidneys are within normal limits. There is no evidence of hydronephrosis. No renal or ureteral stones are identified. No perinephric stranding is seen. Stomach/Bowel: The stomach is unremarkable in appearance. The small bowel is within normal limits. The appendix is normal in caliber, without evidence of appendicitis. The colon is unremarkable in appearance. Vascular/Lymphatic: Scattered calcification is seen along the abdominal aorta and its branches. The abdominal aorta is otherwise grossly unremarkable. The inferior vena cava is grossly unremarkable. No retroperitoneal lymphadenopathy is seen. No pelvic sidewall lymphadenopathy is identified. Reproductive: The bladder is mildly distended and grossly unremarkable. The uterus is unremarkable in appearance. The  ovaries are relatively symmetric. No suspicious adnexal masses are seen. Other: No additional soft tissue abnormalities are seen. Musculoskeletal: No acute osseous abnormalities are identified. The patient is status post decompression at the mid lumbar spine. Multilevel vacuum phenomenon and endplate sclerotic change are seen along the lumbar spine. The visualized musculature is unremarkable in appearance. IMPRESSION: 1. Diffuse distention of the gallbladder, with trace pericholecystic fluid and mild gallbladder wall thickening, and dilatation of the common bile duct to 1.7 cm. This is suspicious for distal obstruction. Stone noted dependently within the gallbladder. MRCP or ERCP is recommended for further evaluation. 2. 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 Mild splenic varices noted. 3. Splenomegaly. 4. Scattered aortic atherosclerosis. 5. Mild degenerative change along the lumbar spine. Electronically Signed   By: Roanna RaiderJeffery  Chang M.D.   On: 02/04/2017 21:19  Mr 3d Recon At Scanner  Result Date: 02/05/2017 CLINICAL DATA:  Right abdominal pain. Cholelithiasis. Biliary ductal dilatation seen on recent CT. EXAM: MRI ABDOMEN WITHOUT AND WITH CONTRAST (INCLUDING MRCP) TECHNIQUE: Multiplanar multisequence MR imaging of the abdomen was performed both before and after the administration of intravenous contrast. Heavily T2-weighted images of the biliary and pancreatic ducts were obtained, and three-dimensional MRCP images were rendered by post processing. CONTRAST:  15mL MULTIHANCE GADOBENATE DIMEGLUMINE 529 MG/ML IV SOLN COMPARISON:  CT 02/04/2017 FINDINGS: Lower chest: No acute findings. Hepatobiliary: Changes of hepatic cirrhosis are demonstrated. No liver mass identified. Minimal abdominal ascites noted. Gallbladder is distended and contains small gallstones. There is mild pericholecystic fluid, without significant gallbladder wall thickening. Although this could be secondary  to hepatic cirrhosis, acute cholecystitis cannot be excluded. Diffuse biliary ductal dilatation is seen, with common bile duct measuring 16 mm. Abrupt stricture of the distal common bile duct is seen. No definite choledocholithiasis seen. Pancreas: No mass or inflammatory changes. No evidence of pancreatic ductal dilatation or pancreas divisum. Spleen: Mild splenomegaly, measuring 15 cm in length. No splenic masses identified. This is consistent portal venous hypertension. Adrenals/Urinary Tract: No masses identified. No evidence of hydronephrosis. Stomach/Bowel: Visualized portions within the abdomen are unremarkable. Vascular/Lymphatic: No pathologically enlarged lymph nodes identified. No abdominal aortic aneurysm. Other:  None. Musculoskeletal:  No suspicious bone lesions identified. IMPRESSION: Cholelithiasis. Gallbladder is distended with mild pericholecystic, but no significant gallbladder wall thickening. Although this could be secondary to hepatic cirrhosis, acute cholecystitis cannot definitely be excluded. Recommend clinical correlation, and consider nuclear medicine hepatobiliary scan if warranted clinically. Diffuse biliary ductal dilatation, with abrupt stricture of the distal common bile duct. No definite mass or choledocholithiasis identified. Consider ERCP for further evaluation. Hepatic cirrhosis.  No evidence of hepatic neoplasm. Mild splenomegaly and minimal ascites, consistent with portal venous hypertension. Electronically Signed   By: Myles Rosenthal M.D.   On: 02/05/2017 08:43   Mr Abdomen Mrcp Vivien Rossetti Contast  Result Date: 02/05/2017 CLINICAL DATA:  Right abdominal pain. Cholelithiasis. Biliary ductal dilatation seen on recent CT. EXAM: MRI ABDOMEN WITHOUT AND WITH CONTRAST (INCLUDING MRCP) TECHNIQUE: Multiplanar multisequence MR imaging of the abdomen was performed both before and after the administration of intravenous contrast. Heavily T2-weighted images of the biliary and pancreatic ducts  were obtained, and three-dimensional MRCP images were rendered by post processing. CONTRAST:  15mL MULTIHANCE GADOBENATE DIMEGLUMINE 529 MG/ML IV SOLN COMPARISON:  CT 02/04/2017 FINDINGS: Lower chest: No acute findings. Hepatobiliary: Changes of hepatic cirrhosis are demonstrated. No liver mass identified. Minimal abdominal ascites noted. Gallbladder is distended and contains small gallstones. There is mild pericholecystic fluid, without significant gallbladder wall thickening. Although this could be secondary to hepatic cirrhosis, acute cholecystitis cannot be excluded. Diffuse biliary ductal dilatation is seen, with common bile duct measuring 16 mm. Abrupt stricture of the distal common bile duct is seen. No definite choledocholithiasis seen. Pancreas: No mass or inflammatory changes. No evidence of pancreatic ductal dilatation or pancreas divisum. Spleen: Mild splenomegaly, measuring 15 cm in length. No splenic masses identified. This is consistent portal venous hypertension. Adrenals/Urinary Tract: No masses identified. No evidence of hydronephrosis. Stomach/Bowel: Visualized portions within the abdomen are unremarkable. Vascular/Lymphatic: No pathologically enlarged lymph nodes identified. No abdominal aortic aneurysm. Other:  None. Musculoskeletal:  No suspicious bone lesions identified. IMPRESSION: Cholelithiasis. Gallbladder is distended with mild pericholecystic, but no significant gallbladder wall thickening. Although this could be secondary to hepatic cirrhosis, acute cholecystitis cannot definitely be excluded. Recommend clinical correlation,  and consider nuclear medicine hepatobiliary scan if warranted clinically. Diffuse biliary ductal dilatation, with abrupt stricture of the distal common bile duct. No definite mass or choledocholithiasis identified. Consider ERCP for further evaluation. Hepatic cirrhosis.  No evidence of hepatic neoplasm. Mild splenomegaly and minimal ascites, consistent with portal  venous hypertension. Electronically Signed   By: Myles Rosenthal M.D.   On: 02/05/2017 08:43   Scheduled Meds: . ALPRAZolam  0.5 mg Oral BID  . amLODipine  5 mg Oral Daily  . ampicillin-sulbactam (UNASYN) IV  1.5 g Intravenous Q6H  . lactulose  10 g Oral BID  . mometasone-formoterol  2 puff Inhalation BID  . sertraline  50 mg Oral Daily  . sodium chloride flush  3 mL Intravenous Q12H   Continuous Infusions: . dextrose 5 % and 0.9 % NaCl with KCl 20 mEq/L 75 mL/hr at 02/05/17 0024    LOS: 0 days   Merlene Laughter, DO [facility] Pager [phone]  If 7PM-7AM, please contact night-coverage [email or web address] Password [facility] 02/05/2017, 7:43 PM

## 2017-02-06 DIAGNOSIS — K8050 Calculus of bile duct without cholangitis or cholecystitis without obstruction: Secondary | ICD-10-CM

## 2017-02-06 DIAGNOSIS — K7460 Unspecified cirrhosis of liver: Secondary | ICD-10-CM | POA: Diagnosis not present

## 2017-02-06 DIAGNOSIS — J449 Chronic obstructive pulmonary disease, unspecified: Secondary | ICD-10-CM

## 2017-02-06 DIAGNOSIS — D696 Thrombocytopenia, unspecified: Secondary | ICD-10-CM | POA: Diagnosis not present

## 2017-02-06 DIAGNOSIS — I8500 Esophageal varices without bleeding: Secondary | ICD-10-CM

## 2017-02-06 DIAGNOSIS — N390 Urinary tract infection, site not specified: Secondary | ICD-10-CM

## 2017-02-06 DIAGNOSIS — R161 Splenomegaly, not elsewhere classified: Secondary | ICD-10-CM

## 2017-02-06 DIAGNOSIS — K8070 Calculus of gallbladder and bile duct without cholecystitis without obstruction: Secondary | ICD-10-CM

## 2017-02-06 DIAGNOSIS — I864 Gastric varices: Secondary | ICD-10-CM | POA: Diagnosis not present

## 2017-02-06 DIAGNOSIS — K838 Other specified diseases of biliary tract: Secondary | ICD-10-CM | POA: Diagnosis not present

## 2017-02-06 DIAGNOSIS — B182 Chronic viral hepatitis C: Secondary | ICD-10-CM | POA: Diagnosis not present

## 2017-02-06 DIAGNOSIS — R1030 Lower abdominal pain, unspecified: Secondary | ICD-10-CM | POA: Diagnosis not present

## 2017-02-06 LAB — COMPREHENSIVE METABOLIC PANEL
ALBUMIN: 2.6 g/dL — AB (ref 3.5–5.0)
ALK PHOS: 87 U/L (ref 38–126)
ALT: 15 U/L (ref 14–54)
ANION GAP: 4 — AB (ref 5–15)
AST: 44 U/L — ABNORMAL HIGH (ref 15–41)
BILIRUBIN TOTAL: 1 mg/dL (ref 0.3–1.2)
BUN: 8 mg/dL (ref 6–20)
CALCIUM: 8.2 mg/dL — AB (ref 8.9–10.3)
CO2: 27 mmol/L (ref 22–32)
Chloride: 107 mmol/L (ref 101–111)
Creatinine, Ser: 0.65 mg/dL (ref 0.44–1.00)
GFR calc Af Amer: >60 mL/min (ref >60)
GFR calc non Af Amer: >60 mL/min (ref >60)
GLUCOSE: 97 mg/dL (ref 65–99)
Potassium: 4.1 mmol/L (ref 3.5–5.1)
Sodium: 138 mmol/L (ref 135–145)
TOTAL PROTEIN: 6.4 g/dL — AB (ref 6.5–8.1)

## 2017-02-06 LAB — URINE CULTURE

## 2017-02-06 LAB — CBC WITH DIFFERENTIAL/PLATELET
BASOS PCT: 0 %
Basophils Absolute: 0 10*3/uL (ref 0.0–0.1)
EOS ABS: 0.1 10*3/uL (ref 0.0–0.7)
EOS PCT: 3 %
HCT: 36.7 % (ref 36.0–46.0)
Hemoglobin: 11.7 g/dL — ABNORMAL LOW (ref 12.0–15.0)
LYMPHS ABS: 1.7 10*3/uL (ref 0.7–4.0)
Lymphocytes Relative: 37 %
MCH: 29.1 pg (ref 26.0–34.0)
MCHC: 31.9 g/dL (ref 30.0–36.0)
MCV: 91.3 fL (ref 78.0–100.0)
Monocytes Absolute: 0.4 10*3/uL (ref 0.1–1.0)
Monocytes Relative: 9 %
Neutro Abs: 2.4 10*3/uL (ref 1.7–7.7)
Neutrophils Relative %: 51 %
PLATELETS: 84 10*3/uL — AB (ref 150–400)
RBC: 4.02 MIL/uL (ref 3.87–5.11)
RDW: 15.4 % (ref 11.5–15.5)
WBC: 4.7 10*3/uL (ref 4.0–10.5)

## 2017-02-06 LAB — HEPATITIS A ANTIBODY, TOTAL: Hep A Total Ab: NEGATIVE

## 2017-02-06 LAB — MAGNESIUM: MAGNESIUM: 1.8 mg/dL (ref 1.7–2.4)

## 2017-02-06 LAB — HEPATITIS B CORE ANTIBODY, TOTAL: HEP B C TOTAL AB: NEGATIVE

## 2017-02-06 LAB — HIV ANTIBODY (ROUTINE TESTING W REFLEX): HIV Screen 4th Generation wRfx: NONREACTIVE

## 2017-02-06 LAB — HEPATITIS B SURFACE ANTIGEN: HEP B S AG: NEGATIVE

## 2017-02-06 LAB — PHOSPHORUS: Phosphorus: 3.7 mg/dL (ref 2.5–4.6)

## 2017-02-06 LAB — HEPATITIS B SURFACE ANTIBODY,QUALITATIVE: Hep B S Ab: REACTIVE

## 2017-02-06 MED ORDER — HYDROCODONE-ACETAMINOPHEN 5-325 MG PO TABS: 1.0000 | ORAL_TABLET | Freq: Two times a day (BID) | ORAL | 0 refills | Status: DC | PRN

## 2017-02-06 MED ORDER — AMOXICILLIN-POT CLAVULANATE 875-125 MG PO TABS: 1.0000 | ORAL_TABLET | Freq: Two times a day (BID) | ORAL | 0 refills | Status: DC

## 2017-02-06 MED ORDER — LACTULOSE 10 GM/15ML PO SOLN: 10.0000 g | Freq: Two times a day (BID) | ORAL | 0 refills | Status: DC

## 2017-02-06 NOTE — Consult Note (Signed)
Reason for Consult: Abdominal pain Referring Physician: Dr. Rubbie Battiest Cassandra Montes is an 56 y.o. female.  HPI: Patient is a 56 year old white female who presented to the emergency room on 01/27/2017 with suprapubic pain and dysuria. She has a history of recurrent UTIs. She recently moved here from [facility]. She was admitted to the hospital for further evaluation and treatment. She was noted to have a history of hepatitis C which was not treated. CT scan of the abdomen revealed evidence of splenomegaly, portal venous hypertension, and a thickened gallbladder wall. Ultrasound the gallbladder revealed some mild pericholecystic fluid and gallbladder wall thickening. The patient did complain that her right side did hurt. She complains specifically of epigastric pain, but this does not radiate around the right flank to the back and shoulder blade. She denied any fever, chills, or jaundice. She denied any fatty food intolerance. A HIDA scan was performed which was negative for acute cholecystitis. Surgery is being consulted as to whether the patient would benefit from a cholecystectomy.  Past Medical History:  Diagnosis Date  . Anemia   . Chronic back pain   . Cirrhosis ([place])   . COPD (chronic obstructive pulmonary disease) ([place])   . Depression   . Hepatitis C, chronic ([place])   . Sciatica   . Thrombocytopenia ([place]) 09/2016   due to chronic hepatitis C and splenomegaly    Past Surgical History:  Procedure Laterality Date  . BACK SURGERY    . TUBAL LIGATION      Family History  Problem Relation Age of Onset  . Liver disease Mother     Enlarged  . Cirrhosis Brother     Alcohol and hepatitis C  . Cirrhosis Brother     Alcohol and hepatitis C, received liver transplant    Social History:  reports that she has been smoking Cigarettes.  She has been smoking about 1.00 pack per day. She has never used smokeless tobacco. She reports that she does not drink alcohol or use  drugs.  Allergies:  Allergies  Allergen Reactions  . Neurontin [Gabapentin] Hives    Medications:  Prior to Admission:  Prescriptions Prior to Admission  Medication Sig Dispense Refill Last Dose  . ADVAIR DISKUS 250-50 MCG/DOSE AEPB Inhale 1 puff into the lungs 2 (two) times daily.    02/04/2017 at Unknown time  . ALPRAZolam (XANAX) 0.5 MG tablet Take 0.5 mg by mouth 2 (two) times daily.    02/04/2017 at Unknown time  . amLODipine (NORVASC) 5 MG tablet Take 5 mg by mouth daily.    02/04/2017 at Unknown time  . Aspirin-Acetaminophen-Caffeine (HEADACHE FORMULA PO) Take 1-2 tablets by mouth once as needed (for headache pain).   02/04/2017 at Unknown time  . hydrochlorothiazide (MICROZIDE) 12.5 MG capsule Take 12.5 mg by mouth daily.    02/04/2017 at Unknown time  . sertraline (ZOLOFT) 50 MG tablet Take 50 mg by mouth daily.    02/04/2017 at Unknown time  . traMADol (ULTRAM) 50 MG tablet Take 50 mg by mouth every 6 (six) hours as needed for moderate pain or severe pain.    unknown  . VENTOLIN HFA 108 (90 Base) MCG/ACT inhaler Inhale 1-2 puffs into the lungs every 6 (six) hours as needed for wheezing or shortness of breath.    Past Week at Unknown time   Scheduled: . ALPRAZolam  0.5 mg Oral BID  . amLODipine  5 mg Oral Daily  . ampicillin-sulbactam (UNASYN) IV  1.5 g Intravenous  Q6H  . lactulose  10 g Oral BID  . mometasone-formoterol  2 puff Inhalation BID  . sertraline  50 mg Oral Daily  . sodium chloride flush  3 mL Intravenous Q12H    Results for orders placed or performed during the hospital encounter of 02/04/17 (from the past 48 hour(s))  Urinalysis, Routine w reflex microscopic     Status: Abnormal   Collection Time: 02/04/17  5:16 PM  Result Value Ref Range   Color, Urine YELLOW YELLOW   APPearance CLOUDY (A) CLEAR   Specific Gravity, Urine 1.005 1.005 - 1.030   pH 7.0 5.0 - 8.0   Glucose, UA NEGATIVE NEGATIVE mg/dL   Hgb urine dipstick NEGATIVE NEGATIVE   Bilirubin Urine  NEGATIVE NEGATIVE   Ketones, ur NEGATIVE NEGATIVE mg/dL   Protein, ur NEGATIVE NEGATIVE mg/dL   Nitrite NEGATIVE NEGATIVE   Leukocytes, UA MODERATE (A) NEGATIVE   RBC / HPF 0-5 0 - 5 RBC/hpf   WBC, UA 6-30 0 - 5 WBC/hpf   Bacteria, UA RARE (A) NONE SEEN   Mucous PRESENT    Budding Yeast PRESENT   Comprehensive metabolic panel     Status: Abnormal   Collection Time: 02/04/17  7:40 PM  Result Value Ref Range   Sodium 139 135 - 145 mmol/L   Potassium 3.2 (L) 3.5 - 5.1 mmol/L   Chloride 105 101 - 111 mmol/L   CO2 27 22 - 32 mmol/L   Glucose, Bld 98 65 - 99 mg/dL   BUN 8 6 - 20 mg/dL   Creatinine, Ser 0.63 0.44 - 1.00 mg/dL   Calcium 8.9 8.9 - 10.3 mg/dL   Total Protein 7.8 6.5 - 8.1 g/dL   Albumin 3.3 (L) 3.5 - 5.0 g/dL   AST 49 (H) 15 - 41 U/L   ALT 18 14 - 54 U/L   Alkaline Phosphatase 116 38 - 126 U/L   Total Bilirubin 0.9 0.3 - 1.2 mg/dL   GFR calc non Af Amer >60 >60 mL/min   GFR calc Af Amer >60 >60 mL/min    Comment: (NOTE) The eGFR has been calculated using the CKD EPI equation. This calculation has not been validated in all clinical situations. eGFR's persistently <60 mL/min signify possible Chronic Kidney Disease.    Anion gap 7 5 - 15  Lipase, blood     Status: None   Collection Time: 02/04/17  7:40 PM  Result Value Ref Range   Lipase 28 11 - 51 U/L  CBC with Differential     Status: Abnormal   Collection Time: 02/04/17  7:40 PM  Result Value Ref Range   WBC 6.3 4.0 - 10.5 K/uL   RBC 4.52 3.87 - 5.11 MIL/uL   Hemoglobin 13.3 12.0 - 15.0 g/dL   HCT 40.7 36.0 - 46.0 %   MCV 90.0 78.0 - 100.0 fL   MCH 29.4 26.0 - 34.0 pg   MCHC 32.7 30.0 - 36.0 g/dL   RDW 15.2 11.5 - 15.5 %   Platelets 88 (L) 150 - 400 K/uL    Comment: SPECIMEN CHECKED FOR CLOTS   Neutrophils Relative % 52 %   Neutro Abs 3.3 1.7 - 7.7 K/uL   Lymphocytes Relative 39 %   Lymphs Abs 2.5 0.7 - 4.0 K/uL   Monocytes Relative 7 %   Monocytes Absolute 0.5 0.1 - 1.0 K/uL   Eosinophils Relative 1  %   Eosinophils Absolute 0.1 0.0 - 0.7 K/uL   Basophils Relative 1 %  Basophils Absolute 0.0 0.0 - 0.1 K/uL  TSH     Status: None   Collection Time: 02/04/17  7:40 PM  Result Value Ref Range   TSH 2.066 0.350 - 4.500 uIU/mL    Comment: Performed by a 3rd Generation assay with a functional sensitivity of <=0.01 uIU/mL.  HIV antibody (Routine Testing)     Status: None   Collection Time: 02/05/17 12:20 AM  Result Value Ref Range   HIV Screen 4th Generation wRfx Non Reactive Non Reactive    Comment: (NOTE) Performed At: [facility] [place], [place] [id number] Lindon Romp MD CB:[id number]   Ammonia     Status: Abnormal   Collection Time: 02/05/17 12:20 AM  Result Value Ref Range   Ammonia 55 (H) 9 - 35 umol/L  Hepatitis B surface antibody     Status: None   Collection Time: 02/05/17 12:20 AM  Result Value Ref Range   Hep B S Ab Reactive     Comment: (NOTE)              Non Reactive: Inconsistent with immunity,                            less than 10 mIU/mL              Reactive:     Consistent with immunity,                            greater than 9.9 mIU/mL Performed At: [facility] [place], [place] [id number] Lindon Romp MD NO:[id number]   Comprehensive metabolic panel     Status: Abnormal   Collection Time: 02/05/17  5:29 AM  Result Value Ref Range   Sodium 140 135 - 145 mmol/L   Potassium 3.7 3.5 - 5.1 mmol/L   Chloride 107 101 - 111 mmol/L   CO2 27 22 - 32 mmol/L   Glucose, Bld 91 65 - 99 mg/dL   BUN 7 6 - 20 mg/dL   Creatinine, Ser 0.64 0.44 - 1.00 mg/dL   Calcium 8.3 (L) 8.9 - 10.3 mg/dL   Total Protein 6.5 6.5 - 8.1 g/dL   Albumin 2.7 (L) 3.5 - 5.0 g/dL   AST 42 (H) 15 - 41 U/L   ALT 14 14 - 54 U/L   Alkaline Phosphatase 92 38 - 126 U/L   Total Bilirubin 1.0 0.3 - 1.2 mg/dL   GFR calc non Af Amer >60 >60 mL/min   GFR calc Af Amer >60 >60 mL/min    Comment: (NOTE) The eGFR has been calculated using the  CKD EPI equation. This calculation has not been validated in all clinical situations. eGFR's persistently <60 mL/min signify possible Chronic Kidney Disease.    Anion gap 6 5 - 15  CBC     Status: Abnormal   Collection Time: 02/05/17  5:29 AM  Result Value Ref Range   WBC 4.8 4.0 - 10.5 K/uL   RBC 4.11 3.87 - 5.11 MIL/uL   Hemoglobin 11.9 (L) 12.0 - 15.0 g/dL   HCT 37.5 36.0 - 46.0 %   MCV 91.2 78.0 - 100.0 fL   MCH 29.0 26.0 - 34.0 pg   MCHC 31.7 30.0 - 36.0 g/dL   RDW 15.5 11.5 - 15.5 %   Platelets 79 (L) 150 - 400 K/uL    Comment: CONSISTENT WITH PREVIOUS  RESULT  Hepatitis B core antibody, total     Status: None   Collection Time: 02/05/17 10:16 AM  Result Value Ref Range   Hep B Core Total Ab Negative Negative    Comment: (NOTE) Performed At: [facility] [place], [place] [id number] Lindon Romp MD ES:[id number]   Hepatitis B surface antigen     Status: None   Collection Time: 02/05/17 10:16 AM  Result Value Ref Range   Hepatitis B Surface Ag Negative Negative    Comment: (NOTE) Performed At: [facility] [place], [place] [id number] Lindon Romp MD [id number]   Hepatitis A antibody, total     Status: None   Collection Time: 02/05/17 10:16 AM  Result Value Ref Range   Hep A Total Ab Negative Negative    Comment: (NOTE) Performed At: [facility] [place], [place] [id number] Lindon Romp MD [id number]   Protime-INR     Status: Abnormal   Collection Time: 02/05/17 10:16 AM  Result Value Ref Range   Prothrombin Time 16.0 (H) 11.4 - 15.2 seconds   INR 1.28   Glucose, capillary     Status: Abnormal   Collection Time: 02/05/17 10:16 PM  Result Value Ref Range   Glucose-Capillary 117 (H) 65 - 99 mg/dL   Comment 1 Notify RN    Comment 2 Document in Chart   Comprehensive metabolic panel     Status: Abnormal   Collection Time: 02/06/17  4:51 AM  Result Value Ref Range   Sodium  138 135 - 145 mmol/L   Potassium 4.1 3.5 - 5.1 mmol/L   Chloride 107 101 - 111 mmol/L   CO2 27 22 - 32 mmol/L   Glucose, Bld 97 65 - 99 mg/dL   BUN 8 6 - 20 mg/dL   Creatinine, Ser 0.65 0.44 - 1.00 mg/dL   Calcium 8.2 (L) 8.9 - 10.3 mg/dL   Total Protein 6.4 (L) 6.5 - 8.1 g/dL   Albumin 2.6 (L) 3.5 - 5.0 g/dL   AST 44 (H) 15 - 41 U/L   ALT 15 14 - 54 U/L   Alkaline Phosphatase 87 38 - 126 U/L   Total Bilirubin 1.0 0.3 - 1.2 mg/dL   GFR calc non Af Amer >60 >60 mL/min   GFR calc Af Amer >60 >60 mL/min    Comment: (NOTE) The eGFR has been calculated using the CKD EPI equation. This calculation has not been validated in all clinical situations. eGFR's persistently <60 mL/min signify possible Chronic Kidney Disease.    Anion gap 4 (L) 5 - 15  Magnesium     Status: None   Collection Time: 02/06/17  4:51 AM  Result Value Ref Range   Magnesium 1.8 1.7 - 2.4 mg/dL  Phosphorus     Status: None   Collection Time: 02/06/17  4:51 AM  Result Value Ref Range   Phosphorus 3.7 2.5 - 4.6 mg/dL  CBC with Differential/Platelet     Status: Abnormal   Collection Time: 02/06/17  4:51 AM  Result Value Ref Range   WBC 4.7 4.0 - 10.5 K/uL   RBC 4.02 3.87 - 5.11 MIL/uL   Hemoglobin 11.7 (L) 12.0 - 15.0 g/dL   HCT 36.7 36.0 - 46.0 %   MCV 91.3 78.0 - 100.0 fL   MCH 29.1 26.0 - 34.0 pg   MCHC 31.9 30.0 - 36.0 g/dL   RDW 15.4 11.5 - 15.5 %   Platelets 84 (  L) 150 - 400 K/uL    Comment: CONSISTENT WITH PREVIOUS RESULT   Neutrophils Relative % 51 %   Neutro Abs 2.4 1.7 - 7.7 K/uL   Lymphocytes Relative 37 %   Lymphs Abs 1.7 0.7 - 4.0 K/uL   Monocytes Relative 9 %   Monocytes Absolute 0.4 0.1 - 1.0 K/uL   Eosinophils Relative 3 %   Eosinophils Absolute 0.1 0.0 - 0.7 K/uL   Basophils Relative 0 %   Basophils Absolute 0.0 0.0 - 0.1 K/uL    Ct Abdomen Pelvis W Contrast  Result Date: 02/04/2017 CLINICAL DATA:  Acute onset of right flank and right lower quadrant bowel pain. Malodorous urine.  Initial encounter. EXAM: CT ABDOMEN AND PELVIS WITH CONTRAST TECHNIQUE: Multidetector CT imaging of the abdomen and pelvis was performed using the standard protocol following bolus administration of intravenous contrast. CONTRAST:  133m ISOVUE-300 IOPAMIDOL (ISOVUE-300) INJECTION 61% COMPARISON:  Lumbar spine radiographs from 03/27/2015 FINDINGS: Lower chest: The visualized lung bases are grossly clear. The visualized portions of the mediastinum are unremarkable. Hepatobiliary: The nodular contour of the liver is compatible with hepatic cirrhosis. There is minimal heterogeneity within the liver, without a definite hepatic mass. 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 Mild splenic varices are noted. The gallbladder is diffusely distended, with trace pericholecystic fluid and mild gallbladder wall thickening. The common bile duct is dilated, measuring up to 1.7 cm in diameter, raising suspicion for distal obstruction. A stone is noted dependently within the gallbladder. Pancreas: The pancreas is within normal limits. Spleen: The spleen is enlarged, measuring 15.4 cm in length. Adrenals/Urinary Tract: The adrenal glands are unremarkable in appearance. The kidneys are within normal limits. There is no evidence of hydronephrosis. No renal or ureteral stones are identified. No perinephric stranding is seen. Stomach/Bowel: The stomach is unremarkable in appearance. The small bowel is within normal limits. The appendix is normal in caliber, without evidence of appendicitis. The colon is unremarkable in appearance. Vascular/Lymphatic: Scattered calcification is seen along the abdominal aorta and its branches. The abdominal aorta is otherwise grossly unremarkable. The inferior vena cava is grossly unremarkable. No retroperitoneal lymphadenopathy is seen. No pelvic sidewall lymphadenopathy is identified. Reproductive: The bladder is mildly distended and grossly unremarkable. The uterus is unremarkable in  appearance. The ovaries are relatively symmetric. No suspicious adnexal masses are seen. Other: No additional soft tissue abnormalities are seen. Musculoskeletal: No acute osseous abnormalities are identified. The patient is status post decompression at the mid lumbar spine. Multilevel vacuum phenomenon and endplate sclerotic change are seen along the lumbar spine. The visualized musculature is unremarkable in appearance. IMPRESSION: 1. Diffuse distention of the gallbladder, with trace pericholecystic fluid and mild gallbladder wall thickening, and dilatation of the common bile duct to 1.7 cm. This is suspicious for distal obstruction. Stone noted dependently within the gallbladder. MRCP or ERCP is recommended for further evaluation. 2. 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 Mild splenic varices noted. 3. Splenomegaly. 4. Scattered aortic atherosclerosis. 5. Mild degenerative change along the lumbar spine. Electronically Signed   By: JGarald BaldingM.D.   On: 02/04/2017 21:19   Mr 3d Recon At Scanner  Result Date: 02/05/2017 CLINICAL DATA:  Right abdominal pain. Cholelithiasis. Biliary ductal dilatation seen on recent CT. EXAM: MRI ABDOMEN WITHOUT AND WITH CONTRAST (INCLUDING MRCP) TECHNIQUE: Multiplanar multisequence MR imaging of the abdomen was performed both before and after the administration of intravenous contrast. Heavily T2-weighted images of the biliary and pancreatic ducts were obtained,  and three-dimensional MRCP images were rendered by post processing. CONTRAST:  7m MULTIHANCE GADOBENATE DIMEGLUMINE 529 MG/ML IV SOLN COMPARISON:  CT 02/04/2017 FINDINGS: Lower chest: No acute findings. Hepatobiliary: Changes of hepatic cirrhosis are demonstrated. No liver mass identified. Minimal abdominal ascites noted. Gallbladder is distended and contains small gallstones. There is mild pericholecystic fluid, without significant gallbladder wall thickening. Although this  could be secondary to hepatic cirrhosis, acute cholecystitis cannot be excluded. Diffuse biliary ductal dilatation is seen, with common bile duct measuring 16 mm. Abrupt stricture of the distal common bile duct is seen. No definite choledocholithiasis seen. Pancreas: No mass or inflammatory changes. No evidence of pancreatic ductal dilatation or pancreas divisum. Spleen: Mild splenomegaly, measuring 15 cm in length. No splenic masses identified. This is consistent portal venous hypertension. Adrenals/Urinary Tract: No masses identified. No evidence of hydronephrosis. Stomach/Bowel: Visualized portions within the abdomen are unremarkable. Vascular/Lymphatic: No pathologically enlarged lymph nodes identified. No abdominal aortic aneurysm. Other:  None. Musculoskeletal:  No suspicious bone lesions identified. IMPRESSION: Cholelithiasis. Gallbladder is distended with mild pericholecystic, but no significant gallbladder wall thickening. Although this could be secondary to hepatic cirrhosis, acute cholecystitis cannot definitely be excluded. Recommend clinical correlation, and consider nuclear medicine hepatobiliary scan if warranted clinically. Diffuse biliary ductal dilatation, with abrupt stricture of the distal common bile duct. No definite mass or choledocholithiasis identified. Consider ERCP for further evaluation. Hepatic cirrhosis.  No evidence of hepatic neoplasm. Mild splenomegaly and minimal ascites, consistent with portal venous hypertension. Electronically Signed   By: JEarle GellM.D.   On: 02/05/2017 08:43   Mr Abdomen Mrcp WMoise BoringContast  Result Date: 02/05/2017 CLINICAL DATA:  Right abdominal pain. Cholelithiasis. Biliary ductal dilatation seen on recent CT. EXAM: MRI ABDOMEN WITHOUT AND WITH CONTRAST (INCLUDING MRCP) TECHNIQUE: Multiplanar multisequence MR imaging of the abdomen was performed both before and after the administration of intravenous contrast. Heavily T2-weighted images of the biliary and  pancreatic ducts were obtained, and three-dimensional MRCP images were rendered by post processing. CONTRAST:  140mMULTIHANCE GADOBENATE DIMEGLUMINE 529 MG/ML IV SOLN COMPARISON:  CT 02/04/2017 FINDINGS: Lower chest: No acute findings. Hepatobiliary: Changes of hepatic cirrhosis are demonstrated. No liver mass identified. Minimal abdominal ascites noted. Gallbladder is distended and contains small gallstones. There is mild pericholecystic fluid, without significant gallbladder wall thickening. Although this could be secondary to hepatic cirrhosis, acute cholecystitis cannot be excluded. Diffuse biliary ductal dilatation is seen, with common bile duct measuring 16 mm. Abrupt stricture of the distal common bile duct is seen. No definite choledocholithiasis seen. Pancreas: No mass or inflammatory changes. No evidence of pancreatic ductal dilatation or pancreas divisum. Spleen: Mild splenomegaly, measuring 15 cm in length. No splenic masses identified. This is consistent portal venous hypertension. Adrenals/Urinary Tract: No masses identified. No evidence of hydronephrosis. Stomach/Bowel: Visualized portions within the abdomen are unremarkable. Vascular/Lymphatic: No pathologically enlarged lymph nodes identified. No abdominal aortic aneurysm. Other:  None. Musculoskeletal:  No suspicious bone lesions identified. IMPRESSION: Cholelithiasis. Gallbladder is distended with mild pericholecystic, but no significant gallbladder wall thickening. Although this could be secondary to hepatic cirrhosis, acute cholecystitis cannot definitely be excluded. Recommend clinical correlation, and consider nuclear medicine hepatobiliary scan if warranted clinically. Diffuse biliary ductal dilatation, with abrupt stricture of the distal common bile duct. No definite mass or choledocholithiasis identified. Consider ERCP for further evaluation. Hepatic cirrhosis.  No evidence of hepatic neoplasm. Mild splenomegaly and minimal ascites,  consistent with portal venous hypertension. Electronically Signed   By: JoSharrie Rothman.  On: 02/05/2017 08:43    ROS:  Pertinent items noted in HPI and remainder of comprehensive ROS otherwise negative.  Blood pressure (!) 130/58, pulse 79, temperature 98.6 F (37 C), temperature source Oral, resp. rate 18, height _0  (1.651 m), weight 166 lb 1.6 oz (75.3 kg), SpO2 98 %. Physical Exam: Well-developed well-nourished white female in no acute distress. Head is normocephalic, atraumatic Neck is supple without JVD Eyes are clear without scleral icterus. Lungs clear auscultation with equal breath sounds bilaterally. Heart examination reveals a regular rate and rhythm without S3, S4, murmurs. Abdomen is soft with the patient pointing to tenderness in the right lower quadrant suprapubic region. When I palpate other areas of her abdomen, she does state that is somewhat uncomfortable, but no rigidity is noted. No hernias are noted. Gastroenterology note reviewed Assessment/Plan: Impression: Decompensated hepatitis C with splenomegaly, thrombocytopenia, portal venous hypertension, hypoalbuminemia, elevated ammonia level. Patient has had an MRCP which shows no choledocholithiasis. Patient does not have an indication for cholecystectomy. Patient is a high risk surgical candidate. Her abdominal pain may be related to her urinary tract infection. Patient may be discharged from the surgical standpoint.  Aviva Signs 02/06/2017, 10:56 AM

## 2017-02-06 NOTE — Progress Notes (Signed)
Patient discharged home with IV removed and site intact. Patient discharged home with all belongings and prescriptions.

## 2017-02-06 NOTE — Discharge Summary (Signed)
Physician Discharge Summary  Cassandra Montes ZOX:[id number] DOB: 06-19-1961 DOA: 02/04/2017  PCP: No PCP Per Patient  Admit date: 02/04/2017 Discharge date: 02/06/2017  Admitted From: Home Disposition:  Home  Recommendations for Outpatient Follow-up:  1. Follow up with PCP in 1-2 weeks 2. Follow up with Gastroenterology Dr. Jena Gauss for follow up and for EGD and Colonoscopy as an outpatient 3. Will need outpatient referral to [facility] in [place] for management of Chronic Hepatitis C and Decompensated Cirrhosis 4. Will need Further Evaluation of Dilated Biliary Tree and it will best be done at a [facility]) and benefit from EUS/Possible ERCP 5. Will need Hepatitis A and B Vaccination 6. Follow up with Oncology Dr. Janyth Contes for Abnormal Mammogram Results showing spot on Left Breast 7. Please obtain BMP/CBC in one week  Home Health: No Equipment/Devices: None  Discharge Condition: Stable CODE STATUS: FULL CODE Diet recommendation: Heart Healthy   Brief/Interim Summary: Doneshia Hill Robertsis an 56 y.o.femalewith hx of hep C, not yet Tx, anemia, tobacco abuse, COPD, moved from [facility] to this area, so she hadn't gotten established with any GI doc, presented to the ER actually complaining of suprapubic pain, foul smelling urine, and dyspareunia. She has recurrent UTI's and had been Tx with oral antibiotic without relief. She has no trouble eating or having abdominal pain. Evaluation in the ER showed slightly elevated ASTand normal lipase, normal WBC and Hb with platelet of 88K. UA showed evidence of infection however Urine Cx showed multiple species present. CT of the abd showed dilated CBD to 1.7cm with pericholecystic fluid, mildly thickened wall, and dilated. EDP spoke with Dr Darrick Penna of GI, and she recommended an MRCP. Hospitalist was asked to admit her for further evaluation. Gastroenterolgy was consulted and MRCP showed diffuse  biliary ductal dilatation with abrupt stricture of the distal common bile duct. ERCP was recommended but GI stated it would be best done at a tertiatry referral center and can be done on a more elective basis and benefit from EUS/possible ERCP. General Surgery was consulted for evaluation of possible acute cholecystitis and had no indication for cholecystectomy. She was deemed a high risk surgical candidate and is not as surgical candidate at this time due to her comorbidities and because it is less likely cholecystitis as an etiology of her symptoms. Patient states this AM she woke up and her urinary symptoms were resolving but returned later in the Am. She again complained of low abdominal pain but not as bad. Patient at this time Deemed medically stable and will follow up with PCP, Gastroenterology, and Oncology as an outpatient and will need to follow up with the Liver Clinic and have [facility] Care Referral made for EUS/possible ERCP.   Discharge Diagnoses:  Principal Problem:   UTI (urinary tract infection) Active Problems:   Choledocholithiasis   Hepatitis C   Tobacco abuse   COPD (chronic obstructive pulmonary disease) (HCC)   Thrombocytopenia (HCC)   Dilation of biliary tract   Abdominal pain   Cirrhosis of liver without ascites (HCC)   Cholelithiasis with choledocholithiasis   Esophageal varices without bleeding (HCC)   Gastric varices   Splenomegaly  Urinary Tract Infection -Her complaint is actually in the suprapubic region, and given hx of prior antibiotics was started onTx with Unasyn to cover enterococcus UTI.  -Changed Unasyn to Augmentin 875-125 mg po BID x 5 days -Urine Cx showed Multiple Species -Still symptomatic but improving  Biliary Tree Ductal  Dilatation:  -She has CT with evidence of dilated CBD, but she really has no symptoms, normal LFTs, and no evidence of acute cholecystitis. -MRCP showed cholelithiasis, hepatic cirrhosis, gallbladder is distended with mild  pericholecystic fluid but no gallbladder wall thickening. Findings could be secondary to hepatic cirrhosis but acute cholecystitis cannot be excluded. She had diffuse biliary ductal dilation with the CBD is 16 mm, abrupt stricture of the distal common bile duct seen but no choledocholithiasis. -Asked General Surgery to assess and they do not feel like she has active cholecystitis  -Per GI Recc's Dilated biliary tree needs further evaluation. This is best done at a tertiary referral center and can be done on a more elective basis. Dr. Jena Gauss suspects she may benefit from an EUS/possible ERCP.  ? Acute Cholecystitis r/o'd Out -Obtained Surgical Consultation from Dr. 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 -Outpatient GI Follow up and referral to [facility] in [place] -Will need outpatient GI follow up and will need eventual EGD and Colonoscopy  COPD:  -Stable -C/w Home medications at D/C  Hepatitis C:  -AST mildly elevated at 44 -Hepatitis Serology ordered by Gastroenterology -She will need follow up with GI in consideration for treatment and referral made to [facility] for Hepatitis C Management -Will need eventual Hepatitis A and B vaccinations  Hyperammonemia -Ammonia Level was 55 -Started on Lactulose 10 g po BID and will continue at D/C  Thrombocytopenia -Patient's Platelet went from 88 -> 79 -> 84 -Likely from Splenomegaly and Cirrhosis from Hepatitis C -Repeat CBC as an outpatient  Abnormal Mammogram and Spot on Left Breast -Follow up with Dr. Janyth Contes as an outpatient at appointment scheduled in April   Discharge Instructions  Discharge Instructions    Call MD for:  difficulty breathing, headache or visual disturbances    Complete by:  As directed    Call  MD for:  extreme fatigue    Complete by:  As directed    Call MD for:  persistant dizziness or light-headedness    Complete by:  As directed    Call MD for:  persistant nausea and vomiting    Complete by:  As directed    Call MD for:  severe uncontrolled pain    Complete by:  As directed    Call MD for:  temperature >100.4    Complete by:  As directed    Diet - low sodium heart healthy    Complete by:  As directed    Discharge instructions    Complete by:  As directed    Follow up with PCP within 1 week and with Gastroenterology as an outpatient. Establish with the Liver Clinic in [place] for treatment of Hepatitis C. Have PCP or GI refer to [facility] for EUS/ERCP. Take all medications as prescribed. If symptoms change or worsen please return to the ED or PCP for evaluation.   Increase activity slowly    Complete by:  As directed      Allergies as of 02/06/2017      Reactions   Neurontin [gabapentin] Hives      Medication List    STOP taking these medications   traMADol 50 MG tablet Commonly known as:  ULTRAM     TAKE these medications  ADVAIR DISKUS 250-50 MCG/DOSE Aepb Generic drug:  Fluticasone-Salmeterol Inhale 1 puff into the lungs 2 (two) times daily.   ALPRAZolam 0.5 MG tablet Commonly known as:  XANAX Take 0.5 mg by mouth 2 (two) times daily.   amLODipine 5 MG tablet Commonly known as:  NORVASC Take 5 mg by mouth daily.   amoxicillin-clavulanate 875-125 MG tablet Commonly known as:  AUGMENTIN Take 1 tablet by mouth 2 (two) times daily.   HEADACHE FORMULA PO Take 1-2 tablets by mouth once as needed (for headache pain).   hydrochlorothiazide 12.5 MG capsule Commonly known as:  MICROZIDE Take 12.5 mg by mouth daily.   HYDROcodone-acetaminophen 5-325 MG tablet Commonly known as:  NORCO Take 1 tablet by mouth every 12 (twelve) hours as needed for moderate pain.   lactulose 10 GM/15ML solution Commonly known as:  CHRONULAC Take 15 mLs (10 g  total) by mouth 2 (two) times daily.   sertraline 50 MG tablet Commonly known as:  ZOLOFT Take 50 mg by mouth daily.   VENTOLIN HFA 108 (90 Base) MCG/ACT inhaler Generic drug:  albuterol Inhale 1-2 puffs into the lungs every 6 (six) hours as needed for wheezing or shortness of breath.      Follow-up Information    Ralene Cork, MD Follow up on 03/25/2017.   Specialty:  Oncology Why:  at 1:00 pm Contact information: [address] [place] [address] [phone]        Eula Listen, MD. Call in 1 week(s).   Specialty:  Gastroenterology Why:  Follow up with Gastroenterology as an outpatient and establish with Liver Clinic for treatment of Hepatits C Contact information: [address] [place] [address] [phone]          Allergies  Allergen Reactions  . Neurontin [Gabapentin] Hives   Consultations:  Gastroenterology  General Surgery  Procedures/Studies: Ct Abdomen Pelvis W Contrast  Result Date: 02/04/2017 CLINICAL DATA:  Acute onset of right flank and right lower quadrant bowel pain. Malodorous urine. Initial encounter. EXAM: CT ABDOMEN AND PELVIS WITH CONTRAST TECHNIQUE: Multidetector CT imaging of the abdomen and pelvis was performed using the standard protocol following bolus administration of intravenous contrast. CONTRAST:  ISOVUE-300 IOPAMIDOL (ISOVUE-300) INJECTION 61% COMPARISON:  Lumbar spine radiographs from 03/27/2015 FINDINGS: Lower chest: The visualized lung bases are grossly clear. The visualized portions of the mediastinum are unremarkable. Hepatobiliary: The nodular contour of the liver is compatible with hepatic cirrhosis. There is minimal heterogeneity within the liver, without a definite hepatic mass. 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 Mild splenic varices are noted. The gallbladder is diffusely distended, with trace pericholecystic fluid and mild gallbladder wall thickening. The common bile duct is dilated, measuring up  to 1.7 cm in diameter, raising suspicion for distal obstruction. A stone is noted dependently within the gallbladder. Pancreas: The pancreas is within normal limits. Spleen: The spleen is enlarged, measuring 15.4 cm in length. Adrenals/Urinary Tract: The adrenal glands are unremarkable in appearance. The kidneys are within normal limits. There is no evidence of hydronephrosis. No renal or ureteral stones are identified. No perinephric stranding is seen. Stomach/Bowel: The stomach is unremarkable in appearance. The small bowel is within normal limits. The appendix is normal in caliber, without evidence of appendicitis. The colon is unremarkable in appearance. Vascular/Lymphatic: Scattered calcification is seen along the abdominal aorta and its branches. The abdominal aorta is otherwise grossly unremarkable. The inferior vena cava is grossly unremarkable. No retroperitoneal lymphadenopathy is seen. No pelvic sidewall lymphadenopathy is identified.  Reproductive: The bladder is mildly distended and grossly unremarkable. The uterus is unremarkable in appearance. The ovaries are relatively symmetric. No suspicious adnexal masses are seen. Other: No additional soft tissue abnormalities are seen. Musculoskeletal: No acute osseous abnormalities are identified. The patient is status post decompression at the mid lumbar spine. Multilevel vacuum phenomenon and endplate sclerotic change are seen along the lumbar spine. The visualized musculature is unremarkable in appearance. IMPRESSION: 1. Diffuse distention of the gallbladder, with trace pericholecystic fluid and mild gallbladder wall thickening, and dilatation of the common bile duct to 1.7 cm. This is suspicious for distal obstruction. Stone noted dependently within the gallbladder. MRCP or ERCP is recommended for further evaluation. 2. 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 Mild splenic varices noted. 3. Splenomegaly. 4. Scattered  aortic atherosclerosis. 5. Mild degenerative change along the lumbar spine. Electronically Signed   By: Roanna Raider M.D.   On: 02/04/2017 21:19   Mr 3d Recon At Scanner  Result Date: 02/05/2017 CLINICAL DATA:  Right abdominal pain. Cholelithiasis. Biliary ductal dilatation seen on recent CT. EXAM: MRI ABDOMEN WITHOUT AND WITH CONTRAST (INCLUDING MRCP) TECHNIQUE: Multiplanar multisequence MR imaging of the abdomen was performed both before and after the administration of intravenous contrast. Heavily T2-weighted images of the biliary and pancreatic ducts were obtained, and three-dimensional MRCP images were rendered by post processing. CONTRAST:  15mL MULTIHANCE GADOBENATE DIMEGLUMINE 529 MG/ML IV SOLN COMPARISON:  CT 02/04/2017 FINDINGS: Lower chest: No acute findings. Hepatobiliary: Changes of hepatic cirrhosis are demonstrated. No liver mass identified. Minimal abdominal ascites noted. Gallbladder is distended and contains small gallstones. There is mild pericholecystic fluid, without significant gallbladder wall thickening. Although this could be secondary to hepatic cirrhosis, acute cholecystitis cannot be excluded. Diffuse biliary ductal dilatation is seen, with common bile duct measuring 16 mm. Abrupt stricture of the distal common bile duct is seen. No definite choledocholithiasis seen. Pancreas: No mass or inflammatory changes. No evidence of pancreatic ductal dilatation or pancreas divisum. Spleen: Mild splenomegaly, measuring 15 cm in length. No splenic masses identified. This is consistent portal venous hypertension. Adrenals/Urinary Tract: No masses identified. No evidence of hydronephrosis. Stomach/Bowel: Visualized portions within the abdomen are unremarkable. Vascular/Lymphatic: No pathologically enlarged lymph nodes identified. No abdominal aortic aneurysm. Other:  None. Musculoskeletal:  No suspicious bone lesions identified. IMPRESSION: Cholelithiasis. Gallbladder is distended with mild  pericholecystic, but no significant gallbladder wall thickening. Although this could be secondary to hepatic cirrhosis, acute cholecystitis cannot definitely be excluded. Recommend clinical correlation, and consider nuclear medicine hepatobiliary scan if warranted clinically. Diffuse biliary ductal dilatation, with abrupt stricture of the distal common bile duct. No definite mass or choledocholithiasis identified. Consider ERCP for further evaluation. Hepatic cirrhosis.  No evidence of hepatic neoplasm. Mild splenomegaly and minimal ascites, consistent with portal venous hypertension. Electronically Signed   By: Myles Rosenthal M.D.   On: 02/05/2017 08:43   Mr Abdomen Mrcp Vivien Rossetti Contast  Result Date: 02/05/2017 CLINICAL DATA:  Right abdominal pain. Cholelithiasis. Biliary ductal dilatation seen on recent CT. EXAM: MRI ABDOMEN WITHOUT AND WITH CONTRAST (INCLUDING MRCP) TECHNIQUE: Multiplanar multisequence MR imaging of the abdomen was performed both before and after the administration of intravenous contrast. Heavily T2-weighted images of the biliary and pancreatic ducts were obtained, and three-dimensional MRCP images were rendered by post processing. CONTRAST:  15mL MULTIHANCE GADOBENATE DIMEGLUMINE 529 MG/ML IV SOLN COMPARISON:  CT 02/04/2017 FINDINGS: Lower chest: No acute findings. Hepatobiliary: Changes of hepatic cirrhosis are demonstrated. No liver mass identified.  Minimal abdominal ascites noted. Gallbladder is distended and contains small gallstones. There is mild pericholecystic fluid, without significant gallbladder wall thickening. Although this could be secondary to hepatic cirrhosis, acute cholecystitis cannot be excluded. Diffuse biliary ductal dilatation is seen, with common bile duct measuring 16 mm. Abrupt stricture of the distal common bile duct is seen. No definite choledocholithiasis seen. Pancreas: No mass or inflammatory changes. No evidence of pancreatic ductal dilatation or pancreas divisum.  Spleen: Mild splenomegaly, measuring 15 cm in length. No splenic masses identified. This is consistent portal venous hypertension. Adrenals/Urinary Tract: No masses identified. No evidence of hydronephrosis. Stomach/Bowel: Visualized portions within the abdomen are unremarkable. Vascular/Lymphatic: No pathologically enlarged lymph nodes identified. No abdominal aortic aneurysm. Other:  None. Musculoskeletal:  No suspicious bone lesions identified. IMPRESSION: Cholelithiasis. Gallbladder is distended with mild pericholecystic, but no significant gallbladder wall thickening. Although this could be secondary to hepatic cirrhosis, acute cholecystitis cannot definitely be excluded. Recommend clinical correlation, and consider nuclear medicine hepatobiliary scan if warranted clinically. Diffuse biliary ductal dilatation, with abrupt stricture of the distal common bile duct. No definite mass or choledocholithiasis identified. Consider ERCP for further evaluation. Hepatic cirrhosis.  No evidence of hepatic neoplasm. Mild splenomegaly and minimal ascites, consistent with portal venous hypertension. Electronically Signed   By: Myles RosenthalJohn  Stahl M.D.   On: 02/05/2017 08:43    Subjective: Seen and examined and was feeling slightly better with abdominal pain. No Nausea or vomiting. No CP or SOB. No other complaints or concerns. Wanting to know when she can go home.   Discharge Exam: Vitals:   02/05/17 2214 02/06/17 0600  BP: 133/63 (!) 130/58  Pulse: (!) 18 79  Resp: 18 18  Temp: 98.6 F (37 C) 98.6 F (37 C)   Vitals:   02/05/17 2214 02/06/17 0600 02/06/17 0816 02/06/17 0826  BP: 133/63 (!) 130/58    Pulse: (!) 18 79    Resp: 18 18    Temp: 98.6 F (37 C) 98.6 F (37 C)    TempSrc: Oral Oral    SpO2: 95% 95% 94% 98%  Weight:      Height:       General: Pt is alert, awake, not in acute distress Cardiovascular: RRR, S1/S2 +, no rubs, no gallops Respiratory: CTA bilaterally, no wheezing, no rhonchi; Patient  not tachypenic or using any accessory muscles to breathe.  Abdominal: Soft, Mildly tender, Non-distended bowel sounds + Extremities: no edema, no cyanosis  The results of significant diagnostics from this hospitalization (including imaging, microbiology, ancillary and laboratory) are listed below for reference.    Microbiology: Recent Results (from the past 240 hour(s))  Urine culture     Status: Abnormal   Collection Time: 02/04/17  7:11 PM  Result Value Ref Range Status   Specimen Description URINE, CLEAN CATCH  Final   Special Requests NONE  Final   Culture MULTIPLE SPECIES PRESENT, SUGGEST RECOLLECTION (A)  Final   Report Status 02/06/2017 FINAL  Final    Labs: BNP (last 3 results) No results for input(s): BNP in the last 8760 hours. Basic Metabolic Panel:  Recent Labs Lab 02/04/17 1940 02/05/17 0529 02/06/17 0451  NA 139 140 138  K 3.2* 3.7 4.1  CL 105 107 107  CO2 27 27 27   GLUCOSE 98 91 97  BUN 8 7 8   CREATININE 0.63 0.64 0.65  CALCIUM 8.9 8.3* 8.2*  MG  --   --  1.8  PHOS  --   --  3.7  Liver Function Tests:  Recent Labs Lab 02/04/17 1940 02/05/17 0529 02/06/17 0451  AST 49* 42* 44*  ALT 18 14 15   ALKPHOS 116 92 87  BILITOT 0.9 1.0 1.0  PROT 7.8 6.5 6.4*  ALBUMIN 3.3* 2.7* 2.6*    Recent Labs Lab 02/04/17 1940  LIPASE 28    Recent Labs Lab 02/05/17 0020  AMMONIA 55*   CBC:  Recent Labs Lab 02/04/17 1940 02/05/17 0529 02/06/17 0451  WBC 6.3 4.8 4.7  NEUTROABS 3.3  --  2.4  HGB 13.3 11.9* 11.7*  HCT 40.7 37.5 36.7  MCV 90.0 91.2 91.3  PLT 88* 79* 84*   Cardiac Enzymes: No results for input(s): CKTOTAL, CKMB, CKMBINDEX, TROPONINI in the last 168 hours. BNP: Invalid input(s): POCBNP CBG:  Recent Labs Lab 02/05/17 2216  GLUCAP 117*   D-Dimer No results for input(s): DDIMER in the last 72 hours. Hgb A1c No results for input(s): HGBA1C in the last 72 hours. Lipid Profile No results for input(s): CHOL, HDL, LDLCALC, TRIG,  CHOLHDL, LDLDIRECT in the last 72 hours. Thyroid function studies  Recent Labs  02/04/17 1940  TSH 2.066   Anemia work up No results for input(s): VITAMINB12, FOLATE, FERRITIN, TIBC, IRON, RETICCTPCT in the last 72 hours. Urinalysis    Component Value Date/Time   COLORURINE YELLOW 02/04/2017 1716   APPEARANCEUR CLOUDY (A) 02/04/2017 1716   LABSPEC 1.005 02/04/2017 1716   PHURINE 7.0 02/04/2017 1716   GLUCOSEU NEGATIVE 02/04/2017 1716   HGBUR NEGATIVE 02/04/2017 1716   BILIRUBINUR NEGATIVE 02/04/2017 1716   KETONESUR NEGATIVE 02/04/2017 1716   PROTEINUR NEGATIVE 02/04/2017 1716   NITRITE NEGATIVE 02/04/2017 1716   LEUKOCYTESUR MODERATE (A) 02/04/2017 1716   Sepsis Labs Invalid input(s): PROCALCITONIN,  WBC,  LACTICIDVEN Microbiology Recent Results (from the past 240 hour(s))  Urine culture     Status: Abnormal   Collection Time: 02/04/17  7:11 PM  Result Value Ref Range Status   Specimen Description URINE, CLEAN CATCH  Final   Special Requests NONE  Final   Culture MULTIPLE SPECIES PRESENT, SUGGEST RECOLLECTION (A)  Final   Report Status 02/06/2017 FINAL  Final   Time coordinating discharge: Over 30 minutes  SIGNED:  Merlene Laughter, DO [facility] 02/06/2017, 2:06 PM Pager [phone]  If 7PM-7AM, please contact night-coverage [email or web address] Password [email or web address]

## 2017-02-06 NOTE — Progress Notes (Signed)
SLF-REVIEWED-NO ADDITIONAL RECOMMENDATIONS.   Subjective: Patient with continued lower abdominal pain today, stating "I just cant seem to shake this kidney infection." No other abdominal pain, N/V. No other GI complaints.  Objective: Vital signs in last 24 hours: Temp:  [98.6 F (37 C)] 98.6 F (37 C) (03/02 0600) Pulse Rate:  [18-81] 79 (03/02 0600) Resp:  [18] 18 (03/02 0600) BP: (130-151)/(58-70) 130/58 (03/02 0600) SpO2:  [94 %-98 %] 98 % (03/02 0826) Last BM Date: 02/04/17 General:   Alert and oriented, pleasant Head:  Normocephalic and atraumatic. Eyes:  No icterus, sclera clear. Conjuctiva pink.  Heart:  S1, S2 present, no murmurs noted.  Lungs: Clear to auscultation bilaterally, without wheezing, rales, or rhonchi.  Abdomen:  Bowel sounds present, soft, non-distended. + lower abdominal TTP. No HSM or hernias noted. No rebound or guarding. Msk:  Symmetrical without gross deformities. Extremities:  Without clubbing or edema. Neurologic:  Alert and  oriented x4;  grossly normal neurologically. Psych:  Alert and cooperative. Normal mood and affect.  Intake/Output from previous day: No intake/output data recorded. Intake/Output this shift: No intake/output data recorded.  Lab Results:  Recent Labs  02/04/17 1940 02/05/17 0529 02/06/17 0451  WBC 6.3 4.8 4.7  HGB 13.3 11.9* 11.7*  HCT 40.7 37.5 36.7  PLT 88* 79* 84*   BMET  Recent Labs  02/04/17 1940 02/05/17 0529 02/06/17 0451  NA 139 140 138  K 3.2* 3.7 4.1  CL 105 107 107  CO2 27 27 27   GLUCOSE 98 91 97  BUN 8 7 8   CREATININE 0.63 0.64 0.65  CALCIUM 8.9 8.3* 8.2*   LFT  Recent Labs  02/04/17 1940 02/05/17 0529 02/06/17 0451  PROT 7.8 6.5 6.4*  ALBUMIN 3.3* 2.7* 2.6*  AST 49* 42* 44*  ALT 18 14 15   ALKPHOS 116 92 87  BILITOT 0.9 1.0 1.0   PT/INR  Recent Labs  02/05/17 1016  LABPROT 16.0*  INR 1.28   Hepatitis Panel  Recent Labs  02/05/17 1016  HEPBSAG Negative      Studies/Results: Ct Abdomen Pelvis W Contrast  Result Date: 02/04/2017 CLINICAL DATA:  Acute onset of right flank and right lower quadrant bowel pain. Malodorous urine. Initial encounter. EXAM: CT ABDOMEN AND PELVIS WITH CONTRAST TECHNIQUE: Multidetector CT imaging of the abdomen and pelvis was performed using the standard protocol following bolus administration of intravenous contrast. CONTRAST:  ISOVUE-300 IOPAMIDOL (ISOVUE-300) INJECTION 61% COMPARISON:  Lumbar spine radiographs from 03/27/2015 FINDINGS: Lower chest: The visualized lung bases are grossly clear. The visualized portions of the mediastinum are unremarkable. Hepatobiliary: The nodular contour of the liver is compatible with hepatic cirrhosis. There is minimal heterogeneity within the liver, without a definite hepatic mass. 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 Mild splenic varices are noted. The gallbladder is diffusely distended, with trace pericholecystic fluid and mild gallbladder wall thickening. The common bile duct is dilated, measuring up to 1.7 cm in diameter, raising suspicion for distal obstruction. A stone is noted dependently within the gallbladder. Pancreas: The pancreas is within normal limits. Spleen: The spleen is enlarged, measuring 15.4 cm in length. Adrenals/Urinary Tract: The adrenal glands are unremarkable in appearance. The kidneys are within normal limits. There is no evidence of hydronephrosis. No renal or ureteral stones are identified. No perinephric stranding is seen. Stomach/Bowel: The stomach is unremarkable in appearance. The small bowel is within normal limits. The appendix is normal in caliber, without evidence of appendicitis. The colon is unremarkable in appearance. Vascular/Lymphatic:  Scattered calcification is seen along the abdominal aorta and its branches. The abdominal aorta is otherwise grossly unremarkable. The inferior vena cava is grossly unremarkable. No retroperitoneal  lymphadenopathy is seen. No pelvic sidewall lymphadenopathy is identified. Reproductive: The bladder is mildly distended and grossly unremarkable. The uterus is unremarkable in appearance. The ovaries are relatively symmetric. No suspicious adnexal masses are seen. Other: No additional soft tissue abnormalities are seen. Musculoskeletal: No acute osseous abnormalities are identified. The patient is status post decompression at the mid lumbar spine. Multilevel vacuum phenomenon and endplate sclerotic change are seen along the lumbar spine. The visualized musculature is unremarkable in appearance. IMPRESSION: 1. Diffuse distention of the gallbladder, with trace pericholecystic fluid and mild gallbladder wall thickening, and dilatation of the common bile duct to 1.7 cm. This is suspicious for distal obstruction. Stone noted dependently within the gallbladder. MRCP or ERCP is recommended for further evaluation. 2. 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 Mild splenic varices noted. 3. Splenomegaly. 4. Scattered aortic atherosclerosis. 5. Mild degenerative change along the lumbar spine. Electronically Signed   By: Roanna Raider M.D.   On: 02/04/2017 21:19   Mr 3d Recon At Scanner  Result Date: 02/05/2017 CLINICAL DATA:  Right abdominal pain. Cholelithiasis. Biliary ductal dilatation seen on recent CT. EXAM: MRI ABDOMEN WITHOUT AND WITH CONTRAST (INCLUDING MRCP) TECHNIQUE: Multiplanar multisequence MR imaging of the abdomen was performed both before and after the administration of intravenous contrast. Heavily T2-weighted images of the biliary and pancreatic ducts were obtained, and three-dimensional MRCP images were rendered by post processing. CONTRAST:  15mL MULTIHANCE GADOBENATE DIMEGLUMINE 529 MG/ML IV SOLN COMPARISON:  CT 02/04/2017 FINDINGS: Lower chest: No acute findings. Hepatobiliary: Changes of hepatic cirrhosis are demonstrated. No liver mass identified. Minimal  abdominal ascites noted. Gallbladder is distended and contains small gallstones. There is mild pericholecystic fluid, without significant gallbladder wall thickening. Although this could be secondary to hepatic cirrhosis, acute cholecystitis cannot be excluded. Diffuse biliary ductal dilatation is seen, with common bile duct measuring 16 mm. Abrupt stricture of the distal common bile duct is seen. No definite choledocholithiasis seen. Pancreas: No mass or inflammatory changes. No evidence of pancreatic ductal dilatation or pancreas divisum. Spleen: Mild splenomegaly, measuring 15 cm in length. No splenic masses identified. This is consistent portal venous hypertension. Adrenals/Urinary Tract: No masses identified. No evidence of hydronephrosis. Stomach/Bowel: Visualized portions within the abdomen are unremarkable. Vascular/Lymphatic: No pathologically enlarged lymph nodes identified. No abdominal aortic aneurysm. Other:  None. Musculoskeletal:  No suspicious bone lesions identified. IMPRESSION: Cholelithiasis. Gallbladder is distended with mild pericholecystic, but no significant gallbladder wall thickening. Although this could be secondary to hepatic cirrhosis, acute cholecystitis cannot definitely be excluded. Recommend clinical correlation, and consider nuclear medicine hepatobiliary scan if warranted clinically. Diffuse biliary ductal dilatation, with abrupt stricture of the distal common bile duct. No definite mass or choledocholithiasis identified. Consider ERCP for further evaluation. Hepatic cirrhosis.  No evidence of hepatic neoplasm. Mild splenomegaly and minimal ascites, consistent with portal venous hypertension. Electronically Signed   By: Myles Rosenthal M.D.   On: 02/05/2017 08:43   Mr Abdomen Mrcp Vivien Rossetti Contast  Result Date: 02/05/2017 CLINICAL DATA:  Right abdominal pain. Cholelithiasis. Biliary ductal dilatation seen on recent CT. EXAM: MRI ABDOMEN WITHOUT AND WITH CONTRAST (INCLUDING MRCP)  TECHNIQUE: Multiplanar multisequence MR imaging of the abdomen was performed both before and after the administration of intravenous contrast. Heavily T2-weighted images of the biliary and pancreatic ducts were obtained, and three-dimensional MRCP  images were rendered by post processing. CONTRAST:  15mL MULTIHANCE GADOBENATE DIMEGLUMINE 529 MG/ML IV SOLN COMPARISON:  CT 02/04/2017 FINDINGS: Lower chest: No acute findings. Hepatobiliary: Changes of hepatic cirrhosis are demonstrated. No liver mass identified. Minimal abdominal ascites noted. Gallbladder is distended and contains small gallstones. There is mild pericholecystic fluid, without significant gallbladder wall thickening. Although this could be secondary to hepatic cirrhosis, acute cholecystitis cannot be excluded. Diffuse biliary ductal dilatation is seen, with common bile duct measuring 16 mm. Abrupt stricture of the distal common bile duct is seen. No definite choledocholithiasis seen. Pancreas: No mass or inflammatory changes. No evidence of pancreatic ductal dilatation or pancreas divisum. Spleen: Mild splenomegaly, measuring 15 cm in length. No splenic masses identified. This is consistent portal venous hypertension. Adrenals/Urinary Tract: No masses identified. No evidence of hydronephrosis. Stomach/Bowel: Visualized portions within the abdomen are unremarkable. Vascular/Lymphatic: No pathologically enlarged lymph nodes identified. No abdominal aortic aneurysm. Other:  None. Musculoskeletal:  No suspicious bone lesions identified. IMPRESSION: Cholelithiasis. Gallbladder is distended with mild pericholecystic, but no significant gallbladder wall thickening. Although this could be secondary to hepatic cirrhosis, acute cholecystitis cannot definitely be excluded. Recommend clinical correlation, and consider nuclear medicine hepatobiliary scan if warranted clinically. Diffuse biliary ductal dilatation, with abrupt stricture of the distal common bile duct.  No definite mass or choledocholithiasis identified. Consider ERCP for further evaluation. Hepatic cirrhosis.  No evidence of hepatic neoplasm. Mild splenomegaly and minimal ascites, consistent with portal venous hypertension. Electronically Signed   By: Myles RosenthalJohn  Stahl M.D.   On: 02/05/2017 08:43    Assessment: 56 year old female presenting with malodorous urine, lower abdominal discomfort, mild dysuria. Urinalysis positive, culture pending. CT with distended gallbladder with mild gallbladder wall thickening, dilation of the CBD 1.7 cm. At least one stone noted in the gallbladder. AST minimally elevated and otherwise LFTs are normal. MRI performed with cholelithiasis, hepatic cirrhosis, gallbladder is distended with mild pericholecystic fluid but no gallbladder wall thickening. Findings could be secondary to hepatic cirrhosis. She had diffuse biliary ductal dilation with the CBD is 16 mm, abrupt stricture of the distal common bile duct seen but no choledocholithiasis.  It was unclear if her current symptoms are related to gallbladder distention and biliary dilation. She has minimal abdominal tenderness in the right upper quadrant initially. No nausea or vomiting or difficulty eating. LFTs unremarkable. Given her significant thrombocytopenia, advised against ERCP locally as she is high risk for bleeding with ERCP interventions such as sphincterotomy. She may be best served with EUS to determine etiology of distal CBD stricture prior to consideration of ERCP in this setting and both can be done at [facility].   New diagnosis of cirrhosis, chronic hep C dating back to 2001, significant thrombocytopenia. Cannot rule out intermittent hepatic encephalopathy based on her history. Ultimately she will need EGD and a colonoscopy. We will plan on referring [facility] [place] for management of her hepatitis C given decompensated cirrhosis. [facility] for EUS/ERCP. Hep A & B negative.  Surgery saw the patient and is not a surgical candidate at this time due to comorbidities and less likely cholecystitis as an etiology of her symptoms.  Today she seems poorly educated about her condition. She thinks she's in kidney failure and thinks cirrhosis is liver cancer. I educated her about cirrosis, likely etiology in her case, reassured her about her kidney function. Discussed need for referral to Liver Clinic in [place] for HCV  treatment.   MELD: 9; Child-Pugh: B  Plan: 1. Will need referral to Liver Clinic in [place] for HCV treatment 2. Will need Hep A & B vaccination 3. EGD for variceal screening and colonoscopy as outpatient 4. EUS/ERCP at tertiary referral center [facility]) 5. Continue lactulose at discharge   Thank you for allowing [place] to participate in the care of Wilburt Finlay, DNP, AGNP-C Adult & Gerontological Nurse Practitioner [facility]     LOS: 0 days    02/06/2017, 10:07 AM

## 2017-02-12 ENCOUNTER — Encounter: Payer: Self-pay | Admitting: Gastroenterology

## 2017-02-12 ENCOUNTER — Telehealth: Payer: Self-pay | Admitting: Gastroenterology

## 2017-02-12 NOTE — Telephone Encounter (Signed)
1. Will need referral to [place] for [place] treatment 2. Will need Hep A & B vaccination 3. EGD for variceal screening and colonoscopy as outpatient 4. EUS/ERCP at tertiary referral center [facility] PER SF    I have scheduled OV for 03/09/2017 at 9 am with LSL and mailed letter.

## 2017-02-16 ENCOUNTER — Other Ambulatory Visit: Payer: Self-pay

## 2017-02-16 DIAGNOSIS — K831 Obstruction of bile duct: Secondary | ICD-10-CM

## 2017-02-16 DIAGNOSIS — B1920 Unspecified viral hepatitis C without hepatic coma: Secondary | ICD-10-CM

## 2017-02-16 NOTE — Telephone Encounter (Signed)
Referral info/[facility].  EG advised to proceed with referral to liver clinic and [facility]. He also advised to wait for EGD/TCS and Hep A & B vaccination until pt has OV.

## 2017-02-16 NOTE — Telephone Encounter (Signed)
Referral info faxed to [facility] [place].

## 2017-02-17 NOTE — Telephone Encounter (Signed)
LMOVM and informed pt of referrals sent to liver clinic and [facility]. Also informed of OV appt with LSL 03/09/17 at 9:00am.

## 2017-03-02 NOTE — Telephone Encounter (Signed)
Received fax from [facility]. Pt has appt 03/11/17 at 2:15pm.

## 2017-03-02 NOTE — Telephone Encounter (Signed)
Called [facility]. Pt is scheduled for procedure 03/12/17 at 9:00am.

## 2017-03-09 ENCOUNTER — Ambulatory Visit: Payer: 59 | Admitting: Gastroenterology

## 2017-03-14 ENCOUNTER — Inpatient Hospital Stay
Admit: 2017-03-14 | Discharge: 2017-03-14 | Disposition: A | Discharge status: Home / Self Care | Payer: MEDICARE | Attending: Emergency Medicine

## 2017-03-14 ENCOUNTER — Emergency Department: Admit: 2017-03-14 | Payer: MEDICARE | Primary: Internal Medicine

## 2017-03-14 DIAGNOSIS — K2970 Gastritis, unspecified, without bleeding: Secondary | ICD-10-CM | POA: Diagnosis not present

## 2017-03-14 DIAGNOSIS — R110 Nausea: Secondary | ICD-10-CM | POA: Diagnosis not present

## 2017-03-14 DIAGNOSIS — R112 Nausea with vomiting, unspecified: Secondary | ICD-10-CM

## 2017-03-14 LAB — LIPASE: Lipase: 173 U/L (ref 73–393)

## 2017-03-14 LAB — METABOLIC PANEL, COMPREHENSIVE
A-G Ratio: 0.6 — ABNORMAL LOW (ref 1.2–3.5)
ALT (SGPT): 22 U/L (ref 12–65)
AST (SGOT): 69 U/L — ABNORMAL HIGH (ref 15–37)
Albumin: 3.1 g/dL — ABNORMAL LOW (ref 3.5–5.0)
Alk. phosphatase: 156 U/L — ABNORMAL HIGH (ref 50–136)
Anion gap: 9 mmol/L (ref 7–16)
BUN: 6 MG/DL (ref 6–23)
Bilirubin, total: 1.1 MG/DL (ref 0.2–1.1)
CO2: 27 mmol/L (ref 21–32)
Calcium: 9 MG/DL (ref 8.3–10.4)
Chloride: 103 mmol/L (ref 98–107)
Creatinine: 0.84 MG/DL (ref 0.6–1.0)
GFR est AA: >60 mL/min/{1.73_m2} (ref >60)
GFR est non-AA: >60 mL/min/{1.73_m2} (ref >60)
Globulin: 5 g/dL — ABNORMAL HIGH (ref 2.3–3.5)
Glucose: 94 mg/dL (ref 65–100)
Potassium: 3 mmol/L — ABNORMAL LOW (ref 3.5–5.1)
Protein, total: 8.1 g/dL (ref 6.3–8.2)
Sodium: 139 mmol/L (ref 136–145)

## 2017-03-14 LAB — CBC WITH AUTOMATED DIFF
ABS. BASOPHILS: 0 10*3/uL (ref 0.0–0.2)
ABS. EOSINOPHILS: 0.1 10*3/uL (ref 0.0–0.8)
ABS. IMM. GRANS.: 0 10*3/uL (ref 0.0–0.5)
ABS. LYMPHOCYTES: 2.5 10*3/uL (ref 0.5–4.6)
ABS. MONOCYTES: 0.4 10*3/uL (ref 0.1–1.3)
ABS. NEUTROPHILS: 4.5 10*3/uL (ref 1.7–8.2)
BASOPHILS: 1 % (ref 0.0–2.0)
EOSINOPHILS: 1 % (ref 0.5–7.8)
HCT: 39.7 % (ref 35.8–46.3)
HGB: 13.3 g/dL (ref 11.7–15.4)
IMMATURE GRANULOCYTES: 0 % (ref 0.0–5.0)
LYMPHOCYTES: 33 % (ref 13–44)
MCH: 28.8 PG (ref 26.1–32.9)
MCHC: 33.5 g/dL (ref 31.4–35.0)
MCV: 85.9 FL (ref 79.6–97.8)
MONOCYTES: 5 % (ref 4.0–12.0)
MPV: 10.2 FL — ABNORMAL LOW (ref 10.8–14.1)
NEUTROPHILS: 60 % (ref 43–78)
PLATELET: 149 10*3/uL — ABNORMAL LOW (ref 150–450)
RBC: 4.62 M/uL (ref 4.05–5.25)
RDW: 15.5 % — ABNORMAL HIGH (ref 11.9–14.6)
WBC: 7.6 10*3/uL (ref 4.3–11.1)

## 2017-03-14 LAB — URINE MICROSCOPIC
Casts: 0 /lpf
Crystals, urine: 0 /LPF
Mucus: 0 /lpf

## 2017-03-14 LAB — AMMONIA: Ammonia, plasma: 52 umol/L — ABNORMAL HIGH (ref 11–32)

## 2017-03-14 MED ORDER — FAMOTIDINE (PF) 20 MG/2 ML IV
20 mg/2 mL | INTRAVENOUS | Status: AC
Start: 2017-03-14 — End: 2017-03-14
  Administered 2017-03-14: 17:00:00 via INTRAVENOUS

## 2017-03-14 MED ORDER — PROMETHAZINE 25 MG RECTAL SUPPOSITORY
25 mg | Freq: Four times a day (QID) | RECTAL | 0 refills | Status: DC | PRN
Start: 2017-03-14 — End: 2017-11-24

## 2017-03-14 MED ORDER — LACTULOSE 10 GRAM/15 ML ORAL SOLUTION
10 gram/15 mL | ORAL | Status: AC
Start: 2017-03-14 — End: 2017-03-14
  Administered 2017-03-14: 18:00:00 via ORAL

## 2017-03-14 MED ORDER — SALINE PERIPHERAL FLUSH PRN
Freq: Once | INTRAMUSCULAR | Status: AC
Start: 2017-03-14 — End: 2017-03-14
  Administered 2017-03-14: 21:00:00

## 2017-03-14 MED ORDER — IOPAMIDOL 76 % IV SOLN
370 mg iodine /mL (76 %) | Freq: Once | INTRAVENOUS | Status: AC
Start: 2017-03-14 — End: 2017-03-14
  Administered 2017-03-14: 21:00:00 via INTRAVENOUS

## 2017-03-14 MED ORDER — PROMETHAZINE 25 MG TAB
25 mg | ORAL_TABLET | Freq: Four times a day (QID) | ORAL | 0 refills | Status: DC | PRN
Start: 2017-03-14 — End: 2017-11-24

## 2017-03-14 MED ORDER — HYDROMORPHONE (PF) 2 MG/ML IJ SOLN
2 mg/mL | INTRAMUSCULAR | Status: AC
Start: 2017-03-14 — End: 2017-03-14
  Administered 2017-03-14: 17:00:00 via INTRAVENOUS

## 2017-03-14 MED ORDER — SODIUM CHLORIDE 0.9% BOLUS IV
0.9 % | Freq: Once | INTRAVENOUS | Status: AC
Start: 2017-03-14 — End: 2017-03-14
  Administered 2017-03-14: 21:00:00 via INTRAVENOUS

## 2017-03-14 MED ORDER — SODIUM CHLORIDE 0.9% BOLUS IV
0.9 % | Freq: Once | INTRAVENOUS | Status: AC
Start: 2017-03-14 — End: 2017-03-14
  Administered 2017-03-14: 17:00:00 via INTRAVENOUS

## 2017-03-14 MED ORDER — ONDANSETRON 8 MG TAB, RAPID DISSOLVE
8 mg | ORAL_TABLET | Freq: Three times a day (TID) | ORAL | 1 refills | Status: DC | PRN
Start: 2017-03-14 — End: 2017-11-24

## 2017-03-14 MED ORDER — HYOSCYAMINE 0.125 MG SUBLINGUAL TAB
0.125 mg | ORAL_TABLET | Freq: Four times a day (QID) | SUBLINGUAL | 0 refills | Status: DC | PRN
Start: 2017-03-14 — End: 2017-11-24

## 2017-03-14 MED ORDER — ONDANSETRON (PF) 4 MG/2 ML INJECTION
4 mg/2 mL | INTRAMUSCULAR | Status: AC
Start: 2017-03-14 — End: 2017-03-14
  Administered 2017-03-14: 17:00:00 via INTRAVENOUS

## 2017-03-14 MED FILL — HYDROMORPHONE (PF) 2 MG/ML IJ SOLN: 2 mg/mL | INTRAMUSCULAR | Qty: 1

## 2017-03-14 MED FILL — LACTULOSE 20 GRAM/30 ML ORAL SOLUTION: 20 gram/30 mL | ORAL | Qty: 45

## 2017-03-14 MED FILL — ONDANSETRON (PF) 4 MG/2 ML INJECTION: 4 mg/2 mL | INTRAMUSCULAR | Qty: 2

## 2017-03-14 MED FILL — FAMOTIDINE (PF) 20 MG/2 ML IV: 20 mg/2 mL | INTRAVENOUS | Qty: 2

## 2017-03-14 NOTE — ED Triage Notes (Signed)
Pt states having lower right abdominal pain for over a month.

## 2017-03-14 NOTE — ED Provider Notes (Signed)
HPI Comments: Patient with acute on chronic abdominal pain which seemed to start in mid February, patient was seen, evaluated, admitted at [facility] initially much focus was given on possible renal etiologies such as pyelonephritis or kidney stone, eventually things declared themselves and she was found to have cirrhosis with elevated ammonia    More acutely she seems to have a new worsening right sided lower abdominal pain for the last 3 days.  Patient relates nausea and vomiting states she's been able to keep her lactulose or other medicines down orally, patient reportedly had no antiemetics at home to try.    Patient is a 56 y.o. female presenting with abdominal pain. The history is provided by the patient. History limited by: Challenging historian to speak mostly in sentence fragments often answering questions that were not asked.   Abdominal Pain    This is a new problem. The problem occurs constantly. The problem has been rapidly worsening. The pain is associated with vomiting. The pain is located in the RLQ and RUQ. The quality of the pain is aching. The pain is moderate. Associated symptoms include nausea and vomiting. Pertinent negatives include no fever, no diarrhea, no constipation, no dysuria, no frequency, no headaches, no arthralgias, no chest pain and no back pain. Nothing worsens the pain. The pain is relieved by nothing. Past workup includes CT scan, ultrasound, esophagogastroduodenoscopy.        Past Medical History:   Diagnosis Date   ??? COPD    ??? Hypertension    ??? Psychiatric disorder     depression, anxiety       Past Surgical History:   Procedure Laterality Date   ??? HX GYN      BTL   ??? HX ORTHOPAEDIC     ??? NEUROLOGICAL PROCEDURE UNLISTED      nerve in left arm surgery 2009         No family history on file.    Social History     Social History   ??? Marital status: SINGLE     Spouse name: N/A   ??? Number of children: N/A   ??? Years of education: N/A     Occupational History   ??? Not on file.      Social History Main Topics   ??? Smoking status: Current Every Day Smoker     Packs/day: 0.50   ??? Smokeless tobacco: Not on file   ??? Alcohol use No   ??? Drug use: No   ??? Sexual activity: Yes     Other Topics Concern   ??? Not on file     Social History Narrative         ALLERGIES: Neurontin [gabapentin]    Review of Systems   Constitutional: Negative for chills and fever.   HENT: Negative for rhinorrhea and sore throat.    Eyes: Negative for discharge and redness.   Respiratory: Negative for cough and shortness of breath.    Cardiovascular: Negative for chest pain and palpitations.   Gastrointestinal: Positive for abdominal pain, nausea and vomiting. Negative for constipation and diarrhea.   Genitourinary: Negative for dysuria and frequency.   Musculoskeletal: Negative for arthralgias and back pain.   Skin: Negative for rash.   Neurological: Negative for dizziness and headaches.   Psychiatric/Behavioral: Positive for confusion (mild, according to her fiancee).   All other systems reviewed and are negative.      Vitals:    03/14/17 1153 03/14/17 1211   BP: 121/75  Pulse: 100    Resp: 16    Temp: 98.4 ??F (36.9 ??C)    SpO2: 97% 97%   Weight: 72.6 kg (160 lb)    Height:  (1.651 m)             Physical Exam   Constitutional: She is oriented to person, place, and time. She appears well-developed and well-nourished. No distress.   HENT:   Head: Normocephalic and atraumatic.   Eyes: Conjunctivae are normal. Pupils are equal, round, and reactive to light. Right eye exhibits no discharge. Left eye exhibits no discharge. No scleral icterus.   Neck: Normal range of motion. Neck supple.   Cardiovascular: Normal rate, regular rhythm and normal heart sounds.  Exam reveals no gallop.    No murmur heard.  Pulmonary/Chest: Effort normal and breath sounds normal. No respiratory distress. She has no wheezes. She has no rales.   Abdominal: Soft. Bowel sounds are normal. There is tenderness in the right  upper quadrant and right lower quadrant. There is no guarding.   Musculoskeletal: Normal range of motion. She exhibits no edema.   Neurological: She is alert and oriented to person, place, and time. She exhibits normal muscle tone.   cni 2-12 grossly   Skin: Skin is warm and dry. She is not diaphoretic.   Psychiatric: She has a normal mood and affect. Her behavior is normal.   Nursing note and vitals reviewed.       MDM  Number of Diagnoses or Management Options  Abdominal pain, generalized:   Non-intractable vomiting with nausea, unspecified vomiting type:   Diagnosis management comments: Medical decision making note:  abd pain, acute on chronic  Labs ok other than ammonia up a little bit (can't keep her lactulose)  Tolerating po now,  Anticipate discharge with a variety of anti-emetics.  Will ct abd since she is now makng the abd pain seem more acute than chronic  This concludes the "medical decision making note" part of this emergency department visit note.          ED Course       Procedures

## 2017-03-14 NOTE — ED Notes (Signed)
I have reviewed discharge instructions with the patient.  The patient verbalized understanding.    Patient left ED via Discharge Method: ambulatory to Home with husband.  Opportunity for questions and clarification provided.       Patient given 4 scripts.         To continue your aftercare when you leave the hospital, you may receive an automated call from our care team to check in on how you are doing.  This is a free service and part of our promise to provide the best care and service to meet your aftercare needs.??? If you have questions, or wish to unsubscribe from this service please call [phone].  Thank you for Choosing our [facility].

## 2017-03-16 ENCOUNTER — Encounter: Attending: Family Medicine | Primary: Internal Medicine

## 2017-03-25 ENCOUNTER — Encounter (HOSPITAL_COMMUNITY): Payer: Medicare HMO | Attending: Oncology

## 2017-04-14 ENCOUNTER — Other Ambulatory Visit: Payer: Self-pay

## 2017-04-14 ENCOUNTER — Encounter: Payer: Self-pay | Admitting: Gastroenterology

## 2017-04-14 ENCOUNTER — Ambulatory Visit (INDEPENDENT_AMBULATORY_CARE_PROVIDER_SITE_OTHER): Payer: Medicare HMO | Admitting: Gastroenterology

## 2017-04-14 VITALS — BP 126/72 | HR 95 | Temp 98.0°F | Ht 65.0 in | Wt 162.0 lb

## 2017-04-14 DIAGNOSIS — B182 Chronic viral hepatitis C: Secondary | ICD-10-CM | POA: Diagnosis not present

## 2017-04-14 DIAGNOSIS — R1011 Right upper quadrant pain: Secondary | ICD-10-CM

## 2017-04-14 DIAGNOSIS — K7460 Unspecified cirrhosis of liver: Principal | ICD-10-CM

## 2017-04-14 MED ORDER — RIFAXIMIN 550 MG PO TABS: 550.0000 mg | ORAL_TABLET | Freq: Two times a day (BID) | ORAL | 3 refills | Status: AC

## 2017-04-14 NOTE — Progress Notes (Signed)
Primary Care Physician:  Ernestine Conrad, MD  Primary Gastroenterologist:  Roetta Sessions, MD   Chief Complaint  Patient presents with  . Hepatitis C  . Cirrhosis    hosp f/u  . Abdominal Pain    ruq    HPI:  Cassandra Montes is a 56 y.o. female here for hospital follow-up. She was seen back in March for a dilated CBD on CT scan. Newly diagnosed cirrhosis noted on CT. Recently moved to the area from [facility]. History of recurrent UTIs. Presented to the hospital with lower abdominal pain and malodorous urine. In the ED she had LFTs which were normal essentially except for mildly elevated AST of 49. Platelet count of 88,000. Reported history of chronic hep C diagnosed in 2001. She had significant other with history of hepatitis C for over 25 years. 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 They have subsequently passed.  Labs indicate positive hepatitis C antibody, genotype 1A, HCV RNA 1,610,960 back in December 2017.  MRI abdomen during hospitalization showed cholelithiasis, hepatic cirrhosis, distended gallbladder with mild pericholecystic fluid but no gallbladder wall thickening. Acute cholecystitis cannot be excluded. She had diffuse biliary ductal dilation with CBD of 16 mm, abrupt stricture of the distal common bile duct seen but no choledocholithiasis. Given significant thrombocytopenia was advised that her ERCP should be done at a tertiary care center and may require EUS as well. Surgery consulted and recommended tertiary care center as well.   She was scheduled to be seen at [facility] 03/11/17 regarding chronic HCV (given significant cirrhosis). She had appt at [facility] for EUS/ERCP 03/12/17. Unfortunately patient decided to go back to [facility] to try to continue care with GI provider that took care of her brothers. She had trouble getting an appointment. In the interim she has had evaluation for ascites/abd pain at [facility] (02/20/17),  patient states they didn't find anything. Went to ED at [facility] 02/21/17 had LVAP (2.5L). Reviewed those records, initial cell count without evidence of SBP. Cultures grew ?contaminant, coag negative staph. Given no clinical concern for SBP she was not treated. She was seen a [place] facility on 03/14/2017, CT scan showed cirrhosis, splenomegaly, no ascites. Small gallstone noted. Biliary tree not dilated on that study. She was also seen with mild hepatic encephalopathy in [facility] and lactulose was increased, this was 2 weeks ago. CT scan at that time with contrast showed cirrhotic liver, splenomegaly, paraesophageal varices.  Currently taking Lactulose 30cc bid. Not working well, no BM in 2-3 days. Complains of fatigue. Continues to have right upper quadrant pain which is constant, not necessarily related to meals. No vomiting. Appetite has been okay although significant other states that she doesn't eat healthy food. No melena rectal bleeding. He wondered about using Xifaxan.  Patient used to use aspirin powders frequently but none in the past month. She was concerned about ongoing bruising. Denies spontaneous bleeding.     Current Outpatient Prescriptions  Medication Sig Dispense Refill  . ADVAIR DISKUS 250-50 MCG/DOSE AEPB Inhale 1 puff into the lungs 2 (two) times daily.     Marland Kitchen ALPRAZolam (XANAX) 0.5 MG tablet Take 0.5 mg by mouth 2 (two) times daily.     Marland Kitchen amLODipine (NORVASC) 5 MG tablet Take 5 mg by mouth daily.     . Aspirin-Acetaminophen-Caffeine (HEADACHE FORMULA PO) Take 1-2 tablets by mouth once as needed (for headache pain).    . hydrochlorothiazide (MICROZIDE) 12.5 MG capsule Take 12.5  mg by mouth daily.     Marland Kitchen lactulose (CHRONULAC) 10 GM/15ML solution Take 15 mLs (10 g total) by mouth 2 (two) times daily. (Patient taking differently: Take 30 g by mouth 2 (two) times daily. ) 240 mL 0  . sertraline (ZOLOFT) 50 MG tablet Take 50 mg by mouth daily.     . traMADol (ULTRAM) 50 MG  tablet Take 50 mg by mouth every 6 (six) hours.  0  . VENTOLIN HFA 108 (90 Base) MCG/ACT inhaler Inhale 1-2 puffs into the lungs every 6 (six) hours as needed for wheezing or shortness of breath.      No current facility-administered medications for this visit.     Allergies as of 04/14/2017 - Review Complete 04/14/2017  Allergen Reaction Noted  . Neurontin [gabapentin] Hives 01/05/2015    Past Medical History:  Diagnosis Date  . Anemia   . Chronic back pain   . Cirrhosis (HCC)   . COPD (chronic obstructive pulmonary disease) (HCC)   . Depression   . Hepatitis C, chronic (HCC)   . Sciatica   . Thrombocytopenia (HCC) 09/2016   due to chronic hepatitis C and splenomegaly    Past Surgical History:  Procedure Laterality Date  . BACK SURGERY    . TUBAL LIGATION      Family History  Problem Relation Age of Onset  . Liver disease Mother     Enlarged  . Cirrhosis Brother     Alcohol and hepatitis C  . Cirrhosis Brother     Alcohol and hepatitis C, received liver transplant    Social History   Social History  . Marital status: Widowed    Spouse name: N/A  . Number of children: N/A  . Years of education: N/A   Occupational History  . Not on file.   Social History Main Topics  . Smoking status: Current Every Day Smoker    Packs/day: 1.00    Types: Cigarettes  . Smokeless tobacco: Never Used  . Alcohol use No  . Drug use: No  . Sexual activity: Yes    Birth control/ protection: Post-menopausal   Other Topics Concern  . Not on file   Social History Narrative  . No narrative on file      ROS:  General: Negative for anorexia, weight loss, fever, chills, fatigue, weakness. Eyes: Negative for vision changes.  ENT: Negative for hoarseness, difficulty swallowing , nasal congestion. CV: Negative for chest pain, angina, palpitations, dyspnea on exertion, peripheral edema.  Respiratory: Negative for dyspnea at rest, dyspnea on exertion, cough, sputum, wheezing.   GI: See history of present illness. GU:  Negative for dysuria, hematuria, urinary incontinence, urinary frequency, nocturnal urination.  MS: Negative for joint pain, Chronic low back pain.  Derm: Negative for rash or itching.  Neuro: Negative for weakness, abnormal sensation, seizure, frequent headaches, memory loss, confusion.  Psych: Negative for anxiety, depression, suicidal ideation, hallucinations.  Endo: Negative for unusual weight change.  Heme: Negative for bruising or bleeding. Allergy: Negative for rash or hives.    Physical Examination:  BP 126/72   Pulse 95   Temp 98 F (36.7 C) (Oral)   Ht 5\' 5"  (1.651 m)   Wt 162 lb (73.5 kg)   BMI 26.96 kg/m    General: Well-nourished, well-developed in no acute distress. Accompanied by significant other Head: Normocephalic, atraumatic.   Eyes: Conjunctiva pink, no icterus. Mouth: Oropharyngeal mucosa moist and pink , no lesions erythema or exudate. Neck: Supple without thyromegaly, masses,  or lymphadenopathy.  Lungs: Clear to auscultation bilaterally.  Heart: Regular rate and rhythm, no murmurs rubs or gallops.  Abdomen: Bowel sounds are normal, mild right upper quadrant tenderness, nondistended, no hepatomegaly, do not appreciate splenomegaly or masses, no abdominal bruits or    hernia , no rebound or guarding.   Rectal: Not performed Extremities: No lower extremity edema. No clubbing or deformities.  Neuro: Alert and oriented x 4 , grossly normal neurologically.  Skin: Warm and dry, no rash or jaundice.   Psych: Alert and cooperative, normal mood and affect.  Labs: Labs from 03/[facility]/2017, [facility]. Albumin 3, alkaline phosphatase 138, AST 49, ALT 12. Total bilirubin 1. Ammonia 37. Lipase 80. White blood cell count 6700, hemoglobin 13.6, MCV 87, platelets 115,000. INR 1.4.  Imaging Studies: No results found.

## 2017-04-14 NOTE — Patient Instructions (Addendum)
1. Please make sure you update us with any address changes in the future.  2. I will follow up on pending test results from Thursday [facility]. 3. Please address concerns for pain management with your PCP.  4. MELD score based on 03/31/17 labs was 10.  5. I will [facility] for hepatitis C. 6. You can take up to 30cc of lactulose four times daily to have 2-3 soft bowel movements daily.  7. Add Xifaxan 550mg  twice daily as discussed.  8. We will see you back in the office in 6 weeks for follow up.

## 2017-04-15 ENCOUNTER — Telehealth: Payer: Self-pay

## 2017-04-15 NOTE — Assessment & Plan Note (Signed)
56 year old female seen during recent hospitalization when she was incidentally noted have cirrhosis in the setting of chronic hepatitis C which has not been treated. Also noted to have dilated common bile duct, cholelithiasis, distended gallbladder with mild pericholecystic fluid but no gallbladder wall thickening. Her CBD was 16 mm with abrupt stricture of the distal common duct seen on MRCP but no choledocholithiasis. Unfortunately she has sought medical attention at multiple facilities and missed [facility] as well as [facility]. She does have scheduled procedure,? EUS/ERCP on 04/16/2017. We will await those findings. She still needs to have an upper endoscopy at some point as I do not anticipate this being done with upcoming procedure this week. Likely has esophageal varices based on imaging. In addition consideration to a colonoscopy she's never had one discussed. She also needs to have another referral to [facility] for possible treatment of HCV given her significant cirrhosis we typically don't treat locally. Explained to the patient that she needs to discuss pain management with her PCP as she has chronic back issues and likely would benefit from chronic pain management facility. Hopefully her abdominal pain specifically right upper quadrant pain will improve with upcoming procedure. Further recommendations to follow.

## 2017-04-15 NOTE — Progress Notes (Signed)
cc'ed to pcp °

## 2017-04-15 NOTE — Telephone Encounter (Signed)
Referral [facility] to [facility].

## 2017-04-29 ENCOUNTER — Encounter: Payer: Self-pay | Admitting: Family Medicine

## 2017-04-29 ENCOUNTER — Ambulatory Visit (INDEPENDENT_AMBULATORY_CARE_PROVIDER_SITE_OTHER): Payer: Medicare HMO | Admitting: Family Medicine

## 2017-04-29 DIAGNOSIS — F1320 Sedative, hypnotic or anxiolytic dependence, uncomplicated: Secondary | ICD-10-CM

## 2017-04-29 NOTE — Progress Notes (Signed)
Patient is here to consult about moving her family practice care. We discussed what this office will / will not provide for her. I asked her if she felt she could wean off of her Xanax, or is she is willing to see a psychiatrist to help manage her anxiety. She replied that she does not feel she can live without her xanax, and does not want to change her current dose. I explained benzodiazepine dependence and that there are better and safer treatments available for her.  She declnes intervention. She is on tramadol for chronic pain.  I advised her that I would not continue this.  This medicine is not indicated for people with significant liver failure/cirrhosis.  She wants her PCP to offer pain management.  We mutually decided that this is not the practice she wishes to establish with.  She is given the name of other PCP providers

## 2017-04-29 NOTE — Patient Instructions (Signed)
You need to find a primary care doctor that can provide pain management  I suggest you try the [facility]

## 2017-05-05 ENCOUNTER — Telehealth: Payer: Self-pay | Admitting: Gastroenterology

## 2017-05-05 ENCOUNTER — Emergency Department (HOSPITAL_COMMUNITY)
Admission: EM | Admit: 2017-05-05 | Discharge: 2017-05-05 | Disposition: A | Discharge status: Home / Self Care | Payer: Medicare HMO | Attending: Emergency Medicine | Admitting: Emergency Medicine

## 2017-05-05 ENCOUNTER — Encounter (HOSPITAL_COMMUNITY): Payer: Self-pay | Admitting: Emergency Medicine

## 2017-05-05 DIAGNOSIS — F17210 Nicotine dependence, cigarettes, uncomplicated: Secondary | ICD-10-CM | POA: Diagnosis not present

## 2017-05-05 DIAGNOSIS — J449 Chronic obstructive pulmonary disease, unspecified: Secondary | ICD-10-CM | POA: Insufficient documentation

## 2017-05-05 DIAGNOSIS — J45909 Unspecified asthma, uncomplicated: Secondary | ICD-10-CM | POA: Diagnosis not present

## 2017-05-05 DIAGNOSIS — Z79899 Other long term (current) drug therapy: Secondary | ICD-10-CM | POA: Insufficient documentation

## 2017-05-05 DIAGNOSIS — Z7982 Long term (current) use of aspirin: Secondary | ICD-10-CM | POA: Insufficient documentation

## 2017-05-05 DIAGNOSIS — R1011 Right upper quadrant pain: Secondary | ICD-10-CM | POA: Insufficient documentation

## 2017-05-05 DIAGNOSIS — I10 Essential (primary) hypertension: Secondary | ICD-10-CM | POA: Diagnosis not present

## 2017-05-05 DIAGNOSIS — R109 Unspecified abdominal pain: Secondary | ICD-10-CM

## 2017-05-05 LAB — COMPREHENSIVE METABOLIC PANEL
ALT: 22 U/L (ref 14–54)
AST: 66 U/L — ABNORMAL HIGH (ref 15–41)
Albumin: 3.3 g/dL — ABNORMAL LOW (ref 3.5–5.0)
Alkaline Phosphatase: 124 U/L (ref 38–126)
Anion gap: 11 (ref 5–15)
BUN: 10 mg/dL (ref 6–20)
CO2: 23 mmol/L (ref 22–32)
Calcium: 9.2 mg/dL (ref 8.9–10.3)
Chloride: 104 mmol/L (ref 101–111)
Creatinine, Ser: 0.79 mg/dL (ref 0.44–1.00)
GFR calc Af Amer: >60 mL/min (ref >60)
GFR calc non Af Amer: >60 mL/min (ref >60)
Glucose, Bld: 100 mg/dL — ABNORMAL HIGH (ref 65–99)
Potassium: 3.5 mmol/L (ref 3.5–5.1)
Sodium: 138 mmol/L (ref 135–145)
Total Bilirubin: 0.6 mg/dL (ref 0.3–1.2)
Total Protein: 7.9 g/dL (ref 6.5–8.1)

## 2017-05-05 LAB — URINALYSIS, ROUTINE W REFLEX MICROSCOPIC
Bilirubin Urine: NEGATIVE
Glucose, UA: NEGATIVE mg/dL
Hgb urine dipstick: NEGATIVE
Ketones, ur: NEGATIVE mg/dL
Nitrite: NEGATIVE
Protein, ur: NEGATIVE mg/dL
Specific Gravity, Urine: 1.024 (ref 1.005–1.030)
pH: 6 (ref 5.0–8.0)

## 2017-05-05 LAB — CBC WITH DIFFERENTIAL/PLATELET
Basophils Absolute: 0 10*3/uL (ref 0.0–0.1)
Basophils Relative: 0 %
Eosinophils Absolute: 0.1 10*3/uL (ref 0.0–0.7)
Eosinophils Relative: 1 %
HCT: 41.2 % (ref 36.0–46.0)
Hemoglobin: 13.5 g/dL (ref 12.0–15.0)
Lymphocytes Relative: 34 %
Lymphs Abs: 2.2 10*3/uL (ref 0.7–4.0)
MCH: 29.1 pg (ref 26.0–34.0)
MCHC: 32.8 g/dL (ref 30.0–36.0)
MCV: 88.8 fL (ref 78.0–100.0)
Monocytes Absolute: 0.3 10*3/uL (ref 0.1–1.0)
Monocytes Relative: 5 %
Neutro Abs: 3.9 10*3/uL (ref 1.7–7.7)
Neutrophils Relative %: 60 %
Platelets: 119 10*3/uL — ABNORMAL LOW (ref 150–400)
RBC: 4.64 MIL/uL (ref 3.87–5.11)
RDW: 15.9 % — ABNORMAL HIGH (ref 11.5–15.5)
WBC: 6.5 10*3/uL (ref 4.0–10.5)

## 2017-05-05 LAB — LIPASE, BLOOD: Lipase: 37 U/L (ref 11–51)

## 2017-05-05 MED ORDER — SODIUM CHLORIDE 0.9 % IV BOLUS (SEPSIS)
500.0000 mL | Freq: Once | INTRAVENOUS | Status: AC
  Administered 2017-05-05: 500 mL via INTRAVENOUS

## 2017-05-05 MED ORDER — PROMETHAZINE HCL 25 MG/ML IJ SOLN
12.5000 mg | Freq: Once | INTRAMUSCULAR | Status: AC
  Administered 2017-05-05: 12.5 mg via INTRAVENOUS
  Filled 2017-05-05: qty 1

## 2017-05-05 MED ORDER — MORPHINE SULFATE (PF) 4 MG/ML IV SOLN
6.0000 mg | Freq: Once | INTRAVENOUS | Status: AC
  Administered 2017-05-05: 6 mg via INTRAVENOUS
  Filled 2017-05-05: qty 2

## 2017-05-05 NOTE — Telephone Encounter (Signed)
Pt called back and said that she was going to cancel the appointment for tomorrow and see how she does for a couple of days

## 2017-05-05 NOTE — ED Triage Notes (Signed)
Pt c/o knot to RUQ x 2 days. Painful. Worse with movement. Nausea. Denies v/d.

## 2017-05-05 NOTE — Discharge Instructions (Signed)
Follow-up with your gastroenterologist.  

## 2017-05-05 NOTE — Telephone Encounter (Signed)
I called and spoke to pt and she said she thinks she has a hernia on her right side. She said she no longer has a PCP since Dr. Pauletta BrownsBluth's death. I told her the office he is at would be taking care of her in the interim as far as PCP. She wanted her boy friend to talk to me, said she gets so nervous talking. He spoke to me Normand Sloop( Don Davis). Said pt is upset since PCP's death and she can't think straight when trying to discuss her concerns. Said she is very upset with having cirrhosis. She could not take the Xifaxan, said she took it for about a week, but the side effects of anger and acting crazy, she stopped after about a week. She had been taking lactulose from PCP.  They are wanting to follow up with Tana CoastLeslie Lewis, PA,before the appt at the end of June.  Verlon AuLeslie, Please advise!  ( Also, see note by Dr. Delton SeeNelson).

## 2017-05-05 NOTE — Telephone Encounter (Signed)
Agree, patient should still be able to receive care at [facility].   [facility]. Records to be scanned. EGD was done as well with normal esophagus, no varices seen. She did have portal hypertensive gastropathy. Common bile duct was dilated without stones or strictures and no other abnormality seen. No plans for ERCP. Exam is being done for abrupt stricture of the distal common bile duct seen on previous MRCP in March.  regarding possibility of hernia, none seen on prior MRCP or CT earlier this year. Cannot exclude that she may have developed one since then.  If she is requesting to be seen regarding GI issues, we can try to move her appointment up, first available. However be clear with her that we cannot provide her with pain medication or Xanax.

## 2017-05-05 NOTE — Telephone Encounter (Signed)
See previous note

## 2017-05-05 NOTE — Telephone Encounter (Signed)
PT is aware and would like appt for her right side swelling. Ov with Tana CoastLeslie Lewis, PA on 05/06/2017 at 1:30 pm.  She is aware we do not provide pain meds or the Xanax.

## 2017-05-05 NOTE — Telephone Encounter (Signed)
Pt has OV with [facility] on 6/19 and called this morning saying that she has a hernia and her PCP has died and wanted to know if [facility] could refer her to someone that treats hernias or recommend another pcp or could [facility] see her sooner that 6/19. She also had questions about her medications. Please advise and call [phone]

## 2017-05-05 NOTE — Telephone Encounter (Signed)
Pt was returning a call from DS> Please call her back at [phone]

## 2017-05-05 NOTE — Telephone Encounter (Signed)
Left message to call.

## 2017-05-05 NOTE — ED Provider Notes (Signed)
[facility]-EMERGENCY DEPT Provider Note   CSN: [id number] Arrival date & time: 05/05/17  1545   By signing my name below, I, Cassandra Montes, attest that this documentation has been prepared under the direction and in the presence of Raeford RazorKohut, Oluwadarasimi Favor, MD . Electronically Signed: Freida Busmaniana Montes, Scribe. 05/05/2017. 5:23 PM.  History   Chief Complaint Chief Complaint  Patient presents with  . Abdominal Pain    The history is provided by the patient. No language interpreter was used.    HPI Comments:  Cassandra Montes is a 56 y.o. female with a history of Hep C, cholelithiasis, and cirrhosis , who presents to the Emergency Department complaining of moderate, right sided abdominal pain x a few days. She notices the pain more when standing. She describes a golf ball sized knot to the location and is concerned she may have a hernia. Pt has a h/o tubal ligation, no other abdominal surgeries. She reports associated nausea. No vomiting, diarrhea, or fever. No alleviating factors noted.   Past Medical History:  Diagnosis Date  . Allergy   . Anemia   . Anxiety   . Arthritis   . Asthma   . Chronic back pain   . Cirrhosis (HCC)   . COPD (chronic obstructive pulmonary disease) (HCC)   . Depression   . Hepatitis C, chronic (HCC)   . Hypertension   . Sciatica   . 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 Benzodiazepine dependence (HCC) 04/29/2017  . RUQ pain 04/14/2017  . Cholelithiasis with choledocholithiasis   . Esophageal varices without bleeding (HCC)   . Gastric varices   . Splenomegaly   . Thrombocytopenia (HCC)   . Common bile duct dilation   . Abdominal pain   . Cirrhosis of liver without ascites (HCC)   . Choledocholithiasis 02/04/2017  . UTI (urinary tract infection) 02/04/2017  . Chronic hepatitis C with cirrhosis (HCC) 02/04/2017  . Tobacco abuse 02/04/2017  . COPD (chronic obstructive  pulmonary disease) (HCC) 02/04/2017    Past Surgical History:  Procedure Laterality Date  . BACK SURGERY    . TUBAL LIGATION      OB History    Gravida Para Term Preterm AB Living   1             SAB TAB Ectopic Multiple Live Births                   Home Medications    Prior to Admission medications   Medication Sig Start Date End Date Taking? Authorizing Provider  ADVAIR DISKUS 250-50 MCG/DOSE AEPB Inhale 1 puff into the lungs 2 (two) times daily.  01/14/17   [provider]  ALPRAZolam Prudy Feeler(XANAX) 0.5 MG tablet Take 0.5 mg by mouth 2 (two) times daily.  01/14/17   [provider]  amLODipine (NORVASC) 5 MG tablet Take 5 mg by mouth daily.  01/14/17   [provider]  Aspirin-Acetaminophen-Caffeine (HEADACHE FORMULA PO) Take 1-2 tablets by mouth once as needed (for headache pain).    [provider]  dicyclomine (BENTYL) 10 MG capsule  04/01/17   [provider]  furosemide (LASIX) 20 MG tablet  02/22/17   [provider]  hydrochlorothiazide (MICROZIDE) 12.5 MG capsule Take 12.5 mg by mouth daily.  01/14/17   [provider]  lisinopril-hydrochlorothiazide (PRINZIDE,ZESTORETIC) 20-25 MG tablet Take by mouth.    [provider]  potassium chloride SA (K-DUR,KLOR-CON) 20 MEQ tablet  04/07/17   [provider]  rifaximin (XIFAXAN) 550 MG TABS tablet Take 1 tablet (550 mg total) by mouth 2 (two) times daily. 04/14/17   Tiffany Kocher, PA-C  sertraline (ZOLOFT) 50 MG tablet Take 50 mg by mouth.    [provider]  traMADol (ULTRAM) 50 MG tablet Take 50 mg by mouth every 6 (six) hours. 01/14/17   [provider]  VENTOLIN HFA 108 (90 Base) MCG/ACT inhaler Inhale 1-2 puffs into the lungs every 6 (six) hours as needed for wheezing or shortness of breath.  01/14/17   [provider]    Family History Family History  Problem Relation Age of Onset  . Liver disease Mother        Enlarged  .  Arthritis Mother   . COPD Mother   . Depression Mother   . Diabetes Mother   . Hyperlipidemia Mother   . Hypertension Mother   . Kidney disease Mother   . Cirrhosis Brother        Alcohol and hepatitis C  . Cirrhosis Brother        Alcohol and hepatitis C, received liver transplant  . Early death Father   . Alcohol abuse Father     Social History Social History  Substance Use Topics  . Smoking status: Current Every Day Smoker    Packs/day: 1.00    Types: Cigarettes    Start date: 12/08/1978  . Smokeless tobacco: Never Used  . Alcohol use No     Allergies   Neurontin [gabapentin]   Review of Systems Review of Systems  Constitutional: Negative for fever.  Gastrointestinal: Positive for abdominal pain and nausea. Negative for diarrhea and vomiting.  All other systems reviewed and are negative.    Physical Exam Updated Vital Signs BP (!) 178/86   Pulse 93   Temp 98.4 F (36.9 C) (Oral)   Resp 18   SpO2 99%   Physical Exam  Constitutional: She is oriented to person, place, and time. She appears well-developed and well-nourished. No distress.  HENT:  Head: Normocephalic and atraumatic.  Eyes: EOM are normal.  Neck: Normal range of motion.  Cardiovascular: Normal rate, regular rhythm and normal heart sounds.   Pulmonary/Chest: Effort normal. She has wheezes (occasional expiratory wheeze).  Abdominal: Soft. She exhibits no distension. There is tenderness in the right upper quadrant and epigastric area.  No hernia appreciated Suspect pt is feeling her liver  Musculoskeletal: Normal range of motion.  Neurological: She is alert and oriented to person, place, and time.  Skin: Skin is warm and dry.  Psychiatric: She has a normal mood and affect. Judgment normal.  Nursing note and vitals reviewed.    ED Treatments / Results  DIAGNOSTIC STUDIES:  Oxygen Saturation is 99% on RA, normal by my interpretation.    COORDINATION OF CARE:  5:29 PM Discussed treatment  plan with pt at bedside and pt agreed to plan.  Labs (all labs ordered are listed, but only abnormal results are displayed) Labs Reviewed  COMPREHENSIVE METABOLIC PANEL - Abnormal; Notable for the following:       Result Value   Glucose, Bld 100 (*)    Albumin 3.3 (*)    AST 66 (*)    All other components within normal limits  URINALYSIS, ROUTINE W REFLEX MICROSCOPIC - Abnormal; Notable for the following:    APPearance HAZY (*)    Leukocytes, UA SMALL (*)  Bacteria, UA RARE (*)    Squamous Epithelial / LPF 6-30 (*)    All other components within normal limits  CBC WITH DIFFERENTIAL/PLATELET - Abnormal; Notable for the following:    RDW 15.9 (*)    Platelets 119 (*)    All other components within normal limits  LIPASE, BLOOD    EKG  EKG Interpretation None       Radiology No results found.  Procedures Procedures (including critical care time)  Medications Ordered in ED Medications - No data to display   Initial Impression / Assessment and Plan / ED Course  I have reviewed the triage vital signs and the nursing notes.  Pertinent labs & imaging results that were available during my care of the patient were reviewed by me and considered in my medical decision making (see chart for details).     55yF with abdominal pain. She thoughts she may have a hernia. What she is feeling is actually her liver edge. Exam otherwise pretty unremarkable. Has established GI care.   Final Clinical Impressions(s) / ED Diagnoses   Final diagnoses:  Abdominal pain, unspecified abdominal location    New Prescriptions New Prescriptions   No medications on file   I personally preformed the services scribed in my presence. The recorded information has been reviewed is accurate. Raeford Razor, MD.     Raeford Razor, MD 05/10/17 2056

## 2017-05-06 ENCOUNTER — Ambulatory Visit: Payer: Medicare HMO | Admitting: Gastroenterology

## 2017-05-26 ENCOUNTER — Ambulatory Visit: Payer: Medicare HMO | Admitting: Gastroenterology

## 2017-07-21 ENCOUNTER — Other Ambulatory Visit (HOSPITAL_COMMUNITY): Payer: Self-pay | Admitting: Family Medicine

## 2017-07-21 DIAGNOSIS — R229 Localized swelling, mass and lump, unspecified: Principal | ICD-10-CM

## 2017-07-21 DIAGNOSIS — IMO0002 Reserved for concepts with insufficient information to code with codable children: Secondary | ICD-10-CM

## 2017-07-24 DIAGNOSIS — H0289 Other specified disorders of eyelid: Secondary | ICD-10-CM | POA: Diagnosis not present

## 2017-07-24 DIAGNOSIS — S00211A Abrasion of right eyelid and periocular area, initial encounter: Secondary | ICD-10-CM | POA: Diagnosis not present

## 2017-08-04 ENCOUNTER — Encounter (HOSPITAL_COMMUNITY): Payer: Medicare HMO

## 2017-08-04 ENCOUNTER — Other Ambulatory Visit: Payer: Self-pay | Admitting: Family Medicine

## 2017-08-04 DIAGNOSIS — M539 Dorsopathy, unspecified: Secondary | ICD-10-CM

## 2017-08-04 DIAGNOSIS — G8928 Other chronic postprocedural pain: Secondary | ICD-10-CM

## 2017-08-18 ENCOUNTER — Inpatient Hospital Stay (HOSPITAL_COMMUNITY)
Admission: RE | Admit: 2017-08-18 | Discharge: 2017-08-18 | Disposition: A | Discharge status: Home / Self Care | Payer: Self-pay | Source: Ambulatory Visit | Attending: Family Medicine | Admitting: Family Medicine

## 2017-08-18 ENCOUNTER — Encounter (HOSPITAL_COMMUNITY): Payer: Self-pay

## 2017-08-24 ENCOUNTER — Inpatient Hospital Stay
Admission: RE | Admit: 2017-08-24 | Discharge: 2017-08-24 | Disposition: A | Discharge status: Home / Self Care | Payer: Self-pay | Source: Ambulatory Visit | Attending: Family Medicine | Admitting: Family Medicine

## 2017-08-24 ENCOUNTER — Other Ambulatory Visit: Payer: Self-pay | Admitting: Family Medicine

## 2017-08-24 DIAGNOSIS — R229 Localized swelling, mass and lump, unspecified: Principal | ICD-10-CM

## 2017-08-24 DIAGNOSIS — IMO0002 Reserved for concepts with insufficient information to code with codable children: Secondary | ICD-10-CM

## 2017-08-25 ENCOUNTER — Other Ambulatory Visit (HOSPITAL_COMMUNITY): Payer: Self-pay | Admitting: Family Medicine

## 2017-08-25 DIAGNOSIS — N63 Unspecified lump in unspecified breast: Secondary | ICD-10-CM

## 2017-09-04 ENCOUNTER — Other Ambulatory Visit: Payer: Medicare Other

## 2017-09-04 ENCOUNTER — Other Ambulatory Visit: Payer: Self-pay

## 2017-09-17 ENCOUNTER — Ambulatory Visit
Admission: RE | Admit: 2017-09-17 | Discharge: 2017-09-17 | Disposition: A | Discharge status: Home / Self Care | Payer: Medicare Other | Source: Ambulatory Visit | Attending: Family Medicine | Admitting: Family Medicine

## 2017-09-17 ENCOUNTER — Other Ambulatory Visit: Payer: Medicare Other

## 2017-09-17 DIAGNOSIS — M539 Dorsopathy, unspecified: Secondary | ICD-10-CM

## 2017-09-17 DIAGNOSIS — G8928 Other chronic postprocedural pain: Secondary | ICD-10-CM

## 2017-09-17 MED ORDER — GADOBENATE DIMEGLUMINE 529 MG/ML IV SOLN
15.0000 mL | Freq: Once | INTRAVENOUS | Status: AC | PRN
  Administered 2017-09-17: 15 mL via INTRAVENOUS

## 2017-09-22 ENCOUNTER — Other Ambulatory Visit: Payer: Medicare Other

## 2017-11-24 ENCOUNTER — Inpatient Hospital Stay
Admit: 2017-11-24 | Discharge: 2017-11-24 | Disposition: A | Discharge status: Home / Self Care | Payer: MEDICARE | Attending: Emergency Medicine

## 2017-11-24 DIAGNOSIS — K029 Dental caries, unspecified: Secondary | ICD-10-CM

## 2017-11-24 MED ORDER — PENICILLIN V-K 500 MG TAB
500 mg | ORAL_TABLET | Freq: Two times a day (BID) | ORAL | 0 refills | Status: AC
Start: 2017-11-24 — End: 2017-12-04

## 2017-11-24 NOTE — ED Provider Notes (Signed)
Patient states she has been having ankle pain and swelling for the past several days.  She has an appointment with a dentist on Friday to be evaluated for dental extraction.  The pain is ringing progressively worse and she came here for evaluation.  She is comes as achy, nonradiating pain, worse with eating or drinking.    Elements of this note were created using speech recognition software.  As such, errors of speech recognition may be present.             Past Medical History:   Diagnosis Date   ??? COPD    ??? Hypertension    ??? Psychiatric disorder     depression, anxiety       Past Surgical History:   Procedure Laterality Date   ??? HX GYN      BTL   ??? HX ORTHOPAEDIC     ??? NEUROLOGICAL PROCEDURE UNLISTED      nerve in left arm surgery 2009         History reviewed. No pertinent family history.    Social History     Socioeconomic History   ??? Marital status: SINGLE     Spouse name: Not on file   ??? Number of children: Not on file   ??? Years of education: Not on file   ??? Highest education level: Not on file   Social Needs   ??? Financial resource strain: Not on file   ??? Food insecurity - worry: Not on file   ??? Food insecurity - inability: Not on file   ??? Transportation needs - medical: Not on file   ??? Transportation needs - non-medical: Not on file   Occupational History   ??? Not on file   Tobacco Use   ??? Smoking status: Current Every Day Smoker     Packs/day: 0.50   Substance and Sexual Activity   ??? Alcohol use: No   ??? Drug use: No   ??? Sexual activity: Yes   Other Topics Concern   ??? Not on file   Social History Narrative   ??? Not on file         ALLERGIES: Neurontin [gabapentin]    Review of Systems   Constitutional: Negative for chills and fever.   Gastrointestinal: Negative for nausea and vomiting.   All other systems reviewed and are negative.      Vitals:    11/24/17 0730   BP: 96/79   Pulse: 93   Resp: 20   Temp: 97.5 ??F (36.4 ??C)   SpO2: 96%            Physical Exam    Constitutional: She is oriented to person, place, and time. She appears well-developed and well-nourished.   HENT:   Head: Normocephalic and atraumatic.   Poor overall dentition  Teeth #10 and 11 eroded almost down to gumline with gumline tenderness to palpation   Eyes: Conjunctivae are normal. Pupils are equal, round, and reactive to light.   Neck: Normal range of motion. Neck supple.   Musculoskeletal: She exhibits no edema or tenderness.   Neurological: She is alert and oriented to person, place, and time.   Skin: Skin is warm and dry.   Psychiatric: She has a normal mood and affect. Her behavior is normal.   Nursing note and vitals reviewed.       MDM  Number of Diagnoses or Management Options  Dental caries: new and requires workup  Dental infection: new and requires  workup  Risk of Complications, Morbidity, and/or Mortality  Presenting problems: moderate  Diagnostic procedures: low  Management options: low    Patient Progress  Patient progress: stable         Procedures

## 2017-11-24 NOTE — ED Triage Notes (Signed)
Patient complains of left upper toothache that has been ongoing for several weeks but got much worse over the past 2 days with swelling to left side of face.

## 2018-10-05 ENCOUNTER — Inpatient Hospital Stay
Admit: 2018-10-05 | Discharge: 2018-10-06 | Disposition: A | Discharge status: Home / Self Care | Payer: MEDICARE | Attending: Emergency Medicine

## 2018-10-05 DIAGNOSIS — R1084 Generalized abdominal pain: Secondary | ICD-10-CM

## 2018-10-05 NOTE — ED Notes (Signed)
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 Pt states she has since had pain in her gallbladder, has known stones in her gallbladder, pt also states leg cramping, thinks her electrolytes are out of balance. Pt states nausea, negative v/d, pt states abdominal ascites.

## 2018-10-05 NOTE — ED Provider Notes (Signed)
Patient has a history of cirrhosis secondary to hepatitis C.  She states she has been having vomiting and diarrhea with abdominal pain off and on for the past month.  She was admitted at [facility], stayed there for 6 days for low potassium and low magnesium.  She states her symptoms have persisted.  She is taking supplemental potassium though she does not know the dose.  She has been taking her medications as prescribed.  She has a history of gallstones, states her pain is similar to what she has experienced in the past with her gallstones.           Past Medical History:   Diagnosis Date   ??? COPD    ??? Hypertension    ??? Psychiatric disorder     depression, anxiety       Past Surgical History:   Procedure Laterality Date   ??? HX GYN      BTL   ??? HX ORTHOPAEDIC     ??? NEUROLOGICAL PROCEDURE UNLISTED      nerve in left arm surgery 2009         History reviewed. No pertinent family history.    Social History     Socioeconomic History   ??? Marital status: SINGLE     Spouse name: Not on file   ??? Number of children: Not on file   ??? Years of education: Not on file   ??? Highest education level: Not on file   Occupational History   ??? Not on file   Social Needs   ??? Financial resource strain: Not on file   ??? Food insecurity:     Worry: Not on file     Inability: Not on file   ??? Transportation needs:     Medical: Not on file     Non-medical: Not on file   Tobacco Use   ??? Smoking status: Current Every Day Smoker     Packs/day: 0.50   Substance and Sexual Activity   ??? Alcohol use: No   ??? Drug use: No   ??? Sexual activity: Yes   Lifestyle   ??? Physical activity:     Days per week: Not on file     Minutes per session: Not on file   ??? Stress: Not on file   Relationships   ??? Social connections:     Talks on phone: Not on file     Gets together: Not on file     Attends religious service: Not on file     Active member of club or organization: Not on file     Attends meetings of clubs or organizations: Not on file     Relationship status: Not on  file   ??? Intimate partner violence:     Fear of current or ex partner: Not on file     Emotionally abused: Not on file     Physically abused: Not on file     Forced sexual activity: Not on file   Other Topics Concern   ??? Not on file   Social History Narrative   ??? Not on file         ALLERGIES: Neurontin [gabapentin]    Review of Systems   Constitutional: Negative for chills and fever.   Gastrointestinal: Positive for abdominal pain, diarrhea, nausea and vomiting.   All other systems reviewed and are negative.      Vitals:    10/05/18 1946 10/05/18 2231 10/05/18 2231 10/05/18 2236  BP: 126/84 174/79  144/60   Pulse: 83 70  69   Resp: 16 16     Temp: 98.3 ??F (36.8 ??C)      SpO2: 96% 93% 94% 97%   Weight: 77.6 kg (171 lb)      Height: 5\' 5"  (1.651 m)               Physical Exam   Constitutional: She is oriented to person, place, and time. She appears well-developed and well-nourished.   HENT:   Head: Normocephalic and atraumatic.   Eyes: Pupils are equal, round, and reactive to light. Conjunctivae are normal.   Neck: Normal range of motion. Neck supple.   Cardiovascular: Normal rate, regular rhythm and normal heart sounds.   Pulmonary/Chest: Effort normal and breath sounds normal.   Abdominal: Soft. Bowel sounds are normal. She exhibits no distension. There is no tenderness. There is no guarding.   Musculoskeletal: She exhibits no edema or tenderness.   Neurological: She is alert and oriented to person, place, and time.   Skin: Skin is warm and dry.   Psychiatric: She has a normal mood and affect. Her behavior is normal.   Nursing note and vitals reviewed.       MDM  Number of Diagnoses or Management Options  Abdominal pain, generalized: established and worsening  Cirrhosis of liver without ascites, unspecified hepatic cirrhosis type (HCC):   Hypokalemia: minor  Diagnosis management comments: 1:47 AM discussed results with patient, unremarkable labs other than hypokalemia.  She was instructed to double her dose of  potassium over the next 3 days.       Amount and/or Complexity of Data Reviewed  Clinical lab tests: ordered and reviewed    Risk of Complications, Morbidity, and/or Mortality  Presenting problems: moderate  Diagnostic procedures: moderate  Management options: moderate    Patient Progress  Patient progress: stable         Procedures

## 2018-10-05 NOTE — ED Provider Notes (Signed)
Patient has a history of cirrhosis secondary to hepatitis C.  She states she has been having vomiting and diarrhea with abdominal pain off and on for the past month.  She was admitted at [facility], stayed there for 6 days for low potassium and low magnesium.  She states her symptoms have persisted.  She is taking supplemental potassium though she does not know the dose.  She has been taking her medications as prescribed.  She has a history of gallstones, states her pain is similar to what she has experienced in the past with her gallstones.           Past Medical History:   Diagnosis Date   ??? COPD    ??? Hypertension    ??? Psychiatric disorder     depression, anxiety       Past Surgical History:   Procedure Laterality Date   ??? HX GYN      BTL   ??? HX ORTHOPAEDIC     ??? NEUROLOGICAL PROCEDURE UNLISTED      nerve in left arm surgery 2009         History reviewed. No pertinent family history.    Social History     Socioeconomic History   ??? Marital status: SINGLE     Spouse name: Not on file   ??? Number of children: Not on file   ??? Years of education: Not on file   ??? Highest education level: Not on file   Occupational History   ??? Not on file   Social Needs   ??? Financial resource strain: Not on file   ??? Food insecurity:     Worry: Not on file     Inability: Not on file   ??? Transportation needs:     Medical: Not on file     Non-medical: Not on file   Tobacco Use   ??? Smoking status: Current Every Day Smoker     Packs/day: 0.50   Substance and Sexual Activity   ??? Alcohol use: No   ??? Drug use: No   ??? Sexual activity: Yes   Lifestyle   ??? Physical activity:     Days per week: Not on file     Minutes per session: Not on file   ??? Stress: Not on file   Relationships   ??? Social connections:     Talks on phone: Not on file     Gets together: Not on file     Attends religious service: Not on file     Active member of club or organization: Not on file     Attends meetings of clubs or organizations: Not on file      Relationship status: Not on file   ??? Intimate partner violence:     Fear of current or ex partner: Not on file     Emotionally abused: Not on file     Physically abused: Not on file     Forced sexual activity: Not on file   Other Topics Concern   ??? Not on file   Social History Narrative   ??? Not on file         ALLERGIES: Neurontin [gabapentin]    Review of Systems   Constitutional: Negative for chills and fever.   Gastrointestinal: Positive for abdominal pain, diarrhea, nausea and vomiting.   All other systems reviewed and are negative.      Vitals:    10/05/18 1946 10/05/18 2231 10/05/18 2231 10/05/18 2236  BP: 126/84 174/79  144/60   Pulse: 83 70  69   Resp: 16 16     Temp: 98.3 ??F (36.8 ??C)      SpO2: 96% 93% 94% 97%   Weight: 77.6 kg (171 lb)      Height: 5\' 5"  (1.651 m)               Physical Exam   Constitutional: She is oriented to person, place, and time. She appears well-developed and well-nourished.   HENT:   Head: Normocephalic and atraumatic.   Eyes: Pupils are equal, round, and reactive to light. Conjunctivae are normal.   Neck: Normal range of motion. Neck supple.   Cardiovascular: Normal rate, regular rhythm and normal heart sounds.   Pulmonary/Chest: Effort normal and breath sounds normal.   Abdominal: Soft. Bowel sounds are normal. She exhibits no distension. There is no tenderness. There is no guarding.   Musculoskeletal: She exhibits no edema or tenderness.   Neurological: She is alert and oriented to person, place, and time.   Skin: Skin is warm and dry.   Psychiatric: She has a normal mood and affect. Her behavior is normal.   Nursing note and vitals reviewed.       MDM  Number of Diagnoses or Management Options  Abdominal pain, generalized: established and worsening  Cirrhosis of liver without ascites, unspecified hepatic cirrhosis type (HCC):   Hypokalemia: minor  Diagnosis management comments: 1:47 AM discussed results with patient,  unremarkable labs other than hypokalemia.  She was instructed to double her dose of potassium over the next 3 days.       Amount and/or Complexity of Data Reviewed  Clinical lab tests: ordered and reviewed    Risk of Complications, Morbidity, and/or Mortality  Presenting problems: moderate  Diagnostic procedures: moderate  Management options: moderate    Patient Progress  Patient progress: stable         Procedures

## 2018-10-05 NOTE — ED Triage Notes (Signed)
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 Pt states she has since had pain in her gallbladder, has known stones in her gallbladder, pt also states leg cramping, thinks her electrolytes are out of balance. Pt states nausea, negative v/d, pt states abdominal ascites.

## 2018-10-06 LAB — COMPREHENSIVE METABOLIC PANEL
ALT: 12 U/L (ref 12–65)
AST: 24 U/L (ref 15–37)
Albumin/Globulin Ratio: 0.8 — ABNORMAL LOW (ref 1.2–3.5)
Albumin: 3.4 g/dL — ABNORMAL LOW (ref 3.5–5.0)
Alkaline Phosphatase: 151 U/L — ABNORMAL HIGH (ref 50–136)
Anion Gap: 8 mmol/L (ref 7–16)
BUN: 7 MG/DL (ref 6–23)
CO2: 24 mmol/L (ref 21–32)
Calcium: 8.6 MG/DL (ref 8.3–10.4)
Chloride: 109 mmol/L — ABNORMAL HIGH (ref 98–107)
Creatinine: 0.94 MG/DL (ref 0.6–1.0)
EGFR IF NonAfrican American: >60 mL/min/{1.73_m2} (ref >60)
GFR African American: >60 mL/min/{1.73_m2} (ref >60)
Globulin: 4.3 g/dL — ABNORMAL HIGH (ref 2.3–3.5)
Glucose: 112 mg/dL — ABNORMAL HIGH (ref 65–100)
Potassium: 3 mmol/L — ABNORMAL LOW (ref 3.5–5.1)
Sodium: 141 mmol/L (ref 136–145)
Total Bilirubin: 0.6 MG/DL (ref 0.2–1.1)
Total Protein: 7.7 g/dL (ref 6.3–8.2)

## 2018-10-06 LAB — CBC WITH AUTO DIFFERENTIAL
Basophils %: 1 % (ref 0.0–2.0)
Basophils Absolute: 0.1 10*3/uL (ref 0.0–0.2)
Eosinophils %: 2 % (ref 0.5–7.8)
Eosinophils Absolute: 0.1 10*3/uL (ref 0.0–0.8)
Granulocyte Absolute Count: 0 10*3/uL (ref 0.0–0.5)
Hematocrit: 38.9 % (ref 35.8–46.3)
Hemoglobin: 12.7 g/dL (ref 11.7–15.4)
Immature Granulocytes: 0 % (ref 0.0–5.0)
Lymphocytes %: 31 % (ref 13–44)
Lymphocytes Absolute: 2.6 10*3/uL (ref 0.5–4.6)
MCH: 27.4 PG (ref 26.1–32.9)
MCHC: 32.6 g/dL (ref 31.4–35.0)
MCV: 84 FL (ref 79.6–97.8)
MPV: 10.6 FL (ref 9.4–12.3)
Monocytes %: 6 % (ref 4.0–12.0)
Monocytes Absolute: 0.5 10*3/uL (ref 0.1–1.3)
NRBC Absolute: 0 10*3/uL (ref 0.0–0.2)
Neutrophils %: 61 % (ref 43–78)
Neutrophils Absolute: 5.2 10*3/uL (ref 1.7–8.2)
Platelets: 142 10*3/uL — ABNORMAL LOW (ref 150–450)
RBC: 4.63 M/uL (ref 4.05–5.2)
RDW: 16 % — ABNORMAL HIGH (ref 11.9–14.6)
WBC: 8.5 10*3/uL (ref 4.3–11.1)

## 2018-10-06 LAB — LIPASE
Lipase: 206 U/L (ref 73–393)
Lipase: 206 U/L (ref 73–393)

## 2018-10-06 LAB — URINE MICROSCOPIC

## 2018-10-06 LAB — MAGNESIUM
Magnesium: 1.9 mg/dL (ref 1.8–2.4)
Magnesium: 1.9 mg/dL (ref 1.8–2.4)

## 2018-10-06 LAB — CBC WITH AUTOMATED DIFF
ABS. BASOPHILS: 0.1 10*3/uL (ref 0.0–0.2)
ABS. EOSINOPHILS: 0.1 10*3/uL (ref 0.0–0.8)
ABS. IMM. GRANS.: 0 10*3/uL (ref 0.0–0.5)
ABS. LYMPHOCYTES: 2.6 10*3/uL (ref 0.5–4.6)
ABS. MONOCYTES: 0.5 10*3/uL (ref 0.1–1.3)
ABS. NEUTROPHILS: 5.2 10*3/uL (ref 1.7–8.2)
ABSOLUTE NRBC: 0 10*3/uL (ref 0.0–0.2)
BASOPHILS: 1 % (ref 0.0–2.0)
EOSINOPHILS: 2 % (ref 0.5–7.8)
HCT: 38.9 % (ref 35.8–46.3)
HGB: 12.7 g/dL (ref 11.7–15.4)
IMMATURE GRANULOCYTES: 0 % (ref 0.0–5.0)
LYMPHOCYTES: 31 % (ref 13–44)
MCH: 27.4 PG (ref 26.1–32.9)
MCHC: 32.6 g/dL (ref 31.4–35.0)
MCV: 84 FL (ref 79.6–97.8)
MONOCYTES: 6 % (ref 4.0–12.0)
MPV: 10.6 FL (ref 9.4–12.3)
NEUTROPHILS: 61 % (ref 43–78)
PLATELET: 142 10*3/uL — ABNORMAL LOW (ref 150–450)
RBC: 4.63 M/uL (ref 4.05–5.2)
RDW: 16 % — ABNORMAL HIGH (ref 11.9–14.6)
WBC: 8.5 10*3/uL (ref 4.3–11.1)

## 2018-10-06 LAB — METABOLIC PANEL, COMPREHENSIVE
A-G Ratio: 0.8 — ABNORMAL LOW (ref 1.2–3.5)
ALT (SGPT): 12 U/L (ref 12–65)
AST (SGOT): 24 U/L (ref 15–37)
Albumin: 3.4 g/dL — ABNORMAL LOW (ref 3.5–5.0)
Alk. phosphatase: 151 U/L — ABNORMAL HIGH (ref 50–136)
Anion gap: 8 mmol/L (ref 7–16)
BUN: 7 MG/DL (ref 6–23)
Bilirubin, total: 0.6 MG/DL (ref 0.2–1.1)
CO2: 24 mmol/L (ref 21–32)
Calcium: 8.6 MG/DL (ref 8.3–10.4)
Chloride: 109 mmol/L — ABNORMAL HIGH (ref 98–107)
Creatinine: 0.94 MG/DL (ref 0.6–1.0)
GFR est AA: >60 mL/min/{1.73_m2} (ref >60)
GFR est non-AA: >60 mL/min/{1.73_m2} (ref >60)
Globulin: 4.3 g/dL — ABNORMAL HIGH (ref 2.3–3.5)
Glucose: 112 mg/dL — ABNORMAL HIGH (ref 65–100)
Potassium: 3 mmol/L — ABNORMAL LOW (ref 3.5–5.1)
Protein, total: 7.7 g/dL (ref 6.3–8.2)
Sodium: 141 mmol/L (ref 136–145)

## 2018-10-06 NOTE — ED Notes (Signed)
I have reviewed discharge instructions with the patient.  The patient verbalized understanding.    Patient left ED via Discharge Method: ambulatory to Home with friend.    Opportunity for questions and clarification provided.       Patient given 0 scripts.         To continue your aftercare when you leave the hospital, you may receive an automated call from our care team to check in on how you are doing.  This is a free service and part of our promise to provide the best care and service to meet your aftercare needs." If you have questions, or wish to unsubscribe from this service please call [phone].  Thank you for Choosing our [facility].

## 2018-10-06 NOTE — ED Notes (Signed)
I have reviewed discharge instructions with the patient.  The patient verbalized understanding.    Patient left ED via Discharge Method: ambulatory to Home with friend.    Opportunity for questions and clarification provided.       Patient given 0 scripts.         To continue your aftercare when you leave the hospital, you may receive an automated call from our care team to check in on how you are doing.  This is a free service and part of our promise to provide the best care and service to meet your aftercare needs.??? If you have questions, or wish to unsubscribe from this service please call [phone].  Thank you for Choosing our [facility].

## 2019-01-08 IMAGING — MR MR ABDOMEN WO/W CM MRCP
8 of 19 series · 14 of 48 positions shown · IV contrast (15ml Multihance)
Comparison: CT 02/04/2017

CLINICAL DATA: Right abdominal pain. Cholelithiasis. Biliary ductal
dilatation seen on recent CT.

EXAM:
MRI ABDOMEN WITHOUT AND WITH CONTRAST (INCLUDING MRCP)
TECHNIQUE: Multiplanar multisequence MR imaging of the abdomen was performed
both before and after the administration of intravenous contrast.
Heavily T2-weighted images of the biliary and pancreatic ducts were
obtained, and three-dimensional MRCP images were rendered by post
processing.
CONTRAST:  15mL MULTIHANCE GADOBENATE DIMEGLUMINE 529 MG/ML IV SOLN

[Series 4: T2 · coronal · 5.0mm · 1.06mm/px · 1 of 32 slices shown (1 of 2)]
[im 1/32]
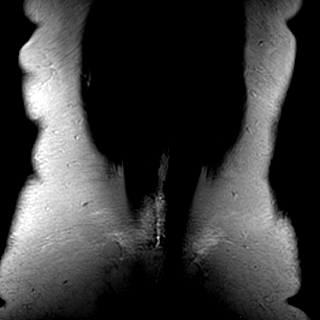

[Series 6: t2fs axial · axial · 5.0mm · 1.09mm/px · 1 of 40 slices shown]
[im 1/40]
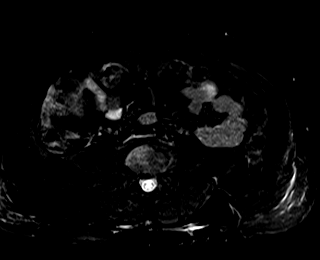

[Series 7: bSSFP · axial · 4.0mm · 1.37mm/px · 1 of 60 slices shown]
[im 1/60]
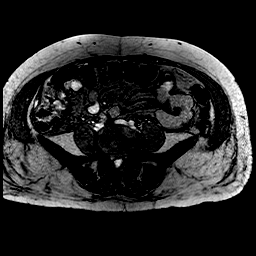

[Series 9: T2 · coronal · 1.2mm · 0.42mm/px · 4 of 116 slices shown (2 of 2)]
[im 1/116]
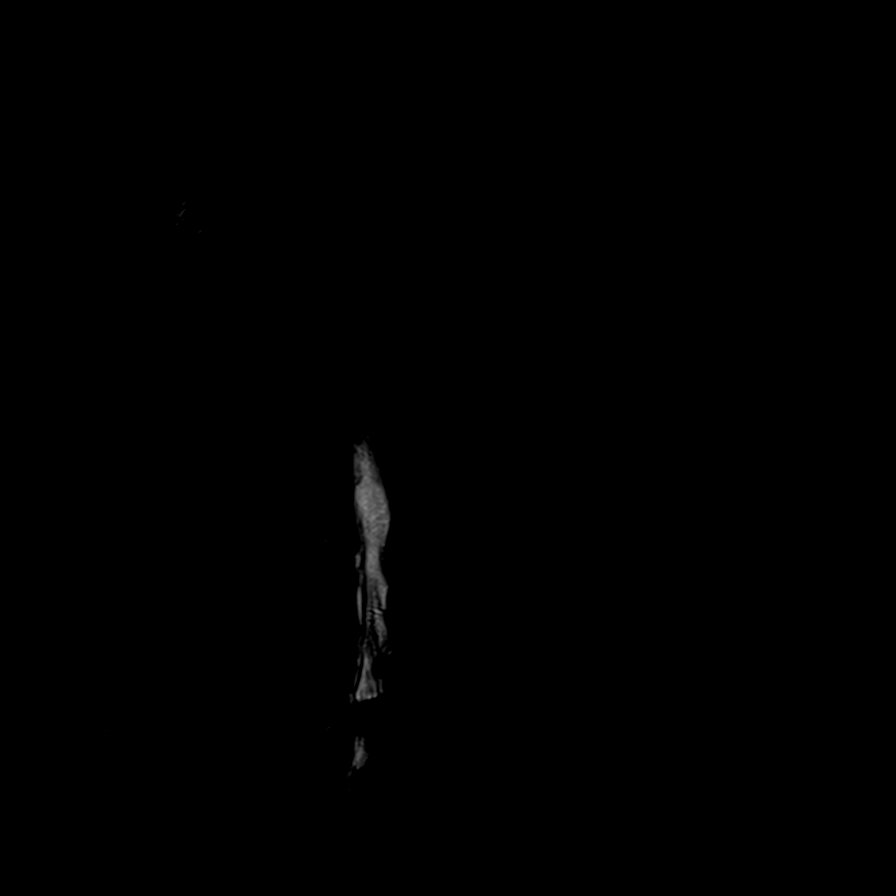
[im 39/116]
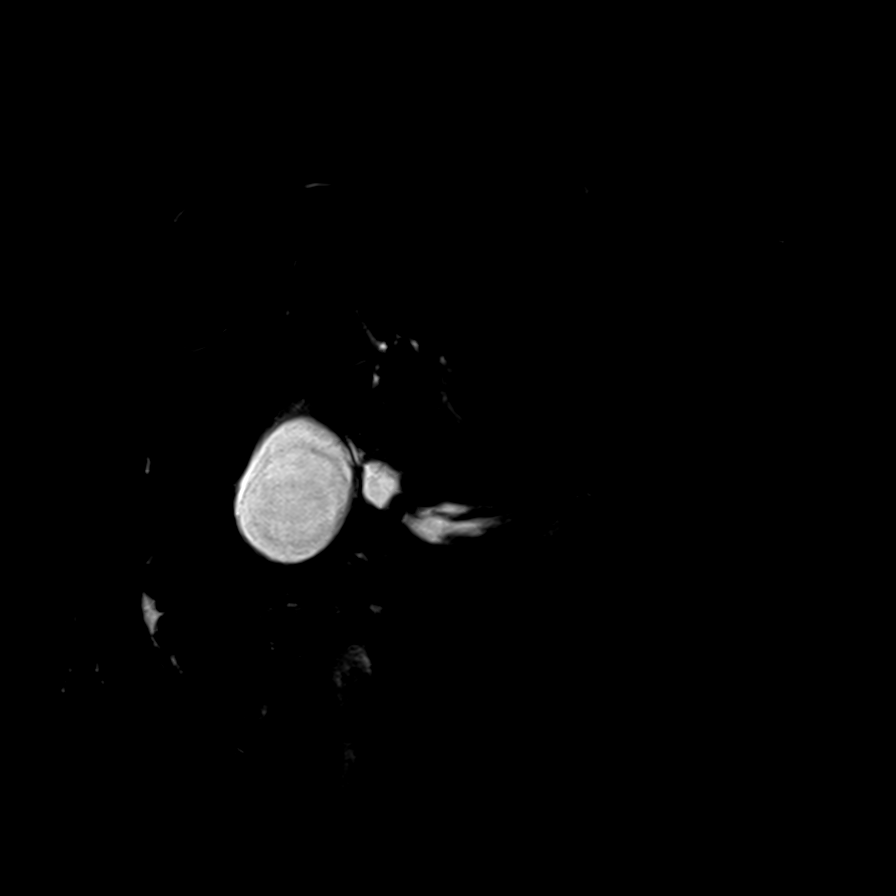
[im 77/116]
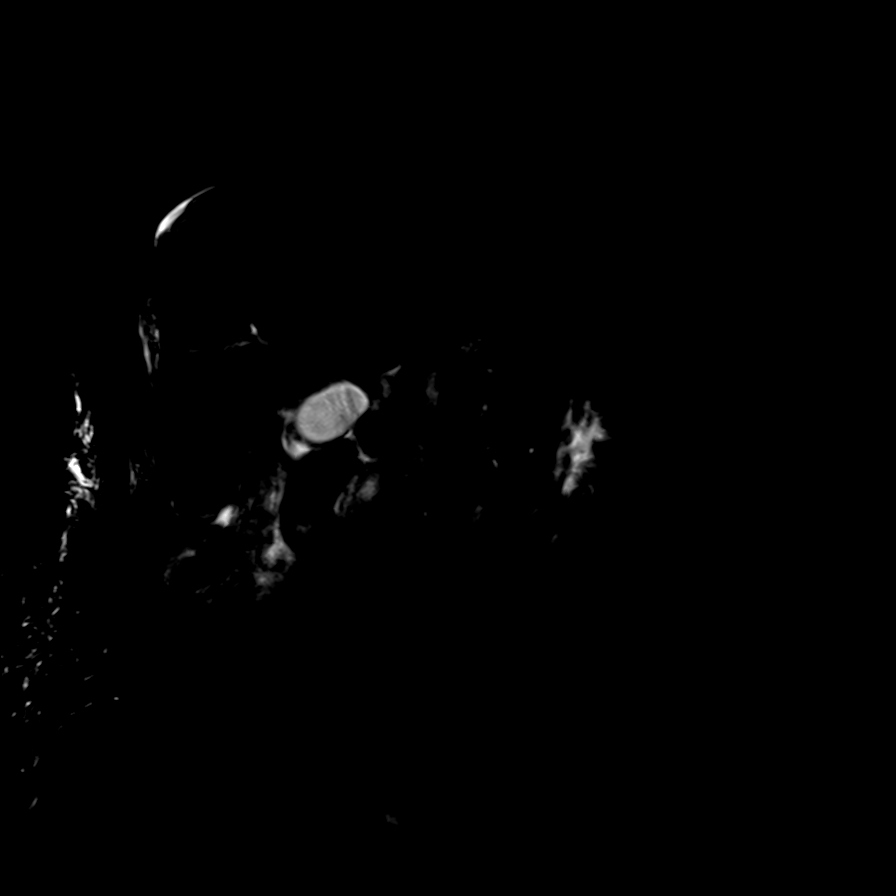
[im 116/116]
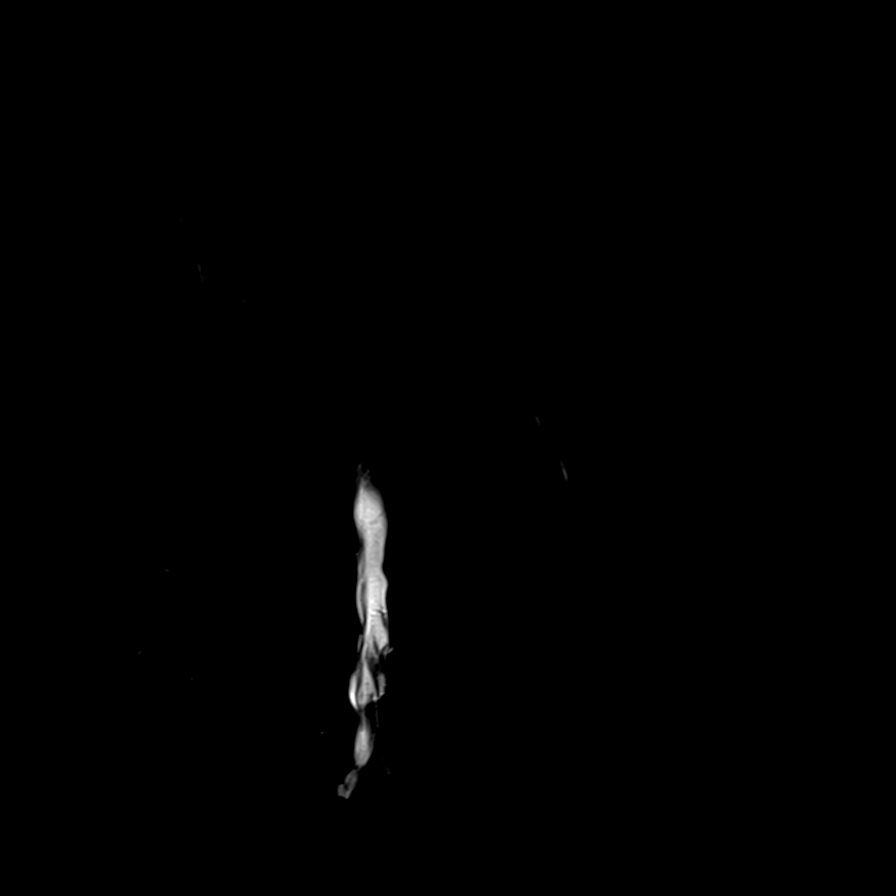

[Series 11: T1 · axial · 4.0mm · 0.54mm/px · z∈[-139,+113]mm · 2 of 64 slices shown (1 of 2)]
[im 1/64]
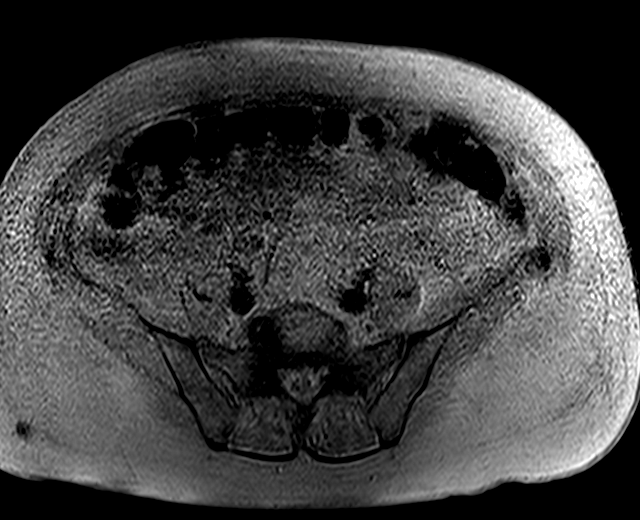
[im 64/64]
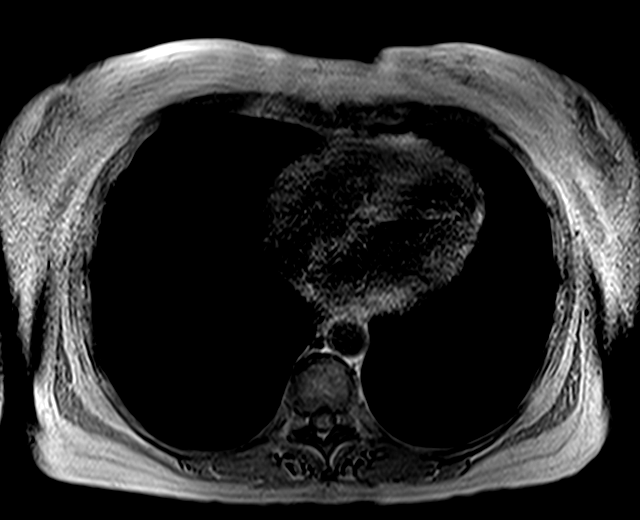

[Series 12: T1 · axial · 4.0mm · 0.54mm/px · z∈[-139,+113]mm · 2 of 64 slices shown (2 of 2)]
[im 1/64]
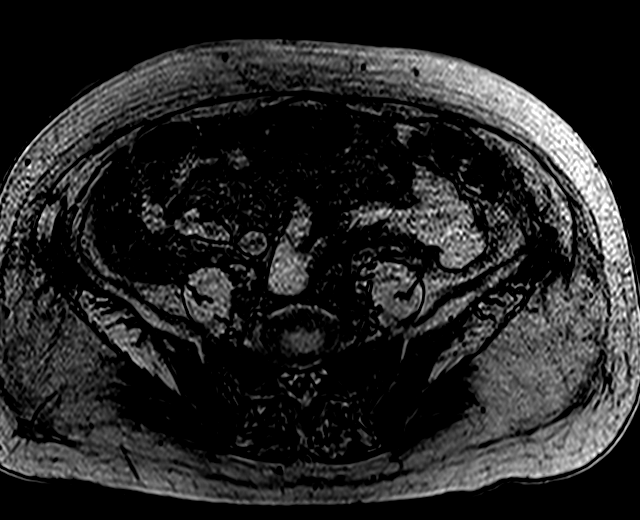
[im 64/64]
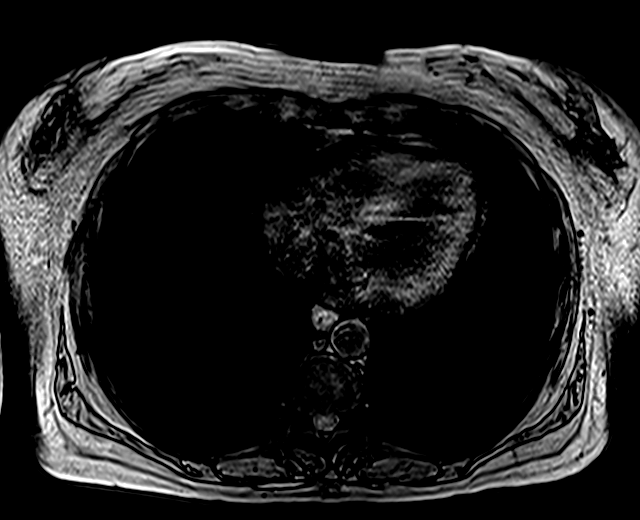

[Series 16: DWI · axial · 5.0mm · 0.87mm/px · z∈[-104,+130]mm · 2 of 80 slices shown (1 of 2)]
[im 1/80]
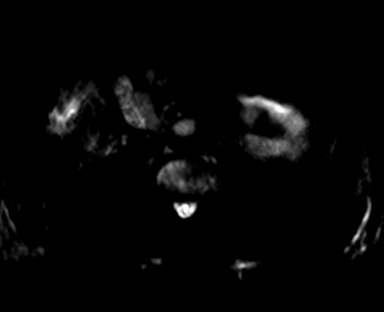
[im 80/80]
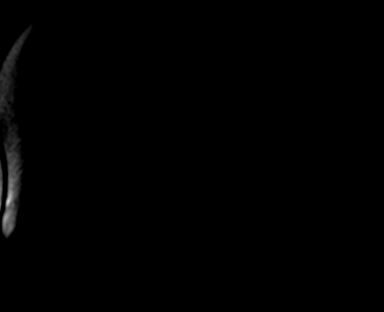

[Series 17: DWI · axial · 5.0mm · 0.87mm/px · 1 of 40 slices shown (2 of 2)]
[im 1/40]
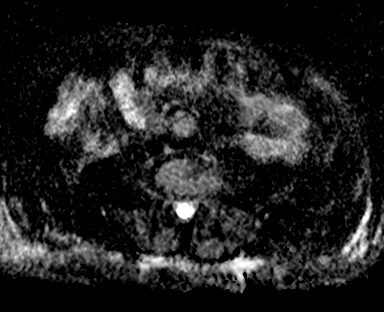

[14 of 48 positions shown; findings below may reference images not displayed]

FINDINGS: Lower chest: No acute findings.

Hepatobiliary: Changes of hepatic cirrhosis are demonstrated. No
liver mass identified. Minimal abdominal ascites noted.

Gallbladder is distended and contains small gallstones. There is
mild pericholecystic fluid, without significant gallbladder wall
thickening. Although this could be secondary to hepatic cirrhosis,
acute cholecystitis cannot be excluded.

Diffuse biliary ductal dilatation is seen, with common bile duct
measuring 16 mm. Abrupt stricture of the distal common bile duct is
seen. No definite choledocholithiasis seen.

Pancreas: No mass or inflammatory changes. No evidence of pancreatic
ductal dilatation or pancreas divisum.

Spleen: Mild splenomegaly, measuring 15 cm in length. No splenic
masses identified. This is consistent portal venous hypertension.

Adrenals/Urinary Tract: No masses identified. No evidence of
hydronephrosis.

Stomach/Bowel: Visualized portions within the abdomen are
unremarkable.

Vascular/Lymphatic: No pathologically enlarged lymph nodes
identified. No abdominal aortic aneurysm.

Other:  None.

Musculoskeletal:  No suspicious bone lesions identified.
IMPRESSION: Cholelithiasis. Gallbladder is distended with mild pericholecystic,
but no significant gallbladder wall thickening. Although this could
be secondary to hepatic cirrhosis, acute cholecystitis cannot
definitely be excluded. Recommend clinical correlation, and consider
nuclear medicine hepatobiliary scan if warranted clinically.

Diffuse biliary ductal dilatation, with abrupt stricture of the
distal common bile duct. No definite mass or choledocholithiasis
identified. Consider ERCP for further evaluation.

Hepatic cirrhosis.  No evidence of hepatic neoplasm.

Mild splenomegaly and minimal ascites, consistent with portal venous
hypertension.

## 2019-05-18 ENCOUNTER — Telehealth: Payer: Self-pay

## 2019-05-18 NOTE — Telephone Encounter (Signed)
PA approved for Xifaxan 550 mg tablets through [email or web address]. approval letter will be scanned in chart.

## 2019-05-31 LAB — HEMOGLOBIN A1C
Estimated Avg Glucose, External: 82 mg/dL (ref 77–114)
Hemoglobin A1C, External: 4.5 % (ref 4.3–5.6)

## 2020-04-27 LAB — HEMOGLOBIN A1C
Estimated Avg Glucose, External: 94 mg/dL
Hemoglobin A1C, External: 4.9 % (ref <5.7)

## 2021-02-19 NOTE — Telephone Encounter (Signed)
-----   Message from Barbie Banner sent at 02/19/2021 11:41 AM EDT -----  Subject: Message to Provider    QUESTIONS  Information for Provider? patient is returning a missed call  ---------------------------------------------------------------------------  --------------  CALL BACK INFO  What is the best way for the office to contact you? OK to leave message on   voicemail  Preferred Call Back Phone Number? [phone]  ---------------------------------------------------------------------------  --------------  SCRIPT ANSWERS  Relationship to Patient? Self

## 2021-02-19 NOTE — Telephone Encounter (Signed)
LVM for patient with the information for [facility], and for the Find A Doc line.

## 2021-02-19 NOTE — Telephone Encounter (Signed)
-----   Message from Chiquita Macadam sent at 02/18/2021  4:53 PM EDT -----  Subject: Appointment Request    Reason for Call: New Patient Request Appointment    QUESTIONS  Type of Appointment? New Patient/New to Provider  Reason for appointment request? No appointments available during search  Additional Information for Provider? PAtient calling in to reschd   September appointment due to being out of medications by the first of   April. Patient is taking inhaler, BP medication and laics and nadolol .   ---------------------------------------------------------------------------  --------------  CALL BACK INFO  What is the best way for the office to contact you? OK to leave message on   voicemail  Preferred Call Back Phone Number? [phone]  ---------------------------------------------------------------------------  --------------  SCRIPT ANSWERS  Relationship to Patient? Self  Have your symptoms changed? Yes  Is this the first appointment to establish care for a newborn? No  Have you been diagnosed with, awaiting test results for, or told that you   are suspected of having COVID-19 (Coronavirus)? (If patient has tested   negative or was tested as a requirement for work, school, or travel and   not based on symptoms, answer no)? No  Within the past 10 days have you developed any of the following symptoms   (answer "no" if symptoms have been present longer than 10 days or began   more than 10 days ago)? Fever or Chills, Cough, Shortness of breath or   difficulty breathing, Loss of taste or smell, Sore throat, Nasal   congestion, Sneezing or runny nose, Fatigue or generalized body aches   (answer no if pain is specific to a body part e.g. back pain), Diarrhea,   Headache? No  Have you had close contact with someone with COVID-19 in the last 7 days?   No  (Service Expert - click yes below to proceed with [employer] As Usual   Scheduling)? Yes

## 2021-02-19 NOTE — Telephone Encounter (Signed)
If the patient needs to be seen before we can see her, she will need to call the find a provider line.  LT

## 2021-03-06 ENCOUNTER — Ambulatory Visit: Attending: Internal Medicine | Primary: Internal Medicine

## 2021-03-06 ENCOUNTER — Encounter

## 2021-03-06 ENCOUNTER — Ambulatory Visit: Admit: 2021-03-06 | Discharge: 2021-03-06 | Payer: MEDICARE | Attending: Internal Medicine | Primary: Internal Medicine

## 2021-03-06 DIAGNOSIS — I10 Essential (primary) hypertension: Secondary | ICD-10-CM

## 2021-03-06 MED ORDER — CITALOPRAM 40 MG TAB
40 mg | ORAL_TABLET | ORAL | 2 refills | Status: DC
Start: 2021-03-06 — End: 2021-03-06

## 2021-03-06 MED ORDER — LISINOPRIL-HYDROCHLOROTHIAZIDE 20 MG-25 MG TAB
20-25 mg | ORAL_TABLET | Freq: Every day | ORAL | 2 refills | Status: DC
Start: 2021-03-06 — End: 2021-04-17

## 2021-03-06 MED ORDER — QVAR 40 MCG/ACTUATION METERED AEROSOL ORAL INHALER
40 mcg/actuation | Freq: Two times a day (BID) | RESPIRATORY_TRACT | 2 refills | Status: AC
Start: 2021-03-06 — End: ?

## 2021-03-06 MED ORDER — FLUTICASONE-SALMETEROL 250 MCG-50 MCG/DOSE DISK DEVICE FOR INHALATION
250-50 mcg/dose | RESPIRATORY_TRACT | 2 refills | Status: AC
Start: 2021-03-06 — End: ?

## 2021-03-06 MED ORDER — OMEPRAZOLE 40 MG CAP, DELAYED RELEASE
40 mg | ORAL_CAPSULE | Freq: Every day | ORAL | 2 refills | Status: AC
Start: 2021-03-06 — End: 2021-06-04

## 2021-03-06 MED ORDER — NADOLOL 20 MG TAB
20 mg | ORAL_TABLET | Freq: Every day | ORAL | 2 refills | Status: AC
Start: 2021-03-06 — End: 2021-04-05

## 2021-03-06 MED ORDER — POTASSIUM CHLORIDE SR 20 MEQ TAB, PARTICLES/CRYSTALS
20 mEq | ORAL_TABLET | ORAL | 2 refills | Status: AC
Start: 2021-03-06 — End: ?

## 2021-03-06 MED ORDER — LACTULOSE 20 GRAM/30 ML ORAL SOLUTION
20 gram/30 mL | Freq: Every evening | ORAL | 2 refills | Status: AC
Start: 2021-03-06 — End: 2021-06-04

## 2021-03-06 MED ORDER — FLUTICASONE-SALMETEROL 250 MCG-50 MCG/DOSE DISK DEVICE FOR INHALATION
250-50 mcg/dose | RESPIRATORY_TRACT | 2 refills | Status: DC
Start: 2021-03-06 — End: 2021-03-06

## 2021-03-06 MED ORDER — AMLODIPINE 10 MG TAB
10 mg | ORAL_TABLET | ORAL | 2 refills | Status: AC
Start: 2021-03-06 — End: ?

## 2021-03-06 MED ORDER — ALPRAZOLAM 0.25 MG TAB
0.25 mg | ORAL_TABLET | Freq: Two times a day (BID) | ORAL | 0 refills | Status: AC
Start: 2021-03-06 — End: 2021-04-05

## 2021-03-06 MED ORDER — FUROSEMIDE 20 MG TAB
20 mg | ORAL_TABLET | Freq: Every day | ORAL | 2 refills | Status: AC
Start: 2021-03-06 — End: 2021-06-04

## 2021-03-06 MED ORDER — ZINC SULFATE 220 MG (50 MG ELEMENTAL ZINC) CAP
50 mg zinc (220 mg) | ORAL_CAPSULE | ORAL | 2 refills | Status: DC
Start: 2021-03-06 — End: 2021-03-06

## 2021-03-06 MED ORDER — ONDANSETRON HCL 8 MG TAB
8 mg | ORAL_TABLET | ORAL | 2 refills | Status: AC
Start: 2021-03-06 — End: ?

## 2021-03-06 MED ORDER — CITALOPRAM 40 MG TAB
40 mg | ORAL_TABLET | ORAL | 2 refills | Status: AC
Start: 2021-03-06 — End: ?

## 2021-03-06 MED ORDER — ZINC SULFATE 220 MG (50 MG ELEMENTAL ZINC) CAP
50 mg zinc (220 mg) | ORAL_CAPSULE | ORAL | 2 refills | Status: AC
Start: 2021-03-06 — End: ?

## 2021-03-06 NOTE — Progress Notes (Signed)
Progress Notes by Rochell Mabie, [place] J, DO at 03/06/21 1340                Author: Jarreau Callanan, [place] J, DO  Service: --  Author Type: Physician       Filed: 03/06/21 1434  Encounter Date: 03/06/2021  Status: Signed          Editor: Hykeem Ojeda, [place] J, DO (Physician)                    [place] Celso Granja, D.O.    [facility]   [address]   [place], [place] [id number]   Tel: [phone]        History and Physical Office Visit           Patient Name:  Elizabeth Ingram      DOB:   02/18/1961        MRN:    [id number]         Today's Date: 03/06/21 1:39 PM        Subjective        The patient is a 60 y.o. year old female with a pmh as listed below. She presents today to establish care. 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 She follows with GI at [facility] but left [facility] and [facility] with [facility] appointment with them on April 26th. She sees Karlton LemonRobert Chapman is her transplant specialist out  of [place]. She states her MELD score is back down to 8 so she is off the transplant list now. 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 Patient states she is doing well with no acute complaints.       She states her mom passed away from liver failure. She states her both of her brothers also had liver problems. She states her father was killed when she was young. She states no history of cancer in her family. She states she eats well and she states  she does exercise some. She denies drinking alcohol. She is down to 8 cigarettes a day. She has smoked for 45-50 years. She used to smoke about a pack a day. She is uptodate on her colonoscopies.          Review of Systems    HENT: Negative.     Eyes: Negative.     Respiratory: Negative.     Cardiovascular: Negative.     Gastrointestinal: Negative.     Genitourinary: Negative.     Musculoskeletal: Positive for back pain.    Skin: Negative.     Neurological: Negative.      Psychiatric/Behavioral: Positive for depression. The patient is nervous/anxious .           Past Medical History:      Diagnosis  Date      ?  Anxiety and depression  03/06/2021      ?  Chronic hepatitis C without hepatic coma (HCC)        ?  Obesity (BMI 30.0-34.9)  03/06/2021      ?  Primary hypertension  03/06/2021            Social History            Socioeconomic History      ?  Marital status:  SINGLE          Spouse name:  Not on file      ?  Number of children:  Not on file      ?  Years of education:  Not on file      ?  Highest education level:  Not on file      Occupational History      ?  Not on file      Tobacco Use      ?  Smoking status:  Current Every Day Smoker          Packs/day:  0.50      ?  Smokeless tobacco:  Not on file      Substance and Sexual Activity      ?  Alcohol use:  No      ?  Drug use:  No      ?  Sexual activity:  Yes      Other Topics  Concern      ?  Not on file      Social History Narrative      ?  Not on file            Social Determinants of Health            Financial Resource Strain:       ?  Difficulty of Paying Living Expenses: Not on file      Food Insecurity:       ?  Worried About Running Out of Food in the Last Year: Not on file      ?  Ran Out of Food in the Last Year: Not on file      Transportation Needs:       ?  Lack of Transportation (Medical): Not on file      ?  Lack of Transportation (Non-Medical): Not on file      Physical Activity:       ?  Days of Exercise per Week: Not on file      ?  Minutes of Exercise per Session: Not on file      Stress:       ?  Feeling of Stress : Not on file      Social Connections:       ?  Frequency of Communication with Friends and Family: Not on file      ?  Frequency of Social Gatherings with Friends and Family: Not on file      ?  Attends Religious Services: Not on file      ?  Active Member of Clubs or Organizations: Not on file      ?  Attends Banker Meetings: Not on file      ?  Marital Status: Not on file       Intimate Partner Violence:       ?  Fear of Current or Ex-Partner: Not on file      ?  Emotionally Abused: Not on file      ?  Physically Abused: Not on file      ?  Sexually Abused: Not on file      Housing Stability:       ?  Unable to Pay for Housing in the Last Year: Not on file      ?  Number of Places Lived in the Last Year: Not on file      ?  Unstable Housing in the Last Year: Not on file  Current Outpatient Medications        Medication  Sig         ?  glecaprevir-pibrentasvir 100-40 mg tab  Take 3 Tabs by mouth.     ?  furosemide (LASIX) 20 mg tablet       ?  potassium chloride (K-DUR, KLOR-CON) 20 mEq tablet  take 1 tablet twice a day     ?  lactulose (CHRONULAC) 20 gram/30 mL soln solution  Take 20 g by mouth.     ?  lisinopril-hydrochlorothiazide (PRINZIDE, ZESTORETIC) 20-25 mg per tablet  Take  by mouth daily.     ?  beclomethasone (QVAR) 40 mcg/actuation inhaler  Take 1 Puff by inhalation two (2) times a day.     ?  albuterol (PROVENTIL, VENTOLIN) 90 mcg/Actuation inhaler  Take 1-2 Puffs by inhalation every four (4) hours as needed for Wheezing.         ?  sertraline (ZOLOFT) 50 mg tablet  Take  by mouth daily.          No current facility-administered medications for this visit.           Objective          Vitals:          03/06/21 1339        Height:  5\' 5"  (1.651 m)        Weight:  185 lb (83.9 kg)   Comment: with shoes         Physical Exam:   Constitutional: Appears well kempt. Alert/oriented x3. In no acute distress.   Head: Normocephalic No trauma. No deformity. No bruits.    Neck: Supple. ROM normal. No tenderness. No masses.   Eyes: PERRLA. Conjunctivae normal. No discharge.   Ears: External ears normal. TM normal. No discharge from ears.    Nose: Nose normal. Nares patent.    Throat: Clear. No exudates. No erythema.    Cardiac: Heart with normal rate/rhythm. No murmurs. No gallops. Pulses normal.    Pulmonary: Lungs clear to auscultation bilaterally. In no respiratory  distress. No wheezing. No rales. No rhonci.    Gastrointestinal: Bowel sounds present. Abdomen soft and nondistended.    Musculoskeletal: Moves all extremities with good ROM. Non-tender. No swelling. No edema.    Neurological: No numbness. No tingling. Alert and oriented. At baseline. No confusion. Patellar Reflexes 2+.   Psychiatric: Normal thought content. Normal behavior. Normal judgment.         Recommendations        Assessment:     Patient Active Problem List        Diagnosis  Code         ?  Primary hypertension  I10     ?  Chronic hepatitis C without hepatic coma (HCC)  B18.2     ?  Anxiety and depression  F41.9, F32.A         ?  Obesity (BMI 30.0-34.9)  E66.9         Plan:   -LDCT screen   -Mammogram    -Papsmear and referral to OBGYN   -referral to pain management    -continue current treatment plan    -follow up with GI   -check lipid panel and CBC   -The patient expresses understanding of the plan as I've explained it to her and is in agreement  with the current plan.   -Follow up in 6 weeks      Time Spent  in Patient Room: 20 minutes   Time Spent Pre- and Post Reviewing patient's chart: 25 minutes   Total Time: 45 minutes      Signed: [place] Camielle Sizer, D.O.   03/06/21   1:39 PM

## 2021-03-06 NOTE — Telephone Encounter (Signed)
Pt called and stated that she was seen today in our office. Pt states that her Albuterol inhaler and Albuterol solution for her nebulizer was not sent in to the pharmacy. I reconciled

## 2021-03-06 NOTE — Telephone Encounter (Signed)
Marchelle Folks called with questions on 2 prescriptions for patient.  Amlodipine and Zofran.  Requested a return phone call

## 2021-03-07 ENCOUNTER — Encounter

## 2021-03-07 LAB — CBC WITH AUTO DIFFERENTIAL
Basophils %: 0 %
Basophils Absolute: 0 10*3/uL (ref 0.0–0.2)
Eosinophils %: 1 %
Eosinophils Absolute: 0.1 10*3/uL (ref 0.0–0.4)
Granulocyte Absolute Count: 0 10*3/uL (ref 0.0–0.1)
Hematocrit: 42.4 % (ref 34.0–46.6)
Hemoglobin: 13.9 g/dL (ref 11.1–15.9)
Immature Granulocytes: 0 %
Lymphocytes %: 29 %
Lymphocytes Absolute: 2 10*3/uL (ref 0.7–3.1)
MCH: 29.6 pg (ref 26.6–33.0)
MCHC: 32.8 g/dL (ref 31.5–35.7)
MCV: 90 fL (ref 79–97)
Monocytes %: 7 %
Monocytes Absolute: 0.5 10*3/uL (ref 0.1–0.9)
Neutrophils %: 63 %
Neutrophils Absolute: 4.3 10*3/uL (ref 1.4–7.0)
Platelets: 99 10*3/uL — CL (ref 150–450)
RBC: 4.7 x10E6/uL (ref 3.77–5.28)
RDW: 14.6 % (ref 11.7–15.4)
WBC: 6.8 10*3/uL (ref 3.4–10.8)

## 2021-03-07 LAB — LIPID PANEL
Cholesterol, Total: 137 mg/dL (ref 100–199)
Cholesterol, total: 137 mg/dL (ref 100–199)
HDL Cholesterol: 42 mg/dL (ref >39)
HDL: 42 mg/dL (ref >39)
LDL Calculated: 78 mg/dL (ref 0–99)
LDL, calculated: 78 mg/dL (ref 0–99)
Triglyceride: 89 mg/dL (ref 0–149)
Triglycerides: 89 mg/dL (ref 0–149)
VLDL, calculated: 17 mg/dL (ref 5–40)
VLDL: 17 mg/dL (ref 5–40)

## 2021-03-07 LAB — CBC WITH AUTOMATED DIFF
ABS. BASOPHILS: 0 10*3/uL (ref 0.0–0.2)
ABS. EOSINOPHILS: 0.1 10*3/uL (ref 0.0–0.4)
ABS. IMM. GRANS.: 0 10*3/uL (ref 0.0–0.1)
ABS. MONOCYTES: 0.5 10*3/uL (ref 0.1–0.9)
ABS. NEUTROPHILS: 4.3 10*3/uL (ref 1.4–7.0)
Abs Lymphocytes: 2 10*3/uL (ref 0.7–3.1)
BASOPHILS: 0 %
EOSINOPHILS: 1 %
HCT: 42.4 % (ref 34.0–46.6)
HGB: 13.9 g/dL (ref 11.1–15.9)
IMMATURE GRANULOCYTES: 0 %
Lymphocytes: 29 %
MCH: 29.6 pg (ref 26.6–33.0)
MCHC: 32.8 g/dL (ref 31.5–35.7)
MCV: 90 fL (ref 79–97)
MONOCYTES: 7 %
NEUTROPHILS: 63 %
PLATELET: 99 10*3/uL — CL (ref 150–450)
RBC: 4.7 x10E6/uL (ref 3.77–5.28)
RDW: 14.6 % (ref 11.7–15.4)
WBC: 6.8 10*3/uL (ref 3.4–10.8)

## 2021-03-07 MED ORDER — IPRATROPIUM-ALBUTEROL 2.5 MG-0.5 MG/3 ML NEB SOLUTION
2.5 mg-0.5 mg/3 ml | INHALATION_SOLUTION | Freq: Every day | RESPIRATORY_TRACT | 1 refills | Status: DC | PRN
Start: 2021-03-07 — End: 2021-03-07

## 2021-03-07 MED ORDER — ALBUTEROL SULFATE HFA 90 MCG/ACTUATION AEROSOL INHALER
90 mcg/actuation | RESPIRATORY_TRACT | 1 refills | Status: AC | PRN
Start: 2021-03-07 — End: ?

## 2021-03-07 MED ORDER — IPRATROPIUM-ALBUTEROL 2.5 MG-0.5 MG/3 ML NEB SOLUTION
2.5 mg-0.5 mg/3 ml | RESPIRATORY_TRACT | 2 refills | Status: AC
Start: 2021-03-07 — End: ?

## 2021-03-07 NOTE — Telephone Encounter (Signed)
Venice at [facility] that Qvar and Advair are basically the same. Wants to know if the patient need to be on both?

## 2021-03-12 ENCOUNTER — Encounter: Attending: Student in an Organized Health Care Education/Training Program | Primary: Internal Medicine

## 2021-03-12 NOTE — Telephone Encounter (Signed)
Spoke with Marchelle Folks at [facility]. She will dispense the inhaler that is cheapest for the patient.

## 2021-03-18 NOTE — Telephone Encounter (Signed)
-----   Message from Roosvelt Harps sent at 03/15/2021  4:50 PM EDT -----  Subject: Referral Request    QUESTIONS   Reason for referral request? pain management   Has the physician seen you for this condition before? Yes  Select a date? 2021-03-06  Select the Provider the patient wants to be referred to, if known (PCP or   Specialist)? [place] J Grnak   Preferred Specialist (if applicable)?   Do you already have an appointment scheduled? No  Additional Information for Provider? Dr Christell Constant does not accept her   insurance, would like another provider please  ---------------------------------------------------------------------------  --------------  CALL BACK INFO  What is the best way for the office to contact you? OK to leave message on   voicemail  Preferred Call Back Phone Number? [phone]  ---------------------------------------------------------------------------  --------------  SCRIPT ANSWERS  Relationship to Patient? Self

## 2021-03-18 NOTE — Telephone Encounter (Signed)
Submitted referral to [facility] for Advanced Management of Pain.    Patient notified

## 2021-03-27 ENCOUNTER — Inpatient Hospital Stay: Admit: 2021-03-27 | Payer: MEDICARE | Attending: Internal Medicine | Primary: Internal Medicine

## 2021-03-27 DIAGNOSIS — F17200 Nicotine dependence, unspecified, uncomplicated: Secondary | ICD-10-CM

## 2021-03-27 DIAGNOSIS — Z1231 Encounter for screening mammogram for malignant neoplasm of breast: Secondary | ICD-10-CM

## 2021-04-04 ENCOUNTER — Encounter

## 2021-04-04 NOTE — Telephone Encounter (Signed)
Pt called needing refill sent in for [facility] to pharmacy.  Requested a return phone call with questions or concerns.

## 2021-04-08 ENCOUNTER — Encounter

## 2021-04-08 NOTE — Telephone Encounter (Signed)
Last OV 03/06/21 and Xanax last sent to [facility] on 03/06/21. Next appt is 04/17/21.    Pended a 10 d/s to last until appt since Dr. Quentin Ore is out office.

## 2021-04-09 MED ORDER — ALPRAZOLAM 0.25 MG TAB
0.25 mg | ORAL_TABLET | Freq: Two times a day (BID) | ORAL | 0 refills | Status: DC
Start: 2021-04-09 — End: 2021-04-17

## 2021-04-09 NOTE — Telephone Encounter (Signed)
Telephone Encounter by Ramond Dial B at 04/09/21 5537                Author: Ramond Dial B  Service: --  Author Type: Medical Assistant       Filed: 04/09/21 0843  Encounter Date: 04/08/2021  Status: Signed          Editor: Lequita Asal (Medical Assistant)          From: Mancel Bale: Elizabeth Ingram, DOSent: 04/08/2021  5:25 PM EDTSubject: MedicationI called Friday to get my refill like you told me it's my  Xanax I called the drug store they haven't got it yet can you get it forwarded to them thanks see you 11.

## 2021-04-09 NOTE — Telephone Encounter (Signed)
Requested medication ended 04/05/2021 and patient is requesting a refill.    Medication pended for review and approval.

## 2021-04-11 ENCOUNTER — Encounter: Payer: MEDICARE | Attending: Obstetrics & Gynecology | Primary: Internal Medicine

## 2021-04-17 ENCOUNTER — Ambulatory Visit: Attending: Internal Medicine | Primary: Internal Medicine

## 2021-04-17 ENCOUNTER — Ambulatory Visit: Admit: 2021-04-17 | Discharge: 2021-04-17 | Payer: MEDICARE | Attending: Internal Medicine | Primary: Internal Medicine

## 2021-04-17 DIAGNOSIS — K7460 Unspecified cirrhosis of liver: Secondary | ICD-10-CM

## 2021-04-17 MED ORDER — SHINGRIX ADJUVANT COMPONENT (PF) INTRAMUSCULAR SUSPENSION
Freq: Once | INTRAMUSCULAR | 0 refills | Status: AC
Start: 2021-04-17 — End: 2021-04-17

## 2021-04-17 MED ORDER — SPIRONOLACTONE 50 MG TAB
50 mg | ORAL_TABLET | Freq: Every day | ORAL | 2 refills | Status: AC
Start: 2021-04-17 — End: 2021-07-16

## 2021-04-17 MED ORDER — ALPRAZOLAM 0.25 MG TAB
0.25 mg | ORAL_TABLET | Freq: Two times a day (BID) | ORAL | 0 refills | Status: AC
Start: 2021-04-17 — End: 2021-05-07

## 2021-04-17 NOTE — Progress Notes (Signed)
Progress Notes by Anastacia Reinecke, [place] J, DO at 04/17/21 1320                Author: Cai Flott, [place] J, DO  Service: --  Author Type: Physician       Filed: 04/17/21 1336  Encounter Date: 04/17/2021  Status: Signed          Editor: Sharyl Panchal, [place] J, DO (Physician)                    [place] Jaydn Moscato, D.O.    [facility]   [address]   [place], [place] [address]   Tel: [phone]        Office Visit: Follow Up           Patient Name:  Elizabeth Ingram     DOB:   1961/02/26        MRN:    [id number]         Today's Date: 04/17/21 12:59 PM        Subjective        The patient is a 60 y.o. year old female with a pmh as listed below. She presents today for follow up for chronic liver disease, CKD,  obesity, hypertension, and anxiety/depression. Since her last visit she had her mammogram done and low dose CT of her chest, both of which showed no evidence of malignancy. She has her OB/GYN appointment on 04/22/2021 for establishing care and a pap smear.  She states she was taken off lisinopril/HCTZ due being switched to Spironolactone. She has an EGD next month. She will see her transplant doctor soon but does not know when it is exactly. She has not seen pain management yet.       Review of Systems    Constitutional: Negative.     HENT: Negative.     Respiratory: Negative.     Cardiovascular: Negative.     Gastrointestinal: Negative.     Genitourinary: Negative.     Musculoskeletal: Negative.     Skin: Negative.     Neurological: Negative.  Negative for dizziness.    Endo/Heme/Allergies: Negative for environmental allergies and polydipsia. Bruises/bleeds easily.    Psychiatric/Behavioral: Negative.        Past Medical History:      Diagnosis  Date      ?  Anxiety and depression  03/06/2021      ?  Chronic hepatitis C without hepatic coma (HCC)        ?  Cirrhosis of liver without ascites (HCC)  03/06/2021      ?  Obesity (BMI 30.0-34.9)  03/06/2021      ?  Primary hypertension  03/06/2021      ?  Stage 2  chronic kidney disease  03/06/2021      ?  Thrombocytopenia (HCC)  04/17/2021            Social History            Socioeconomic History      ?  Marital status:  SINGLE          Spouse name:  Not on file      ?  Number of children:  Not on file      ?  Years of education:  Not on file      ?  Highest education level:  Not on file      Occupational History      ?  Not on  file      Tobacco Use      ?  Smoking status:  Current Every Day Smoker          Packs/day:  0.50          Years:  40.00          Pack years:  20.00          Types:  Cigarettes      ?  Smokeless tobacco:  Never Used      Substance and Sexual Activity      ?  Alcohol use:  No      ?  Drug use:  No      ?  Sexual activity:  Yes      Other Topics  Concern      ?  Not on file      Social History Narrative      ?  Not on file            Social Determinants of Health            Financial Resource Strain:       ?  Difficulty of Paying Living Expenses: Not on file      Food Insecurity:       ?  Worried About Running Out of Food in the Last Year: Not on file      ?  Ran Out of Food in the Last Year: Not on file      Transportation Needs:       ?  Lack of Transportation (Medical): Not on file      ?  Lack of Transportation (Non-Medical): Not on file      Physical Activity:       ?  Days of Exercise per Week: Not on file      ?  Minutes of Exercise per Session: Not on file      Stress:       ?  Feeling of Stress : Not on file      Social Connections:       ?  Frequency of Communication with Friends and Family: Not on file      ?  Frequency of Social Gatherings with Friends and Family: Not on file      ?  Attends Religious Services: Not on file      ?  Active Member of Clubs or Organizations: Not on file      ?  Attends BankerClub or Organization Meetings: Not on file      ?  Marital Status: Not on file      Intimate Partner Violence:       ?  Fear of Current or Ex-Partner: Not on file      ?  Emotionally Abused: Not on file      ?  Physically Abused: Not on file      ?   Sexually Abused: Not on file      Housing Stability:       ?  Unable to Pay for Housing in the Last Year: Not on file      ?  Number of Places Lived in the Last Year: Not on file      ?  Unstable Housing in the Last Year: Not on file                    Current Outpatient Medications        Medication  Sig         ?  varicella-zoster (Shingrix Adjuvant Component-PF) injection  1 Each by IntraMUSCular route once for 1 dose.     ?  ALPRAZolam (XANAX) 0.25 mg tablet  Take 1 Tablet by mouth two (2) times a day. Max Daily Amount: 0.5 mg.     ?  albuterol (PROVENTIL HFA, VENTOLIN HFA, PROAIR HFA) 90 mcg/actuation inhaler  Take 1 Puff by inhalation every four (4) hours as needed for Wheezing.     ?  albuterol-ipratropium (DUO-NEB) 2.5 mg-0.5 mg/3 ml nebu  INHALE CONTENTS OF 1 VIAL VIA NEBULIZER DAILY AS NEEDED FOR WHEEZING     ?  amLODIPine (NORVASC) 10 mg tablet  amlodipine 10 mg tablet     ?  omeprazole (PRILOSEC) 40 mg capsule  Take 1 Capsule by mouth daily for 90 days.     ?  ondansetron hcl (ZOFRAN) 8 mg tablet  ondansetron HCl 8 mg tablet     ?  furosemide (LASIX) 20 mg tablet  Take 1 Tablet by mouth daily for 90 days.     ?  potassium chloride (K-DUR, KLOR-CON M20) 20 mEq tablet  take 1 tablet twice a day     ?  lactulose (CHRONULAC) 20 gram/30 mL soln solution  Take 30 mL by mouth nightly for 90 days.     ?  beclomethasone (Qvar) 40 mcg/actuation aero  Take 1 Puff by inhalation two (2) times a day.     ?  albuterol sulfate 2.5 mg/0.5 mL nebu 2.5 mg, ipratropium 0.02 % soln 0.5 mg  1 Dose by Nebulization route as needed for Wheezing.     ?  fluticasone propion-salmeteroL (ADVAIR/WIXELA) 250-50 mcg/dose diskus inhaler  fluticasone 250 mcg-salmeterol 50 mcg/dose blistr powdr for inhalation    INHALE 1 PUFF BY MOUTH TWICE DAILY     ?  citalopram (CELEXA) 40 mg tablet  citalopram 40 mg tablet    TAKE 1 TABLET BY MOUTH EVERY MORNING         ?  zinc sulfate (ZINCATE) 50 mg zinc (220 mg) capsule  zinc sulfate 50 mg zinc  (220 mg) capsule    TAKE ONE CAPSULE BY MOUTH TWICE DAILY WITH MEALS.          No current facility-administered medications for this visit.           Objective          Vitals:          04/17/21 1311        BP:  (!) 109/59     BP 1 Location:  Left upper arm     BP Patient Position:  Sitting     BP Cuff Size:  Adult long     Pulse:  63     Temp:  98 ??F (36.7 ??C)     TempSrc:  Temporal     Resp:  12     Height:  5\' 5"  (1.651 m)     Weight:  191 lb (86.6 kg)   Comment: with shoes        SpO2:  98%   Comment: Room air            Physical Exam:   Constitutional: Appears well kempt. Alert/oriented x3. In no acute distress.   Head: Normocephalic No trauma. No deformity. No bruits.    Neck: Supple. ROM normal. No tenderness. No masses.   Cardiac: Heart with normal rate/rhythm. No murmurs. No gallops. Pulses normal.  Skin: Small areas of bruises/ecchymosis on bilateral arms, hands, and on her chest   Pulmonary: Lungs clear to auscultation bilaterally. In no respiratory distress. No wheezing. No rales. No rhonci.    Psychiatric: Normal thought content. Normal behavior. Normal judgment.         Recommendations        Assessment:     Patient Active Problem List        Diagnosis  Code         ?  Primary hypertension  I10     ?  Anxiety and depression  F41.9, F32.A     ?  Obesity (BMI 30.0-34.9)  E66.9     ?  Cirrhosis of liver without ascites (HCC)  K74.60     ?  Stage 2 chronic kidney disease  N18.2         ?  Thrombocytopenia (HCC)  D69.6         Plan:   -continue current treatment plan    -follow up with OB/GYN   -refill xanax   -follow up with GI/transplant doctor as needed   -recommend patient discuss her low platelets with GI next month, I do think her thrombocytopenia is due to her advanced liver disease, will refer to Dr. Anola Gurney with hematology, she has seen him before; she will also call his office to try and expedite  the appointment    -The patient expresses understanding of the plan as I've explained it to her and  is in agreement  with the current plan.   -schedule AWV   -follow up in 6 months      Time Spent in Patient Room: 15 minutes   Time Spent Pre- and Post Reviewing patient's chart: 15 minutes   Total Time: 30 minutes      Signed: [place] Astria Jordahl, D.O.   04/17/21   12:59 PM

## 2021-04-17 NOTE — Telephone Encounter (Signed)
Patient called stating that she was told to call with the name of a medication that it could be called into the pharmacy.    spironolactone 50 mg is pended

## 2021-04-22 ENCOUNTER — Encounter: Payer: MEDICARE | Attending: Obstetrics & Gynecology | Primary: Internal Medicine

## 2021-04-23 ENCOUNTER — Encounter: Attending: Internal Medicine | Primary: Internal Medicine

## 2021-05-08 MED ORDER — ALBUTEROL SULFATE HFA 108 (90 BASE) MCG/ACT IN AERS
108 (90 Base) MCG/ACT | RESPIRATORY_TRACT | 2 refills | Status: DC
Start: 2021-05-08 — End: 2021-05-20

## 2021-05-13 NOTE — Telephone Encounter (Signed)
[facility] called re: a refill on her Albuterol and Xanax.  She states the pharmacy has already called once, but she has not heard anything back.  She does have an appt tomorrow, but she states she does not have her daily dose for today.  She would like a call back to see if this can be sent in.   Pt CB# [phone]

## 2021-05-13 NOTE — Telephone Encounter (Signed)
Medication request will be addressed at patient upcoming appointment.

## 2021-05-14 ENCOUNTER — Encounter: Payer: MEDICARE | Attending: Internal Medicine | Primary: Internal Medicine

## 2021-05-14 NOTE — Telephone Encounter (Signed)
-----   Message from Elberta Fortis sent at 05/14/2021  9:13 AM EDT -----  Subject: Message to Provider    QUESTIONS  Information for Provider? Regarding the last message Pt missed her appt   today, reschedule for Monday, will be out of meds. Please call pt  ---------------------------------------------------------------------------  --------------  CALL BACK INFO  What is the best way for the office to contact you? OK to leave message on   voicemail  Preferred Call Back Phone Number? [phone]  ---------------------------------------------------------------------------  --------------  SCRIPT ANSWERS  Relationship to Patient? Self

## 2021-05-14 NOTE — Telephone Encounter (Signed)
Attempt to notify patient of missed appointment with Dr.Jordan Quentin Ore was unsuccessful, voicemail was not made as phone number is out of service at this time.

## 2021-05-15 ENCOUNTER — Encounter

## 2021-05-15 MED ORDER — ALPRAZOLAM 0.25 MG PO TABS
0.25 MG | ORAL_TABLET | Freq: Every evening | ORAL | 0 refills | Status: DC | PRN
Start: 2021-05-15 — End: 2021-05-20

## 2021-05-15 NOTE — Telephone Encounter (Signed)
As of last office note, patient is still taking this medication and will be out of medication by their appointment. Pended medication has correct quantity to ensure patient has enough medication until her next appointment and pended to preferred pharmacy.

## 2021-05-15 NOTE — Telephone Encounter (Signed)
Patient called requesting a refill on  ALPRAZolam (XANAX) 0.25 mg tablet   She missed appointment yesterday, rescheduled for the 13th.     [facility] on [address]

## 2021-05-20 ENCOUNTER — Encounter

## 2021-05-20 ENCOUNTER — Ambulatory Visit: Admit: 2021-05-20 | Discharge: 2021-05-20 | Payer: MEDICARE | Attending: Internal Medicine | Primary: Internal Medicine

## 2021-05-20 DIAGNOSIS — H547 Unspecified visual loss: Secondary | ICD-10-CM

## 2021-05-20 MED ORDER — IPRATROPIUM-ALBUTEROL 0.5-2.5 (3) MG/3ML IN SOLN
Freq: Four times a day (QID) | RESPIRATORY_TRACT | 0 refills | Status: DC | PRN
Start: 2021-05-20 — End: 2021-08-22

## 2021-05-20 MED ORDER — OMEPRAZOLE 40 MG PO CPDR
40 MG | ORAL_CAPSULE | Freq: Every day | ORAL | 2 refills | Status: DC
Start: 2021-05-20 — End: 2022-04-08

## 2021-05-20 MED ORDER — CITALOPRAM HYDROBROMIDE 40 MG PO TABS
40 MG | ORAL_TABLET | Freq: Every day | ORAL | 0 refills | Status: DC
Start: 2021-05-20 — End: 2021-06-25

## 2021-05-20 MED ORDER — ALPRAZOLAM 0.25 MG PO TABS
0.25 MG | ORAL_TABLET | Freq: Every evening | ORAL | 0 refills | Status: DC | PRN
Start: 2021-05-20 — End: 2021-06-20

## 2021-05-20 MED ORDER — ALBUTEROL SULFATE HFA 108 (90 BASE) MCG/ACT IN AERS
10890 (90 Base) MCG/ACT | RESPIRATORY_TRACT | 2 refills | Status: AC
Start: 2021-05-20 — End: 2021-10-10

## 2021-05-20 MED ORDER — AMLODIPINE BESYLATE 10 MG PO TABS
10 MG | ORAL_TABLET | Freq: Every day | ORAL | 0 refills | Status: DC
Start: 2021-05-20 — End: 2021-06-25

## 2021-05-20 MED ORDER — FUROSEMIDE 20 MG PO TABS
20 MG | ORAL_TABLET | Freq: Every day | ORAL | 2 refills | Status: DC
Start: 2021-05-20 — End: 2022-03-04

## 2021-05-20 MED ORDER — ONDANSETRON HCL 8 MG PO TABS
8 MG | ORAL_TABLET | Freq: Three times a day (TID) | ORAL | 0 refills | Status: AC | PRN
Start: 2021-05-20 — End: 2022-09-05

## 2021-05-20 NOTE — Telephone Encounter (Signed)
Pharmacy called concerning prescription for patient. It was for the Nebulizer solution. The instructions have two different instructions. One says 1 every 6 hours and the other says 1 daily. Which is correct?    [facility]  [address]

## 2021-05-20 NOTE — Patient Instructions (Signed)
Body Mass Index: Care Instructions  Your Care Instructions     Body mass index (BMI) can help you see if your weight is raising your risk for health problems. It uses a formula to compare how much you weigh with how tallyou are.  ??? A BMI lower than 18.5 is considered underweight.  ??? A BMI between 18.5 and 24.9 is considered healthy.  ??? A BMI between 25 and 29.9 is considered overweight. A BMI of 30 or higher is considered obese.  If your BMI is in the normal range, it means that you have a lower risk for weight-related health problems. If your BMI is in the overweight or obese range, you may be at increased risk for weight-related health problems, such as high blood pressure, heart disease, stroke, arthritis or joint pain, and diabetes. If your BMI is in the underweight range, you may be at increased risk for health problems such as fatigue, lower protection (immunity) againstillness, muscle loss, bone loss, hair loss, and hormone problems.  BMI is just one measure of your risk for weight-related health problems. You may be at higher risk for health problems if you are not active, you eat anunhealthy diet, or you drink too much alcohol or use tobacco products.  Follow-up care is a key part of your treatment and safety. Be sure to make and go to all appointments, and call your doctor if you are having problems. It's also a good idea to know your test results and keep alist of the medicines you take.  How can you care for yourself at home?  ??? Practice healthy eating habits. This includes eating plenty of fruits, vegetables, whole grains, lean protein, and low-fat dairy.  ??? If your doctor recommends it, get more exercise. Walking is a good choice. Bit by bit, increase the amount you walk every day. Try for at least 30 minutes on most days of the week.  ??? Do not smoke. Smoking can increase your risk for health problems. If you need help quitting, talk to your doctor about stop-smoking programs and medicines.  These can increase your chances of quitting for good.  ??? Limit alcohol to 2 drinks a day for men and 1 drink a day for women. Too much alcohol can cause health problems.  If you have a BMI higher than 25  ??? Your doctor may do other tests to check your risk for weight-related health problems. This may include measuring the distance around your waist. A waist measurement of more than 40 inches in men or 35 inches in women can increase the risk of weight-related health problems.  ??? Talk with your doctor about steps you can take to stay healthy or improve your health. You may need to make lifestyle changes to lose weight and stay healthy, such as changing your diet and getting regular exercise.  If you have a BMI lower than 18.5  ??? Your doctor may do other tests to check your risk for health problems.  ??? Talk with your doctor about steps you can take to stay healthy or improve your health. You may need to make lifestyle changes to gain or maintain weight and stay healthy, such as getting more healthy foods in your diet and doing exercises to build muscle.  Where can you learn more?  Go to [email or web address] and sign in to your MyChart account. Enter S176 in the [place] box to learn more about "Body Mass Index: Care Instructions."     If you  do not have an account, please click on the "Sign Up Now" link.  Current as of: December 03, 2020??????????????????????????????Content Version: 13.2  ?? 2006-2022 [employer].   Care instructions adapted under license by [facility]. If you have questions about a medical condition or this instruction, always ask your healthcare professional. Health[employer], Inc[employer] disclaims any warranty or liability for your use of this information.           Eating Healthy Foods: Care Instructions  Your Care Instructions     Eating healthy foods can help lower your risk for disease. Healthy food gives you energy and keeps your heart strong, your brain active,  your musclesworking, and your bones strong.  A healthy diet includes a variety of foods from the basic food groups: grains, vegetables, fruits, milk and milk products, and meat and beans. Some people may eat more of their favorite foods from only one food group and, as a result, miss getting the nutrients they need. So, it is important to pay attention not only to what you eat but also to what you are missing from your diet. You caneat a healthy, balanced diet by making a few small changes.  Follow-up care is a key part of your treatment and safety. Be sure to make and go to all appointments, and call your doctor if you are having problems. It's also a good idea to know your test results and keep alist of the medicines you take.  How can you care for yourself at home?  Look at what you eat  ??? Keep a food diary for a week or two and record everything you eat or drink. Track the number of servings you eat from each food group.  ??? For a balanced diet every day, eat a variety of:  ? 6 or more ounce-equivalents of grains, such as cereals, breads, crackers, rice, or pasta, every day. An ounce-equivalent is 1 slice of bread, 1 cup of ready-to-eat cereal, or ?? cup of cooked rice, cooked pasta, or cooked cereal.  ? 2?? cups of vegetables, especially:  - Dark-green vegetables such as broccoli and spinach.  - Orange vegetables such as carrots and sweet potatoes.  - Dry beans (such as pinto and kidney beans) and peas (such as lentils).  ? 2 cups of fresh, frozen, or canned fruit. A small apple or 1 banana or orange equals 1 cup.  ? 3 cups of nonfat or low-fat milk, yogurt, or other milk products.  ? 5?? ounces of meat and beans, such as chicken, fish, lean meat, beans, nuts, and seeds. One egg, 1 tablespoon of peanut butter, ?? ounce nuts or seeds, or ?? cup of cooked beans equals 1 ounce of meat.  ??? Learn how to read food labels for serving sizes and ingredients. Fast-food and convenience-food meals often contain few or no fruits  or vegetables. Make sure you eat some fruits and vegetables to make the meal more nutritious.  ??? Look at your food diary. For each food group, add up what you have eaten and then divide the total by the number of days. This will give you an idea of how much you are eating from each food group. See if you can find some ways to change your diet to make it more healthy.  Start small  ??? Do not try to make dramatic changes to your diet all at once. You might feel that you are missing out on your favorite foods and then be more likely to   fail.  ??? Start slowly, and gradually change your habits. Try some of the following:  ? Use whole wheat bread instead of white bread.  ? Use nonfat or low-fat milk instead of whole milk.  ? Eat brown rice instead of white rice, and eat whole wheat pasta instead of white-flour pasta.  ? Try low-fat cheeses and low-fat yogurt.  ? Add more fruits and vegetables to meals and have them for snacks.  ? Add lettuce, tomato, cucumber, and onion to sandwiches.  ? Add fruit to yogurt and cereal.  Enjoy food  ??? You can still eat your favorite foods. You just may need to eat less of them. If your favorite foods are high in fat, salt, and sugar, limit how often you eat them, but do not cut them out entirely.  ??? Eat a wide variety of foods.  Make healthy choices when eating out  ??? The type of restaurant you choose can help you make healthy choices. Even fast-food chains are now offering more low-fat or healthier choices on the menu.  ??? Choose smaller portions, or take half of your meal home.  ??? When eating out, try:  ? A veggie pizza with a whole wheat crust or grilled chicken (instead of sausage or pepperoni).  ? Pasta with roasted vegetables, grilled chicken, or marinara sauce instead of cream sauce.  ? A vegetable wrap or grilled chicken wrap.  ? Broiled or poached food instead of fried or breaded items.  Make healthy choices easy  ??? Buy packaged, prewashed, ready-to-eat fresh vegetables and fruits,  such as baby carrots, salad mixes, and chopped or shredded broccoli and cauliflower.  ??? Buy packaged, presliced fruits, such as melon or pineapple.  ??? Choose 100% fruit or vegetable juice instead of soda. Limit juice intake to 4 to 6 oz (?? to ?? cup) a day.  ??? Blend low-fat yogurt, fruit juice, and canned or frozen fruit to make a smoothie for breakfast or a snack.  Where can you learn more?  Go to [email or web address] and sign in to your MyChart account. Enter [phone] in the Search Health Information box to learn more about "Eating Healthy Foods: Care Instructions."     If you do not have an account, please click on the "Sign Up Now" link.  Current as of: August 15, 2020??????????????????????????????Content Version: 13.2  ?? 2006-2022 [employer], [employer].   Care instructions adapted under license by [facility]. If you have questions about a medical condition or this instruction, always ask your healthcare professional. Health[employer], Inc[employer] disclaims any warranty or liability for your use of this information.      Personalized Preventive Plan for SHIRLY BARTOSIEWICZ - 05/20/2021  Medicare offers a range of preventive health benefits. Some of the tests and screenings are paid in full while other may be subject to a deductible, co-insurance, and/or copay.    Some of these benefits include a comprehensive review of your medical history including lifestyle, illnesses that may run in your family, and various assessments and screenings as appropriate.    After reviewing your medical record and screening and assessments performed today your provider may have ordered immunizations, labs, imaging, and/or referrals for you.  A list of these orders (if applicable) as well as your Preventive Care list are included within your After Visit Summary for your review.    Other Preventive Recommendations:    ?? A preventive eye exam performed by an eye specialist is recommended every 1-2 years to screen for glaucoma;  cataracts,  macular degeneration, and other eye disorders.  ?? A preventive dental visit is recommended every 6 months.  ?? Try to get at least 150 minutes of exercise per week or 10,000 steps per day on a pedometer .  ?? Order or download the FREE "Exercise & Physical Activity: Your Everyday Guide" from [employer]. Call [phone] or search [employer] online.  ?? You need 1200-1500 mg of calcium and 1000-2000 IU of vitamin D per day. It is possible to meet your calcium requirement with diet alone, but a vitamin D supplement is usually necessary to meet this goal.  ?? When exposed to the sun, use a sunscreen that protects against both UVA and UVB radiation with an SPF of 30 or greater. Reapply every 2 to 3 hours or after sweating, drying off with a towel, or swimming.  ?? Always wear a seat belt when traveling in a car. Always wear a helmet when riding a bicycle or motorcycle.

## 2021-05-20 NOTE — Telephone Encounter (Signed)
Patient was seen in office today and refills were addressed.

## 2021-05-20 NOTE — Progress Notes (Signed)
Medicare Annual Wellness Visit    Elizabeth Ingram is here for Medicare AWV (SUB)    Assessment & Plan   Decreased visual acuity  -     citalopram (CELEXA) 40 MG tablet; Take 1 tablet by mouth daily Citalopram 40 mg tablet TAKE 1 TABLET BY MOUTH EVERY MORNING, Disp-30 tablet, R-0Normal  -     [facility] - [facility]  Anxiety and depression  -     ALPRAZolam (XANAX) 0.25 MG tablet; Take 1 tablet by mouth nightly as needed for Sleep for up to 30 days., Disp-30 tablet, R-0Normal  Primary hypertension  -     amLODIPine (NORVASC) 10 MG tablet; Take 1 tablet by mouth daily Amlodipine 10 mg tablet, Disp-30 tablet, R-0Normal  Stage 2 chronic kidney disease  -     furosemide (LASIX) 20 MG tablet; Take 1 tablet by mouth daily, Disp-30 tablet, R-2Normal  Gastroesophageal reflux disease without esophagitis  -     omeprazole (PRILOSEC) 40 MG delayed release capsule; Take 1 capsule by mouth daily, Disp-30 capsule, R-2Normal  Cirrhosis of liver without ascites, unspecified hepatic cirrhosis type (HCC)  -     furosemide (LASIX) 20 MG tablet; Take 1 tablet by mouth daily, Disp-30 tablet, R-2Normal  Shortness of breath  -     ipratropium-albuterol (DUONEB) 0.5-2.5 (3) MG/3ML SOLN nebulizer solution; Inhale 3 mLs into the lungs every 6 hours as needed for Shortness of Breath INHALE CONTENTS OF 1 VIAL VIA NEBULIZER DAILY AS NEEDED FOR WHEEZING, Disp-360 mL, R-0Normal  -     albuterol sulfate HFA (PROVENTIL;VENTOLIN;PROAIR) 108 (90 Base) MCG/ACT inhaler; INHALE 1 PUFF BY MOUTH EVERY 4 HOURS AS NEEDED FOR WHEEZING, Disp-8.5 g, R-2Normal  Nausea  -     ondansetron (ZOFRAN) 8 MG tablet; Take 1 tablet by mouth every 8 hours as needed for Nausea or Vomiting Ondansetron HCl 8 mg tablet, Disp-90 tablet, R-0Normal  Welcome to Medicare preventive visit      Recommendations for Preventive Services Due: see orders and patient instructions/AVS.  Recommended screening schedule for the next 5-10 years is provided to the patient in written  form: see Patient Instructions/AVS.     Return for Medicare Annual Wellness Visit in 1 year.     Subjective     Patient's complete Health Risk Assessment and screening values have been reviewed and are found in Flowsheets. The following problems were reviewed today and where indicated follow up appointments were made and/or referrals ordered.    Positive Risk Factor Screenings with Interventions:     Cognitive:  Words recalled: 0 Words Recalled  Clock Drawing Test (CDT): (!) Abnormal  Total Score Interpretation: Abnormal Mini-Cog  Did the patient refuse to take the cognition test?: No    Cognitive Impairment Interventions:  ?? Patient advised to follow-up in this office for further evaluation and treatment within 1 month(s)     Tobacco Use:     Tobacco Use: High Risk   ??? Smoking Tobacco Use: Current Every Day Smoker   ??? Smokeless Tobacco Use: Never Used     E-Cigarettes/Vaping Use     Questions Responses    E-Cigarette/Vaping Use     Start Date     Passive Exposure     Quit Date     Counseling Given     Comments         Substance Use - Tobacco Interventions:  patient is not ready to work toward tobacco cessation at this time  General Health and ACP:  General  In general, how would you say your health is?: Fair  In the past 7 days, have you experienced any of the following: New or Increased Pain, New or Increased Fatigue, Loneliness, Social Isolation, Stress or Anger?: (!) Yes  Select all that apply: (!) Social Isolation,Stress  Do you get the social and emotional support that you need?: Yes  Do you have a Living Will?: Yes    Advance Directives     Power of Attorney Living Will ACP-Advance Directive ACP-Power of Attorney    Not on File [place] on 03/14/17 Filed Not on File      General Health Risk Interventions:  ?? Social isolation: patient's comments regarding inadequate social support: she gets the support that she needs    Health Habits/Nutrition:     Physical Activity: Insufficiently Active   ??? Days of  Exercise per Week: 3 days   ??? Minutes of Exercise per Session: 20 min     Have you lost any weight without trying in the past 3 months?: No  Body mass index: (!) 30.28  Have you seen the dentist within the past year?: (!) No    Health Habits/Nutrition Interventions:  ?? Patient will address physical activity next visit.     Hearing/Vision:  Do you or your family notice any trouble with your hearing that hasn't been managed with hearing aids?: No  Do you have difficulty driving, watching TV, or doing any of your daily activities because of your eyesight?: (!) Yes  Have you had an eye exam within the past year?: (!) No   Visual Acuity Screening    Right eye Left eye Both eyes   Without correction: 20/100 20/50 20/50   With correction:        Hearing/Vision Interventions:  ?? Vision concerns:  ophthalmology/optometry referral provided            Objective   Vitals:    05/20/21 1341   BP: 139/71   Site: Left Upper Arm   Position: Sitting   Cuff Size: Large Adult   Pulse: 68   Resp: 12   Temp: 98.4 ??F (36.9 ??C)   TempSrc: Temporal   SpO2: 96%   Weight: 182 lb (82.6 kg)   Height: 5\' 5"  (1.651 m)      Body mass index is 30.29 kg/m??.        General Appearance: alert and oriented to person, place and time, well developed and well- nourished, in no acute distress  Skin: warm and dry, no rash or erythema  Head: normocephalic and atraumatic  Eyes: pupils equal, round, and reactive to light, extraocular eye movements intact, conjunctivae normal  ENT: tympanic membrane, external ear and ear canal normal bilaterally, nose without deformity, nasal mucosa and turbinates normal without polyps  Neck: supple and non-tender without mass, no thyromegaly or thyroid nodules, no cervical lymphadenopathy  Pulmonary/Chest: clear to auscultation bilaterally- no wheezes, rales or rhonchi, normal air movement, no respiratory distress  Cardiovascular: normal rate, regular rhythm, normal S1 and S2, no murmurs, rubs, clicks, or gallops, distal pulses  intact, no carotid bruits  Abdomen: soft, non-tender, non-distended, normal bowel sounds, no masses or organomegaly  Extremities: no cyanosis, clubbing or edema  Musculoskeletal: normal range of motion, no joint swelling, deformity or tenderness  Neurologic: reflexes normal and symmetric, no cranial nerve deficit, gait, coordination and speech normal       Allergies   Allergen Reactions   ??? Gabapentin Other (See  Comments)     Pt reports decreased respirations while taking this med     Prior to Visit Medications    Medication Sig Taking? Authorizing Provider   ALPRAZolam Prudy Feeler) 0.25 MG tablet Take 1 tablet by mouth nightly as needed for Sleep for up to 30 days. Yes [place] J Ikaika Showers, DO   amLODIPine (NORVASC) 10 MG tablet Take 1 tablet by mouth daily Amlodipine 10 mg tablet Yes [place] Damier Disano, DO   citalopram (CELEXA) 40 MG tablet Take 1 tablet by mouth daily Citalopram 40 mg tablet TAKE 1 TABLET BY MOUTH EVERY MORNING Yes [place] J Sterlin Knightly, DO   furosemide (LASIX) 20 MG tablet Take 1 tablet by mouth daily Yes [place] J Tiphany Fayson, DO   omeprazole (PRILOSEC) 40 MG delayed release capsule Take 1 capsule by mouth daily Yes [place] Hasini Peachey, DO   ipratropium-albuterol (DUONEB) 0.5-2.5 (3) MG/3ML SOLN nebulizer solution Inhale 3 mLs into the lungs every 6 hours as needed for Shortness of Breath INHALE CONTENTS OF 1 VIAL VIA NEBULIZER DAILY AS NEEDED FOR WHEEZING Yes [place] J Jairon Ripberger, DO   ondansetron (ZOFRAN) 8 MG tablet Take 1 tablet by mouth every 8 hours as needed for Nausea or Vomiting Ondansetron HCl 8 mg tablet Yes [place] J Daziah Hesler, DO   albuterol sulfate HFA (PROVENTIL;VENTOLIN;PROAIR) 108 (90 Base) MCG/ACT inhaler INHALE 1 PUFF BY MOUTH EVERY 4 HOURS AS NEEDED FOR WHEEZING Yes [place] J Danzig Macgregor, DO   fluticasone-salmeterol (ADVAIR HFA) 230-21 MCG/ACT inhaler Inhale 2 puffs into the lungs 2 times daily Yes Historical Provider, MD   lactulose (CHRONULAC) 10 GM/15ML solution Take 20 g by mouth Yes Ar Automatic Reconciliation   zinc  sulfate (ZINCATE) 220 (50 Zn) MG capsule Zinc sulfate 50 mg zinc (220 mg) capsule TAKE ONE CAPSULE BY MOUTH TWICE DAILY WITH MEALS. Yes Ar Automatic Reconciliation   nadolol (CORGARD) 20 MG tablet Take 20 mg by mouth daily  Ar Automatic Reconciliation       CareTeam (Including outside providers/suppliers regularly involved in providing care):   Patient Care Team:  [place] J Brytni Dray, DO as PCP - General  [place] J Geramy Lamorte, DO as PCP - [facility] Empaneled Provider     Reviewed and updated this visit:  Tobacco   Allergies   Meds   Problems   Med Hx   Surg Hx   Soc Hx   Fam Hx

## 2021-05-22 NOTE — Telephone Encounter (Signed)
[facility] called for clarification on a prescription that was sent in a few days ago, they can be called back at [phone].

## 2021-05-22 NOTE — Telephone Encounter (Signed)
Duplicate message

## 2021-05-22 NOTE — Telephone Encounter (Signed)
Spoke with Pharmacist and clarification for the DUONEB was given.

## 2021-05-22 NOTE — Telephone Encounter (Signed)
[facility] called stating that patient no showed her 05/16/21 appt and three other ones along with cancellation as well. She has never been seen in their office and they have closed the referral.

## 2021-05-28 ENCOUNTER — Ambulatory Visit
Admit: 2021-05-28 | Discharge: 2021-05-28 | Payer: MEDICARE | Attending: Obstetrics & Gynecology | Primary: Internal Medicine

## 2021-05-28 DIAGNOSIS — Z01419 Encounter for gynecological examination (general) (routine) without abnormal findings: Secondary | ICD-10-CM

## 2021-05-28 NOTE — Addendum Note (Signed)
Addended by: Ladoris Gene A on: 05/28/2021 03:07 PM     Modules accepted: Orders

## 2021-05-28 NOTE — Progress Notes (Signed)
CC:  Annual GYN exam    HPI:  60 y.o.  No obstetric history on file. presents today for a routine gynecological examination.  No LMP recorded. Patient is postmenopausal..      PCP: yes. Being managed for multiple medical problems, CKD, HTN, cirrhosis (hep C), HTN, current smoker     Contraception:  S/p BTL    Menses:  Postmenopausal     Sexually active w/ female partner   Desires STD testing  Mammogram earlier this year and normal (April 2022)      Ob hx:  G1P1    GYN HISTORY:  As per HPI     Last Pap:  2-3 years ago   Hx of Abnl Paps: yes     Hx STDs: yes   Gardasil vaccine: unsure     OB History     Gravida   1    Para   1    Term   1    Preterm        AB        Living   1       SAB        IAB        Ectopic        Molar        Multiple        Live Births                    Past Medical History:   Diagnosis Date   ??? Abnormal Pap smear of cervix    ??? Anxiety and depression 03/06/2021   ??? Chronic hepatitis C without hepatic coma (HCC)    ??? Cirrhosis of liver without ascites (HCC) 03/06/2021   ??? Obesity (BMI 30.0-34.9) 03/06/2021   ??? Primary hypertension 03/06/2021   ??? Stage 2 chronic kidney disease 03/06/2021   ??? Stage 2 chronic kidney disease    ??? Thrombocytopenia (HCC) 04/17/2021         Past Surgical History:   Procedure Laterality Date   ??? NEUROLOGICAL SURGERY      nerve in left arm surgery 2009   ??? ORTHOPEDIC SURGERY      x2 back    ??? TUBAL LIGATION           Outpatient Encounter Medications as of 05/28/2021   Medication Sig Dispense Refill   ??? ALPRAZolam (XANAX) 0.25 MG tablet Take 1 tablet by mouth nightly as needed for Sleep for up to 30 days. 30 tablet 0   ??? amLODIPine (NORVASC) 10 MG tablet Take 1 tablet by mouth daily Amlodipine 10 mg tablet 30 tablet 0   ??? citalopram (CELEXA) 40 MG tablet Take 1 tablet by mouth daily Citalopram 40 mg tablet TAKE 1 TABLET BY MOUTH EVERY MORNING 30 tablet 0   ??? furosemide (LASIX) 20 MG tablet Take 1 tablet by mouth daily 30 tablet 2   ??? omeprazole (PRILOSEC) 40 MG delayed release  capsule Take 1 capsule by mouth daily 30 capsule 2   ??? ipratropium-albuterol (DUONEB) 0.5-2.5 (3) MG/3ML SOLN nebulizer solution Inhale 3 mLs into the lungs every 6 hours as needed for Shortness of Breath INHALE CONTENTS OF 1 VIAL VIA NEBULIZER DAILY AS NEEDED FOR WHEEZING 360 mL 0   ??? ondansetron (ZOFRAN) 8 MG tablet Take 1 tablet by mouth every 8 hours as needed for Nausea or Vomiting Ondansetron HCl 8 mg tablet 90 tablet 0   ??? albuterol sulfate HFA (PROVENTIL;VENTOLIN;PROAIR) 108 (90  Base) MCG/ACT inhaler INHALE 1 PUFF BY MOUTH EVERY 4 HOURS AS NEEDED FOR WHEEZING 8.5 g 2   ??? fluticasone-salmeterol (ADVAIR HFA) 230-21 MCG/ACT inhaler Inhale 2 puffs into the lungs 2 times daily     ??? lactulose (CHRONULAC) 10 GM/15ML solution Take 20 g by mouth     ??? zinc sulfate (ZINCATE) 220 (50 Zn) MG capsule Zinc sulfate 50 mg zinc (220 mg) capsule TAKE ONE CAPSULE BY MOUTH TWICE DAILY WITH MEALS.     ??? nadolol (CORGARD) 20 MG tablet Take 20 mg by mouth daily       No facility-administered encounter medications on file as of 05/28/2021.         Allergies   Allergen Reactions   ??? Gabapentin Other (See Comments)     Pt reports decreased respirations while taking this med         Family History   Problem Relation Age of Onset   ??? Colon Cancer Neg Hx    ??? Breast Cancer Neg Hx    ??? Ovarian Cancer Neg Hx          Social History     Socioeconomic History   ??? Marital status: Single     Spouse name: None   ??? Number of children: None   ??? Years of education: None   ??? Highest education level: None   Occupational History   ??? None   Tobacco Use   ??? Smoking status: Current Every Day Smoker     Packs/day: 0.50     Years: 40.00     Pack years: 20.00   ??? Smokeless tobacco: Never Used   Substance and Sexual Activity   ??? Alcohol use: No   ??? Drug use: No   ??? Sexual activity: Yes     Partners: Male   Other Topics Concern   ??? None   Social History Narrative   ??? None     Social Determinants of Health     Financial Resource Strain:    ??? Difficulty of  Paying Living Expenses: Not on file   Food Insecurity:    ??? Worried About Programme researcher, broadcasting/film/video in the Last Year: Not on file   ??? Ran Out of Food in the Last Year: Not on file   Transportation Needs:    ??? Lack of Transportation (Medical): Not on file   ??? Lack of Transportation (Non-Medical): Not on file   Physical Activity: Insufficiently Active   ??? Days of Exercise per Week: 3 days   ??? Minutes of Exercise per Session: 20 min   Stress:    ??? Feeling of Stress : Not on file   Social Connections:    ??? Frequency of Communication with Friends and Family: Not on file   ??? Frequency of Social Gatherings with Friends and Family: Not on file   ??? Attends Religious Services: Not on file   ??? Active Member of Clubs or Organizations: Not on file   ??? Attends Banker Meetings: Not on file   ??? Marital Status: Not on file   Intimate Partner Violence:    ??? Fear of Current or Ex-Partner: Not on file   ??? Emotionally Abused: Not on file   ??? Physically Abused: Not on file   ??? Sexually Abused: Not on file   Housing Stability:    ??? Unable to Pay for Housing in the Last Year: Not on file   ??? Number of Places Lived in the  Last Year: Not on file   ??? Unstable Housing in the Last Year: Not on file           ROS:  Constitutional: Negative for chills, fever and weight loss.   HENT: Negative for hearing loss.   Eyes: Negative for blurred vision and double vision.   Respiratory: expiratory wheezing present    Cardiovascular: Negative for chest pain, palpitations and orthopnea.   Gastrointestinal: Negative for abdominal pain, blood in stool, constipation, diarrhea, nausea and vomiting.   Genitourinary: Negative for dysuria, frequency, hematuria and urgency.   Musculoskeletal: Negative for falls, joint pain and myalgias.   Skin: bruising on arm.   Neurological: Negative for headaches.   Endo/Heme/Allergies: Does not bruise/bleed easily.   Psychiatric/Behavioral: Negative for depression and suicidal ideas. The patient is not nervous/anxious.    All other systems reviewed and are negative.       PHYSICAL EXAM:  Blood pressure 130/86, height 5\' 5"  (1.651 m), weight 182 lb (82.6 kg).    Constitutional: She appears well-developed and well-nourished. No distress.   HENT:    Head: Normocephalic and atraumatic.   Neck: Normal range of motion. No thyromegaly present.   Cardiovascular: Normal rate, regular rhythm and normal heart sounds. Exam reveals no gallop and no friction rub.    No murmur heard.  Pulmonary/Chest: Effort normal and breath sounds normal. No respiratory distress. She has no wheezes. She has no rales.   Abdominal: Soft. Bowel sounds are normal. She exhibits no distension and no mass. There is no tenderness. There is no rebound and no guarding.   Skin: She is not diaphoretic.   Psychiatric: She has a normal mood and affect. Her behavior is normal. Thought content normal. .    Pelvic:   External genitalia wnl, no lesions, rashes  Clitoris and urethra midline  Vagina pink, moist, well rugated  Cervix without lesion/masses, DC wnl  Uterus normal in size and contour, no masses, NTTP  Adnexa without masses, NTTP    Breasts:   Symmetric, no lesions, masses, rashes, no abnl nipple Dc       Counseling:  Discussed General Recommendations:  -Routine Pap (unless cervix removed for benign reasons)  -STD screening annually pts </= 59yo or high risk   -lipid profile every 5 yrs  -Tdap once and then Td every 58yrs  -Influenza Vaccine, annually  -Healthy eating/exercise     ASSESSMENT/PLAN:   60 y.o., No obstetric history on file., for annual GYN exam:    1) Annual:   -Cotesting today    - STD testing    -Rx none    -safe sexual practices    -FU w/ PCP for all non-GYN medical issues and regular screening     -annual Flu vaccine recommended    -healthy eating, exercise    - advised to call if every any postmenopausal bleeding        Elizabeth Ingram L Carolann Brazell, DO

## 2021-06-06 LAB — PAP IG, CT-NG-TV, APTIMA HPV AND RFX 16/18,45 (199315)
Chlamydia trachomatis, RNA: NEGATIVE
HPV Aptima: NEGATIVE
Neisseria gonorrhoeae, RNA: NEGATIVE
TRICHOMONAS VAGINALIS rRNA: NEGATIVE

## 2021-06-13 MED ORDER — NADOLOL 20 MG PO TABS
20 MG | ORAL_TABLET | ORAL | 0 refills | Status: DC
Start: 2021-06-13 — End: 2021-09-09

## 2021-06-13 NOTE — Telephone Encounter (Signed)
As of last office note, patient is still taking this medication and will be out of medication by their appointment. Pended medication has correct quantity and pended to preferred pharmacy.

## 2021-06-19 ENCOUNTER — Encounter

## 2021-06-19 NOTE — Telephone Encounter (Signed)
-----   Message from Stephanie McIntosh sent at 06/19/2021 12:47 PM EDT -----  Subject: Refill Request    QUESTIONS  Name of Medication? ALPRAZolam (XANAX) 0.25 MG tablet  Patient-reported dosage and instructions? 0.25MG Once a day  How many days do you have left? 0  Preferred Pharmacy? [id number]  Pharmacy phone number (if available)? [phone]  Additional Information for Provider? Please call patient to confirm.   ---------------------------------------------------------------------------  --------------  CALL BACK INFO  What is the best way for the office to contact you? OK to leave message on   voicemail  Preferred Call Back Phone Number? [phone]  ---------------------------------------------------------------------------  --------------  SCRIPT ANSWERS  Relationship to Patient? Self

## 2021-06-20 MED ORDER — ALPRAZOLAM 0.25 MG PO TABS
0.25 MG | ORAL_TABLET | Freq: Every evening | ORAL | 0 refills | Status: DC | PRN
Start: 2021-06-20 — End: 2021-06-25

## 2021-06-20 NOTE — Telephone Encounter (Signed)
Patient called and rescheduled her 06/19/2021 appointment until 06/25/2021. Patient is requesting a refill of Xanax 0.25 mg.    Pended medication will sustain patient until 06/25/2021 appointment.    Pended pharmacy is correct.

## 2021-06-21 NOTE — Telephone Encounter (Signed)
Duplicate request... please reference previous encounter for detailed information.    Encounter will be closed.

## 2021-06-21 NOTE — Telephone Encounter (Signed)
-----   Message from Pleas Patricia sent at 06/19/2021 12:47 PM EDT -----  Subject: Refill Request    QUESTIONS  Name of Medication? ALPRAZolam (XANAX) 0.25 MG tablet  Patient-reported dosage and instructions? 0.25MG  Once a day  How many days do you have left? 0  Preferred Pharmacy? [facility] [phone]  Pharmacy phone number (if available)? [phone]  Additional Information for Provider? Please call patient to confirm.   ---------------------------------------------------------------------------  --------------  [name] INFO  What is the best way for the office to contact you? OK to leave message on   voicemail  Preferred Call Back Phone Number? [phone]  ---------------------------------------------------------------------------  --------------  SCRIPT ANSWERS  Relationship to Patient? Self

## 2021-06-25 ENCOUNTER — Ambulatory Visit: Admit: 2021-06-25 | Discharge: 2021-06-25 | Payer: MEDICARE | Attending: Internal Medicine | Primary: Internal Medicine

## 2021-06-25 ENCOUNTER — Encounter

## 2021-06-25 DIAGNOSIS — K7460 Unspecified cirrhosis of liver: Secondary | ICD-10-CM

## 2021-06-25 MED ORDER — ALPRAZOLAM 0.25 MG PO TABS
0.25 MG | ORAL_TABLET | Freq: Two times a day (BID) | ORAL | 0 refills | Status: DC
Start: 2021-06-25 — End: 2021-07-25

## 2021-06-25 MED ORDER — AMLODIPINE BESYLATE 10 MG PO TABS
10 MG | ORAL_TABLET | Freq: Every day | ORAL | 0 refills | Status: AC
Start: 2021-06-25 — End: 2021-09-09

## 2021-06-25 MED ORDER — CITALOPRAM HYDROBROMIDE 40 MG PO TABS
40 MG | ORAL_TABLET | Freq: Every day | ORAL | 0 refills | Status: DC
Start: 2021-06-25 — End: 2021-10-08

## 2021-06-25 NOTE — Progress Notes (Signed)
Elizabeth Ingram, D.O.   [facility]  [address]  [place], [place] [address]  Tel: [phone]    Office Visit: Follow Up     Patient Name: Elizabeth Ingram   DOB:  [birth year]   MRN:   [id number]      Today's Date: 06/25/21 1:51 PM    Subjective     The patient is a 60 y.o.-year-old female who presents for follow-up.  She recently had her OB/GYN appointment for her annual gynecological examination and Pap smear with cotesting.  She also recently underwent EGD by gastroenterology. She states they told her everything looked good. She has a follow up with GI in one year. She has not seen hematology yet. She states her gastric ulcer is gone. She has an appointment with Dr. Jinny Blossom in October.     Last visit:  -continue current treatment plan   -follow up with OB/GYN   -refill xanax   -follow up with GI/transplant doctor as needed   -recommend patient discuss her low platelets with GI next month, I do think her thrombocytopenia is due to her advanced liver disease, will refer to Dr. Anola Gurney with hematology, she has seen him before; she will also call his office to try and expedite  the appointment    Today: She is doing well with no new complaints.     Review of Systems   Constitutional: Negative.    HENT: Negative.     Eyes: Negative.    Respiratory: Negative.     Cardiovascular: Negative.    Gastrointestinal: Negative.    Genitourinary: Negative.    Musculoskeletal:  Positive for back pain.   Skin: Negative.    Allergic/Immunologic: Negative.    Neurological: Negative.    Psychiatric/Behavioral: Negative.        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)     Cirrhosis of liver without ascites (HCC) 03/06/2021    Obesity (BMI 30.0-34.9) 03/06/2021    Primary hypertension 03/06/2021    Stage 2 chronic kidney disease 03/06/2021    Stage 2 chronic kidney disease     Thrombocytopenia (HCC)  04/17/2021     Social History     Socioeconomic History    Marital status: Single     Spouse name: Not on file    Number of children: Not on file    Years of education: Not on file    Highest education level: Not on file   Occupational History    Not on file   Tobacco Use    Smoking status: Every Day     Packs/day: 0.50     Years: 40.00     Pack years: 20.00     Types: Cigarettes    Smokeless tobacco: Never   Substance and Sexual Activity    Alcohol use: No    Drug use: No    Sexual activity: Yes     Partners: Male   Other Topics Concern    Not on file   Social History Narrative    Not on file     Social Determinants of Health     Financial Resource Strain: Not on file   Food Insecurity: Not on file   Transportation Needs: Not on file   Physical Activity: Insufficiently Active    Days of Exercise per Week: 3 days  Minutes of Exercise per Session: 20 min   Stress: Not on file   Social Connections: Not on file   Intimate Partner Violence: Not on file   Housing Stability: Not on file         Current Outpatient Medications   Medication Sig    ALPRAZolam (XANAX) 0.25 MG tablet Take 1 tablet by mouth nightly as needed for Sleep for up to 30 days.    nadolol (CORGARD) 20 MG tablet TAKE 1 TABLET BY MOUTH DAILY    amLODIPine (NORVASC) 10 MG tablet Take 1 tablet by mouth daily Amlodipine 10 mg tablet    citalopram (CELEXA) 40 MG tablet Take 1 tablet by mouth daily Citalopram 40 mg tablet TAKE 1 TABLET BY MOUTH EVERY MORNING    furosemide (LASIX) 20 MG tablet Take 1 tablet by mouth daily    omeprazole (PRILOSEC) 40 MG delayed release capsule Take 1 capsule by mouth daily    ipratropium-albuterol (DUONEB) 0.5-2.5 (3) MG/3ML SOLN nebulizer solution Inhale 3 mLs into the lungs every 6 hours as needed for Shortness of Breath INHALE CONTENTS OF 1 VIAL VIA NEBULIZER DAILY AS NEEDED FOR WHEEZING    ondansetron (ZOFRAN) 8 MG tablet Take 1 tablet by mouth every 8 hours as needed for Nausea or Vomiting Ondansetron HCl 8 mg tablet     albuterol sulfate HFA (PROVENTIL;VENTOLIN;PROAIR) 108 (90 Base) MCG/ACT inhaler INHALE 1 PUFF BY MOUTH EVERY 4 HOURS AS NEEDED FOR WHEEZING    fluticasone-salmeterol (ADVAIR HFA) 230-21 MCG/ACT inhaler Inhale 2 puffs into the lungs 2 times daily    zinc sulfate (ZINCATE) 220 (50 Zn) MG capsule Zinc sulfate 50 mg zinc (220 mg) capsule TAKE ONE CAPSULE BY MOUTH TWICE DAILY WITH MEALS.     No current facility-administered medications for this visit.        Objective     Vitals:    06/25/21 1333   BP: 119/71   Site: Left Upper Arm   Position: Sitting   Cuff Size: Large Adult   Pulse: 72   Resp: 12   Temp: 98.1 ??F (36.7 ??C)   TempSrc: Temporal   SpO2: 92%   Weight: 188 lb (85.3 kg)   Height: 5\' 5"  (1.651 m)      Physical Exam:  Constitutional: Appears well kempt. Alert/oriented x3. In no acute distress.  Head: Normocephalic No trauma. No deformity. No bruits.   Neck: Supple. ROM normal. No tenderness. No masses.  Cardiac: Heart with normal rate/rhythm. No murmurs. No gallops. Pulses normal.   Pulmonary: Lungs clear to auscultation bilaterally. In no respiratory distress. No wheezing. No rales. No rhonci.   Psychiatric: Normal thought content. Normal behavior. Normal judgment.     Recommendations     Assessment:  Patient Active Problem List   Diagnosis    Primary hypertension    Anxiety and depression    Obesity (BMI 30.0-34.9)    Cirrhosis of liver without ascites (HCC)    Stage 2 chronic kidney disease    Thrombocytopenia (HCC)    Shortness of breath    Nausea    Gastroesophageal reflux disease without esophagitis    Decreased visual acuity        Plan:  -refill meds  -continue current treatment plan as needed  -She will make an appointment with Dr. 04-11-1993 for hematology concerning her platelets  -She will follow-up with gastroenterology as needed  -She will follow-up with pain management as needed  -Follow-up with ophthalmology in October  -check liver panel  -  check platelets  -The patient expresses understanding of the  plan as I've explained it to her and is in agreement with the current plan.    Follow Up: 3 months    Time Spent in Patient Room: 15 minutes  Time Spent Pre- and Post Reviewing patient's chart: 15 minutes  Total Time: 30 minutes    Signed: SwazilandJordan Mieko Kneebone, D.O.  06/25/21  1:51 PM

## 2021-06-26 ENCOUNTER — Telehealth

## 2021-06-26 LAB — HEPATIC FUNCTION PANEL
ALT: 13 U/L (ref 12–65)
AST: 29 U/L (ref 15–37)
Albumin/Globulin Ratio: 0.9 — ABNORMAL LOW (ref 1.2–3.5)
Albumin: 3.6 g/dL (ref 3.5–5.0)
Alk Phosphatase: 160 U/L — ABNORMAL HIGH (ref 50–136)
Bilirubin, Direct: 0.2 MG/DL (ref <0.4)
Globulin: 4.1 g/dL — ABNORMAL HIGH (ref 2.3–3.5)
Total Bilirubin: 0.5 MG/DL (ref 0.2–1.1)
Total Protein: 7.7 g/dL (ref 6.3–8.2)

## 2021-06-26 LAB — PLATELET COUNT: Platelets: 95 10*3/uL — ABNORMAL LOW (ref 150–450)

## 2021-06-26 NOTE — Telephone Encounter (Signed)
Patient would like to be referred to Dr. Guinevere Ferrari with [facility] for Hematology and Oncology referral.    Referral pended and awaiting review and completion.

## 2021-06-26 NOTE — Telephone Encounter (Signed)
Patient called to request a referral to Dr. Macy Mis, she was seen at that office too many years ago.

## 2021-07-04 NOTE — Telephone Encounter (Signed)
Patient is requesting a refill on   fluticasone-salmeterol (ADVAIR HFA) 230-21 MCG/ACT inhaler  albuterol sulfate HFA (PROVENTIL;VENTOLIN;PROAIR) 108 (90 Base) MCG/ACT inhaler    Send to [facility] on [address].

## 2021-07-08 MED ORDER — ADVAIR DISKUS 250-50 MCG/ACT IN AEPB
250-50 MCG/ACT | RESPIRATORY_TRACT | 2 refills | Status: DC
Start: 2021-07-08 — End: 2021-09-26

## 2021-07-08 NOTE — Telephone Encounter (Signed)
Notified patient per Dr. Quentin Ore that Her platelets were low but about the same as before, so stable, other than that everything else looked good.      Patient verbally expressed understanding and denied any additional questions, comments and/or concerns.

## 2021-07-08 NOTE — Telephone Encounter (Signed)
Are there any concerns pertaining to the patients labs that were drawn on 06/25/2021. Patient states that she can not see them in Mychart.

## 2021-07-25 ENCOUNTER — Encounter

## 2021-07-25 MED ORDER — ALPRAZOLAM 0.25 MG PO TABS
0.25 MG | ORAL_TABLET | Freq: Two times a day (BID) | ORAL | 0 refills | Status: AC
Start: 2021-07-25 — End: 2021-08-24

## 2021-08-22 ENCOUNTER — Encounter

## 2021-08-22 ENCOUNTER — Encounter: Attending: Student in an Organized Health Care Education/Training Program | Primary: Internal Medicine

## 2021-08-22 MED ORDER — IPRATROPIUM-ALBUTEROL 0.5-2.5 (3) MG/3ML IN SOLN
Freq: Four times a day (QID) | RESPIRATORY_TRACT | 2 refills | Status: AC | PRN
Start: 2021-08-22 — End: 2022-07-09

## 2021-08-22 NOTE — Telephone Encounter (Signed)
-----   Message from [facility] sent at 08/22/2021  2:23 PM EDT -----  Subject: Refill Request    QUESTIONS  Name of Medication? ipratropium-albuterol (DUONEB) 0.5-2.5 (3) MG/3ML SOLN   nebulizer solution  Patient-reported dosage and instructions? 0.5-2.5 (3) mg/61ml   How many days do you have left? 0  Preferred Pharmacy? [facility] [phone]  Pharmacy phone number (if available)? [phone]  ---------------------------------------------------------------------------  --------------,  Name of Medication? ALPRAZolam (XANAX) 0.25 MG tablet  Patient-reported dosage and instructions? 0.25 mg tablet taken twice a day     How many days do you have left? 10  Preferred Pharmacy? [facility] [phone]  Pharmacy phone number (if available)? [phone]  ---------------------------------------------------------------------------  --------------  [name] INFO  What is the best way for the office to contact you? OK to leave message on   voicemail  Preferred Call Back Phone Number? [phone]  ---------------------------------------------------------------------------  --------------  SCRIPT ANSWERS  Relationship to Patient? Self

## 2021-08-22 NOTE — Telephone Encounter (Signed)
As of last office note (06/25/2021), patient is still taking this medication and will be out of medication by their appointment (09/25/2021). Pended medication has correct quantity and pended to preferred pharmacy.

## 2021-08-26 ENCOUNTER — Encounter

## 2021-08-26 NOTE — Telephone Encounter (Signed)
As of last office note (06/25/2021), patient is still taking this medication and will be out of medication by their appointment (09/25/2021). Pended medication has correct quantity and pended to preferred pharmacy.

## 2021-08-26 NOTE — Telephone Encounter (Signed)
-----   Message from Wilfred Curtis sent at 08/26/2021 11:53 AM EDT -----  Subject: Refill Request    QUESTIONS  Name of Medication? ALPRAZolam (XANAX) 0.25 MG tablet  Patient-reported dosage and instructions? .25 MG 2 a day  How many days do you have left? 0  Preferred Pharmacy? [facility] [phone]  Pharmacy phone number (if available)? [phone]  ---------------------------------------------------------------------------  --------------  Elizabeth Ingram INFO  What is the best way for the office to contact you? OK to leave message on   voicemail  Preferred Call Back Phone Number? [phone]  ---------------------------------------------------------------------------  --------------  SCRIPT ANSWERS  Relationship to Patient? Self

## 2021-08-27 MED ORDER — ALPRAZOLAM 0.25 MG PO TABS
0.25 MG | ORAL_TABLET | Freq: Two times a day (BID) | ORAL | 0 refills | Status: DC
Start: 2021-08-27 — End: 2021-09-26

## 2021-09-08 ENCOUNTER — Encounter

## 2021-09-09 ENCOUNTER — Encounter

## 2021-09-09 MED ORDER — NADOLOL 20 MG PO TABS
20 MG | ORAL_TABLET | ORAL | 0 refills | Status: DC
Start: 2021-09-09 — End: 2021-09-26

## 2021-09-09 MED ORDER — AMLODIPINE BESYLATE 10 MG PO TABS
10 MG | ORAL_TABLET | ORAL | 0 refills | Status: DC
Start: 2021-09-09 — End: 2021-10-08

## 2021-09-09 NOTE — Telephone Encounter (Signed)
As of last office note (06/25/2021), patient is still taking this medication and will be out of medication by their appointment (09/25/2021). Pended medication has correct quantity and pended to preferred pharmacy.

## 2021-09-25 ENCOUNTER — Encounter: Payer: MEDICARE | Attending: Internal Medicine | Primary: Internal Medicine

## 2021-09-25 ENCOUNTER — Encounter

## 2021-09-25 NOTE — Telephone Encounter (Signed)
Patient was scheduled to be seen today Wednesday September 26, 2021 at 2 pm and she called this morning at 9:18 am to cancel and reschedule for Monday September 30, 2021. Patient has a record of cancelling her scheduled appointment on the day her medication will end and rescheduling.    As of last office note (06/25/2021), patient is still taking this medication and will be out of medication by their appointment (09/30/2021). Pended medication has correct quantity and pended to preferred pharmacy.

## 2021-09-25 NOTE — Telephone Encounter (Signed)
-----   Message from Alroy Dust sent at 09/25/2021  9:24 AM EDT -----  Subject: Refill Request    QUESTIONS  Name of Medication? ADVAIR DISKUS 250-50 MCG/ACT AEPB diskus inhaler  Patient-reported dosage and instructions? 2 puff twice daily  How many days do you have left? 0  Preferred Pharmacy? [facility] [phone]  Pharmacy phone number (if available)? [phone]  ---------------------------------------------------------------------------  --------------,  Name of Medication? Other - Blood pressure medication (she isn't sure what   it's called)  Patient-reported dosage and instructions? 1 tab daily  How many days do you have left? 0  Preferred Pharmacy? [facility] [phone]  Pharmacy phone number (if available)? [phone]  ---------------------------------------------------------------------------  --------------,  Name of Medication? ALPRAZolam (XANAX) 0.25 MG tablet  Patient-reported dosage and instructions? 1 tab twice daily  How many days do you have left? 0  Preferred Pharmacy? [facility] [phone]  Pharmacy phone number (if available)? [phone]  ---------------------------------------------------------------------------  --------------  [name] INFO  What is the best way for the office to contact you? OK to leave message on   voicemail  Preferred Call Back Phone Number? [phone]  ---------------------------------------------------------------------------  --------------  SCRIPT ANSWERS  Relationship to Patient? Self

## 2021-09-26 MED ORDER — FLUTICASONE-SALMETEROL 250-50 MCG/ACT IN AEPB
250-50 MCG/ACT | RESPIRATORY_TRACT | 2 refills | Status: DC
Start: 2021-09-26 — End: 2021-12-11

## 2021-09-26 MED ORDER — ALPRAZOLAM 0.25 MG PO TABS
0.25 MG | ORAL_TABLET | Freq: Two times a day (BID) | ORAL | 0 refills | Status: DC
Start: 2021-09-26 — End: 2021-10-08

## 2021-09-26 MED ORDER — NADOLOL 20 MG PO TABS
20 MG | ORAL_TABLET | ORAL | 1 refills | Status: DC
Start: 2021-09-26 — End: 2022-05-08

## 2021-09-30 ENCOUNTER — Encounter: Payer: MEDICARE | Attending: Internal Medicine | Primary: Internal Medicine

## 2021-10-04 ENCOUNTER — Encounter: Payer: MEDICARE | Attending: Internal Medicine | Primary: Internal Medicine

## 2021-10-08 ENCOUNTER — Ambulatory Visit: Admit: 2021-10-08 | Discharge: 2021-10-08 | Payer: MEDICARE | Attending: Internal Medicine | Primary: Internal Medicine

## 2021-10-08 ENCOUNTER — Encounter

## 2021-10-08 DIAGNOSIS — E876 Hypokalemia: Secondary | ICD-10-CM

## 2021-10-08 MED ORDER — ALPRAZOLAM 0.25 MG PO TABS
0.25 MG | ORAL_TABLET | Freq: Two times a day (BID) | ORAL | 2 refills | Status: AC
Start: 2021-10-08 — End: 2022-01-06

## 2021-10-08 MED ORDER — NICOTINE 21 MG/24HR TD PT24
21 MG/24HR | MEDICATED_PATCH | Freq: Every day | TRANSDERMAL | 3 refills | Status: AC
Start: 2021-10-08 — End: 2022-04-07

## 2021-10-08 MED ORDER — AMLODIPINE BESYLATE 10 MG PO TABS
10 MG | ORAL_TABLET | ORAL | 2 refills | Status: DC
Start: 2021-10-08 — End: 2021-11-11

## 2021-10-08 MED ORDER — SPIRONOLACTONE 50 MG PO TABS
50 MG | ORAL_TABLET | Freq: Every day | ORAL | 1 refills | Status: AC
Start: 2021-10-08 — End: 2021-12-07

## 2021-10-08 MED ORDER — BUPROPION HCL ER (SR) 100 MG PO TB12
100 MG | ORAL_TABLET | ORAL | 3 refills | Status: AC
Start: 2021-10-08 — End: 2022-01-08

## 2021-10-08 MED ORDER — CITALOPRAM HYDROBROMIDE 40 MG PO TABS
40 MG | ORAL_TABLET | Freq: Every day | ORAL | 2 refills | Status: AC
Start: 2021-10-08 — End: 2022-07-10

## 2021-10-08 NOTE — Progress Notes (Signed)
[place] Cecill[place] Menees, D.O.   [facility]  [address]  [place], [place] [address]  Tel: [phone]    Office Visit: Follow Up     Patient Name: Elizabeth Ingram   DOB:  08-29-1961   MRN:   [id number]      Today's Date: 10/08/21 11:18 AM    Subjective     The patient is a 60 y.o.-year-old female who presents for follow-up.  She was recently in the ED for hypokalemia and hypomagnesemia.  Presented with a couple days of abdominal pain, nausea, vomiting, and diarrhea.  She denied any bloody emesis or stools at the time.  Her potassium was notable for 3.0.  CT abdomen pelvis without contrast showed cholelithiasis.  She had also reported diarrhea and has been using lactulose.  She was admitted for hydration and symptom management and received Zofran and IV fluids.  She was able to tolerate oral intake and discharged home.  She was also found to have AKI on top of CKD stage IIIa.  Her creatinine did improve from 1.98 on admission to 1.15 on day of discharge.  She received p.o. and IV potassium replacement and was discharged on potassium supplementation.    She has an appointment with gastroenterology on 10/22/2021.  She has an appointment with hematologist on 11/04/2021 with Dr. Anola Gurney.     Today:   She states she is feeling better now but states she now has a sinus infection. She has had green nasal drainage for 2-3 days. She denies cough, fever, or chills. She states it is mostly frontal nasal drainage but she has some post nasal drip.     She was started on Aldactone in the ED.     She states she is still smoking about 1 pack per day. She is interested in quitting. She tried Wellbutrin in the past a long time ago and states it did not help much.     Review of Systems   Constitutional:  Negative for chills and fever.   HENT:  Positive for congestion, postnasal drip, rhinorrhea and sinus pressure. Negative for sinus pain.    Respiratory:  Negative for cough and shortness of breath.    Cardiovascular:  Negative.    Gastrointestinal: Negative.  Negative for constipation, diarrhea and vomiting.   Musculoskeletal: Negative.    Skin: Negative.    Neurological:  Positive for headaches.   Psychiatric/Behavioral: Negative.        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)     Cirrhosis of liver without ascites (HCC) 03/06/2021    Obesity (BMI 30.0-34.9) 03/06/2021    Primary hypertension 03/06/2021    Stage 2 chronic kidney disease 03/06/2021    Stage 2 chronic kidney disease     Thrombocytopenia (HCC) 04/17/2021     Social History     Socioeconomic History    Marital status: Single     Spouse name: Not on file    Number of children: Not on file    Years of education: Not on file    Highest education level: Not on file   Occupational History    Not on file   Tobacco Use    Smoking status: Every Day     Packs/day: 0.50     Years: 40.00     Pack years: 20.00  Types: Cigarettes    Smokeless tobacco: Never   Substance and Sexual Activity    Alcohol use: No    Drug use: No    Sexual activity: Yes     Partners: Male   Other Topics Concern    Not on file   Social History Narrative    Not on file     Social Determinants of Health     Financial Resource Strain: Not on file   Food Insecurity: Not on file   Transportation Needs: Not on file   Physical Activity: Insufficiently Active    Days of Exercise per Week: 3 days    Minutes of Exercise per Session: 20 min   Stress: Not on file   Social Connections: Not on file   Intimate Partner Violence: Not on file   Housing Stability: Not on file         Current Outpatient Medications   Medication Sig    lactulose (CHRONULAC) 10 GM/15ML solution TAKE 30 ML BY MOUTH TWICE DAILY    ALPRAZolam (XANAX) 0.25 MG tablet Take 1 tablet by mouth in the morning and at bedtime for 90 days.    amLODIPine (NORVASC) 10 MG tablet TAKE 1 TABLET BY MOUTH EVERY MORNING    citalopram (CELEXA) 40 MG  tablet Take 1 tablet by mouth daily Citalopram 40 mg tablet TAKE 1 TABLET BY MOUTH EVERY MORNING    spironolactone (ALDACTONE) 50 MG tablet Take 1 tablet by mouth daily    fluticasone-salmeterol (ADVAIR DISKUS) 250-50 MCG/ACT AEPB diskus inhaler USE 1 INHALATION BY MOUTH TWICE DAILY    nadolol (CORGARD) 20 MG tablet TAKE 1 TABLET BY MOUTH DAILY    ipratropium-albuterol (DUONEB) 0.5-2.5 (3) MG/3ML SOLN nebulizer solution Inhale 3 mLs into the lungs every 6 hours as needed for Shortness of Breath INHALE CONTENTS OF 1 VIAL VIA NEBULIZER DAILY AS NEEDED FOR WHEEZING    furosemide (LASIX) 20 MG tablet Take 1 tablet by mouth daily    omeprazole (PRILOSEC) 40 MG delayed release capsule Take 1 capsule by mouth daily    ondansetron (ZOFRAN) 8 MG tablet Take 1 tablet by mouth every 8 hours as needed for Nausea or Vomiting Ondansetron HCl 8 mg tablet    albuterol sulfate HFA (PROVENTIL;VENTOLIN;PROAIR) 108 (90 Base) MCG/ACT inhaler INHALE 1 PUFF BY MOUTH EVERY 4 HOURS AS NEEDED FOR WHEEZING    zinc sulfate (ZINCATE) 220 (50 Zn) MG capsule Zinc sulfate 50 mg zinc (220 mg) capsule TAKE ONE CAPSULE BY MOUTH TWICE DAILY WITH MEALS.     No current facility-administered medications for this visit.        Objective     Vitals:    10/08/21 1101   BP: 133/73   Site: Left Upper Arm   Position: Sitting   Cuff Size: Large Adult   Pulse: 62   Resp: 12   Temp: 98.1 ??F (36.7 ??C)   TempSrc: Temporal   SpO2: 94%   Weight: 173 lb (78.5 kg)   Height: 5\' 5"  (1.651 m)      Physical Exam:  Constitutional: Appears well kempt. Alert/oriented x3. In no acute distress.  Head: Normocephalic No trauma. No deformity.   Cardiac: Heart with normal rate/rhythm. No murmurs. No gallops. Pulses normal.   Pulmonary: Lungs clear to auscultation bilaterally. In no respiratory distress. No wheezing. No rales. No rhonci.   Psychiatric: Normal thought content. Normal behavior. Normal judgment.     Recommendations     Assessment:  Patient Active Problem List  Diagnosis    Primary hypertension    Anxiety and depression    Obesity (BMI 30.0-34.9)    Cirrhosis of liver without ascites (HCC)    Stage 2 chronic kidney disease    Thrombocytopenia (HCC)    Shortness of breath    Nausea    Gastroesophageal reflux disease without esophagitis    Decreased visual acuity      Status of above conditions: stable     Plan:  -follow up with GI and hematology   -refill medications   -Check CBC, BMP, and liver panel today  -start Wellbutrin for smoking cessation; no history of seizures   -start nicotine patches for smoking cessation   -Patient will discuss with GI on continuation of spironolactone, we refill that for her today  -Advised patient to cut back on smoking cigarettes 1 cigarette/week until she is down to 0.    -Previous medical records and/or labs/tests available to me reviewed, any records outstanding not available requested  -The risks, benefits, and medical necessity of all medications, tests labs, and any other orders that were ordered at today's visit were discussed with the patient including all elective or patient requested orders.  Decision to order or not order tests or based on this risk/benefit ratio and medical necessity.  -The patient expresses understanding of the plan as I've explained it to her and is in agreement with the current plan.    Follow Up:     Total Time: 30 minutes (chart reviewing and in-exam time)    Signed: [place] Shion Bluestein, D.O.  10/08/21  11:18 AM

## 2021-10-09 ENCOUNTER — Encounter

## 2021-10-09 LAB — CBC
Hematocrit: 44.6 % (ref 35.8–46.3)
Hemoglobin: 14.9 g/dL (ref 11.7–15.4)
MCH: 29.3 PG (ref 26.1–32.9)
MCHC: 33.4 g/dL (ref 31.4–35.0)
MCV: 87.8 FL (ref 82–102)
MPV: 12.4 FL — ABNORMAL HIGH (ref 9.4–12.3)
Platelets: 161 10*3/uL (ref 150–450)
RBC: 5.08 M/uL (ref 4.05–5.2)
RDW: 14.7 % — ABNORMAL HIGH (ref 11.9–14.6)
WBC: 9.3 10*3/uL (ref 4.3–11.1)
nRBC: 0 10*3/uL (ref 0.0–0.2)

## 2021-10-09 LAB — HEPATIC FUNCTION PANEL
ALT: 23 U/L (ref 12–65)
AST: 33 U/L (ref 15–37)
Albumin/Globulin Ratio: 1 (ref 0.4–1.6)
Albumin: 3.8 g/dL (ref 3.5–5.0)
Alk Phosphatase: 118 U/L (ref 50–136)
Bilirubin, Direct: 0.3 MG/DL (ref <0.4)
Globulin: 4 g/dL (ref 2.8–4.5)
Total Bilirubin: 0.7 MG/DL (ref 0.2–1.1)
Total Protein: 7.8 g/dL (ref 6.3–8.2)

## 2021-10-09 LAB — BASIC METABOLIC PANEL
Anion Gap: 10 mmol/L (ref 2–11)
BUN: 7 MG/DL (ref 6–23)
CO2: 22 mmol/L (ref 21–32)
Calcium: 9.4 MG/DL (ref 8.3–10.4)
Chloride: 106 mmol/L (ref 101–110)
Creatinine: 1.1 MG/DL — ABNORMAL HIGH (ref 0.6–1.0)
Est, Glom Filt Rate: 58 mL/min/{1.73_m2} — ABNORMAL LOW (ref >60)
Glucose: 108 mg/dL — ABNORMAL HIGH (ref 65–100)
Potassium: 2.9 mmol/L — ABNORMAL LOW (ref 3.5–5.1)
Sodium: 138 mmol/L (ref 133–143)

## 2021-10-09 MED ORDER — POTASSIUM CHLORIDE CRYS ER 10 MEQ PO TBCR
10 MEQ | ORAL_TABLET | Freq: Two times a day (BID) | ORAL | 1 refills | Status: DC
Start: 2021-10-09 — End: 2022-04-08

## 2021-10-09 NOTE — Progress Notes (Signed)
Patient's potassium yesterday was found to be low at 2.9, we will order oral potassium 10 mEq twice daily and recheck her potassium next week.  She was instructed to go to the emergency department if she is experiencing any of the symptoms of hypokalemia, including but not limited to muscle weakness, palpitations, dizziness, lightheadedness, nausea, vomiting, diarrhea, shortness of breath.    [place] Oreoluwa Gilmer, D.O.  Internal Medicine   [facility]

## 2021-10-10 ENCOUNTER — Encounter

## 2021-10-10 MED ORDER — ALBUTEROL SULFATE HFA 108 (90 BASE) MCG/ACT IN AERS
108 (90 Base) MCG/ACT | RESPIRATORY_TRACT | 3 refills | Status: AC
Start: 2021-10-10 — End: 2022-01-08

## 2021-10-10 NOTE — Telephone Encounter (Signed)
As of last office note (10/08/2021), patient is still taking this medication and will be out of medication by their appointment (01/08/2022). Pended medication has correct quantity and pended to preferred pharmacy.

## 2021-10-10 NOTE — Telephone Encounter (Signed)
Pt called in requesting refills for medication  albuterol sulfate HFA (PROVENTIL;VENTOLIN;PROAIR) 108 (90 Base) MCG/ACT inhaler  fluticasone-salmeterol (ADVAIR DISKUS) 250-50 MCG/ACT AEPB diskus inhaler  That needs to be sent to the [facility] on [address]. Pt can be reached at [phone]. Thank you.

## 2021-10-14 ENCOUNTER — Encounter: Admit: 2021-10-14 | Discharge: 2021-10-14 | Payer: MEDICARE | Primary: Internal Medicine

## 2021-10-14 DIAGNOSIS — E876 Hypokalemia: Secondary | ICD-10-CM

## 2021-10-15 LAB — POTASSIUM: Potassium: 4.4 mmol/L (ref 3.5–5.1)

## 2021-10-17 ENCOUNTER — Encounter: Attending: Internal Medicine | Primary: Internal Medicine

## 2021-10-21 NOTE — Telephone Encounter (Signed)
Patient has been informed.

## 2021-10-21 NOTE — Telephone Encounter (Signed)
-----   Message from [place] sent at 10/21/2021 11:04 AM EST -----  Subject: Results Request    QUESTIONS  Results: Potassium; Ordered by: [place] J Grnak   Date Performed: 2021-10-14  ---------------------------------------------------------------------------  --------------  Elizabeth Ingram INFO    [phone]; OK to leave message on voicemail  ---------------------------------------------------------------------------  --------------

## 2021-10-21 NOTE — Telephone Encounter (Signed)
Patient request most recent K+ lab result. Please advise on next steps.

## 2021-11-01 MED ORDER — ONDANSETRON 8 MG PO TBDP
8 MG | ORAL_TABLET | ORAL | 1 refills | Status: DC
Start: 2021-11-01 — End: 2021-12-26

## 2021-11-01 NOTE — Telephone Encounter (Signed)
As of last office note (10/08/2021), patient is still taking this medication and will be out of medication by their appointment (01/08/2022). Pended medication has correct quantity and pended to preferred pharmacy.    Request submited to Dr.Eric Pascal Lux (On Call Physician) in absence of Dr.Jordan Grnak (PCP).

## 2021-11-07 ENCOUNTER — Encounter

## 2021-11-11 ENCOUNTER — Encounter

## 2021-11-11 MED ORDER — AMLODIPINE BESYLATE 10 MG PO TABS
10 MG | ORAL_TABLET | ORAL | 0 refills | Status: DC
Start: 2021-11-11 — End: 2022-02-17

## 2021-11-11 NOTE — Telephone Encounter (Signed)
As of last office note (10/08/2021), patient is still taking this medication and will be out of medication by their appointment (01/08/2022). Pended medication has correct quantity and pended to preferred pharmacy.      Request submited to Dr.Eric Pascal Lux (On Call Physician) in absence of Dr.Jordan Grnak (PCP).

## 2021-11-11 NOTE — Telephone Encounter (Signed)
-----   Message from Lenard Lance sent at 11/07/2021  2:59 PM EST -----  Subject: Medication Problem    Medication: spironolactone (ALDACTONE) 50 MG tablet  Dosage: 1 daily  Ordering Provider: undefined    Question/Problem: Melenie would rather have the amlodipine instead of this   medication and would like a call back to discuss this further.       Pharmacy: [facility] [address] [place] - [address]   - P [phone] Carmon Ginsberg [phone]    ---------------------------------------------------------------------------  --------------  [name] INFO  [phone]; OK to leave message on voicemail  ---------------------------------------------------------------------------  --------------    SCRIPT ANSWERS  Relationship to Patient: Self

## 2021-11-11 NOTE — Telephone Encounter (Signed)
Attempt to contact patient to discuss concerns was unsuccessful, voicemail box not set up.

## 2021-11-11 NOTE — Telephone Encounter (Signed)
-----   Message from Orbie Pyo sent at 11/08/2021 11:56 AM EST -----  Subject: Refill Request    QUESTIONS  Name of Medication? amLODIPine (NORVASC) 10 MG tablet  Patient-reported dosage and instructions? 1 TAB A DAY  How many days do you have left? 3  Preferred Pharmacy? [facility] [phone]  Pharmacy phone number (if available)? [phone]  Additional Information for Provider? PT NEEDS A REFILL ASAP ,PTS CAT HIT   HER PILLS AND SHE LOST MOST OF THEM ,PT ONLY HAS 3 LEFT NEEDS ASAP   ---------------------------------------------------------------------------  --------------  CALL BACK INFO  What is the best way for the office to contact you? OK to leave message on   voicemail  Preferred Call Back Phone Number? [phone]  ---------------------------------------------------------------------------  --------------  SCRIPT ANSWERS  Relationship to Patient? Self

## 2021-11-13 NOTE — Telephone Encounter (Signed)
Attempt to contact patient to discuss concerns, was unsuccessful. Voicemail to return call was made.

## 2021-11-13 NOTE — Telephone Encounter (Signed)
-----   Message from Denton Meek sent at 11/12/2021  4:39 PM EST -----  Subject: Message to Provider    QUESTIONS  Information for Provider? pt needs another Hep B shot as her first one   didn't take she claims per her G.I. Doctor.   ---------------------------------------------------------------------------  --------------  [name] INFO  [phone]; OK to leave message on voicemail  ---------------------------------------------------------------------------  --------------  SCRIPT ANSWERS  Relationship to Patient? Self

## 2021-11-14 NOTE — Telephone Encounter (Signed)
Expressed to patient that we do not administer Hep B immunizations in the office, however she could contact [facility].     Patient verbally expressed understanding and denied all additional questions, comments and/or concerns.

## 2021-11-14 NOTE — Telephone Encounter (Signed)
Spoke with patient and she states that the Spironolactone 50 MG is causing her to feel "weird", stating she becomes very nervous, her breathing shortens and she believes she is having panic attacks. Patient has stopped the medication for a few days now and would like to continue just taking her Amlodipine 10 MG for her Hypertension management.    Expressed to patient that I would forward her concern to the On Call Physician (Dr. Danelle Earthly) in the absence of PCP (Dr. [place] Grnak)

## 2021-11-14 NOTE — Telephone Encounter (Signed)
Per the last office note the continuation of aldactone was supposed to be adjudicated by gastroenterology.  She can stop it and follow up with GI

## 2021-11-14 NOTE — Telephone Encounter (Signed)
Notified patient per Dr. Danelle Earthly (On Call Physician) that per the last office note the continuation of aldactone was supposed to be adjudicated by gastroenterology.  She can stop it and follow up with GI.    Patient verbally expressed understanding and denied all additional questions, comments and/or concerns.

## 2021-12-04 NOTE — Telephone Encounter (Signed)
Dr. Quentin Ore pt called in stating that she needs blood work and last office visit notes faxed to [facility] and ATTN to [facility]. Fax # is [phone]. Pt can be reached at [phone]. Thank you.

## 2021-12-11 MED ORDER — FLUTICASONE-SALMETEROL 250-50 MCG/ACT IN AEPB
250-50 MCG/ACT | RESPIRATORY_TRACT | 0 refills | Status: AC
Start: 2021-12-11 — End: 2022-01-08

## 2021-12-11 NOTE — Telephone Encounter (Signed)
Requested documents faxed manually to contact information provided at patient request.

## 2021-12-11 NOTE — Telephone Encounter (Signed)
As of last office note (10/08/2021), patient is still taking this medication and will be out of medication by their appointment (01/08/2022). Pended medication has correct quantity and pended to preferred pharmacy.    Request submited to Dr.Kurt Hansel Starling (On Call Physician) in absence of Dr.Jordan Grnak (PCP).

## 2021-12-26 MED ORDER — ONDANSETRON 8 MG PO TBDP
8 MG | ORAL_TABLET | ORAL | 1 refills | Status: DC
Start: 2021-12-26 — End: 2022-03-04

## 2021-12-26 NOTE — Telephone Encounter (Signed)
As of last office note (10/08/2021), patient is still taking this medication and will be out of medication by their appointment (01/08/2022). Pended medication has correct quantity and pended to preferred pharmacy.

## 2022-01-08 ENCOUNTER — Encounter

## 2022-01-08 ENCOUNTER — Ambulatory Visit: Admit: 2022-01-08 | Discharge: 2022-01-08 | Payer: MEDICARE | Attending: Internal Medicine | Primary: Internal Medicine

## 2022-01-08 MED ORDER — ALPRAZOLAM 0.5 MG PO TABS
0.5 MG | ORAL_TABLET | Freq: Two times a day (BID) | ORAL | 2 refills | Status: DC | PRN
Start: 2022-01-08 — End: 2022-04-08

## 2022-01-08 MED ORDER — FLUTICASONE-SALMETEROL 250-50 MCG/ACT IN AEPB
250-50 MCG/ACT | RESPIRATORY_TRACT | 0 refills | Status: DC
Start: 2022-01-08 — End: 2022-03-04

## 2022-01-08 MED ORDER — ALBUTEROL SULFATE HFA 108 (90 BASE) MCG/ACT IN AERS
108 (90 Base) MCG/ACT | RESPIRATORY_TRACT | 3 refills | Status: AC
Start: 2022-01-08 — End: 2022-07-09

## 2022-01-08 MED ORDER — BUPROPION HCL ER (SR) 150 MG PO TB12
150 MG | ORAL_TABLET | Freq: Every day | ORAL | 0 refills | Status: DC
Start: 2022-01-08 — End: 2022-04-07

## 2022-01-08 MED ORDER — ALPRAZOLAM 0.25 MG PO TABS
0.25 MG | ORAL_TABLET | Freq: Two times a day (BID) | ORAL | 2 refills | Status: DC
Start: 2022-01-08 — End: 2022-01-08

## 2022-01-08 MED ORDER — TRELEGY ELLIPTA 100-62.5-25 MCG/ACT IN AEPB
100-62.5-25 MCG/ACT | Freq: Every day | RESPIRATORY_TRACT | 3 refills | Status: DC
Start: 2022-01-08 — End: 2022-07-09

## 2022-01-08 NOTE — Telephone Encounter (Signed)
Called pharmacy to cancel XANAX 0.25 MG per Dr.Grnak    Pharmacy accepted prescription cancellation

## 2022-01-08 NOTE — Patient Instructions (Signed)
HOUSING RESOURCES    [employer] [employer]  What they offer: Housing Choice Vouchers (Section 8) rental assistance available to persons with disabilities, families with children, and older adults whose household income is below 80% the median income for [place].   Website:www.tgha.net  Phone: 864-467-4205    [employer] Homeless - National Call Center   o What they offer: Free 24/7 access to trained counselors for local resources and assistance   o Website: https://www.va.gov[place]homeless/nationalcallcenter.asp   o Phone Number: 877-424-3838 (text messaging accepted)       Afforablehousing.com:https://www.affordablehousing.com[place]    [place] Housing Search:https://www.schousingsearch.com/    UpstateHomeless.com:http://www.upstatehomeless.com/pdfs/resources-[place].pdf    [employer] Community Resource Guide: http://www.unitedwaygc.org/community-resource-guide.php     [employer] Connections Resource List: http://unitedhousingconnections.org/need-help      Need additional resources? Call 211 or visit Find Help:  https://www.findhelp.org/

## 2022-01-08 NOTE — Progress Notes (Signed)
[name], D.O.   [name]  [address]  [place], [place] [address]  Tel: [phone]    Office Visit: Follow Up     Patient Name: Elizabeth Ingram   DOB:  1961/08/02   MRN:   [id number]      Today's Date: 01/08/22 1:04 PM    Subjective     The patient is a 61 y.o.-year-old female who presents for follow-up.    Hypertension  -Amlodipine 10 mg daily  -Spironolactone 50 mg daily; stopped by GI because her BP has been stable and she has not had any cirrhosis  -Lasix 20 mg daily    Tobacco dependence  -Started Wellbutrin and nicotine patches at last visit  -she states she has cut down to 5-6 cigarettes from almost an entire pack     Anxiety and depression  -she states her anxiety is higher now and she states  she has had a lot of stress too. She states her depression is under good control right now  -she is on Xanax 0.25 BID PRN  -she is on Wellbutrin 100 mg daily   -Celexa 40 mg daily     Cirrhosis of the liver  -Follows with GI, last seen via telemedicine on 11/08/2021  -MELD-Na score of 9 (02/2021)  -Spironolactone 50 mg daily, stopped by GI because her BP has been stable and she has not had any cirrhosis  -Lasix 50 mg daily  -EGD scheduled for 05/14/2022  -Nadalol (Corgard) daily 20 mg for esophageal varices  -She has been sticking to a low-sodium diet  -Lactulose daily   -According to the last GI note from November 08, 2021 she was due for ultrasound for liver cancer screening in December and another one in June, she is on an every 67-month surveillance schedule; she states she did get this done, but it is not in the computer     GERD  -Omeprazole 40 mg daily    Obesity  -Weight is 181 pounds and BMI is 30.12 today    Stage II CKD  -Last creatinine 1.10 and last GFR 58  -She denies NSAID use    COPD  -Advair, this controls her breathing well, she reports no issues breathing  -Albuterol   -Duonebs  -does not follow with pulm    History of thrombocytopenia    Health maintenance  -Due for  colonoscopy in 2029  -Due for mammogram in 2024  -Low-dose CAT scan due in April 2023  -Annual wellness visit due in June 2023  -Due for Pap smear in 2027    Today:     Review of Systems   Constitutional: Negative.    HENT: Negative.     Respiratory: Negative.     Cardiovascular: Negative.    Gastrointestinal: Negative.    Genitourinary: Negative.    Musculoskeletal:  Positive for back pain.   Skin: Negative.    Neurological: Negative.    Hematological: Negative.    Psychiatric/Behavioral: Negative.        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)     Cirrhosis of liver without ascites (HCC) 03/06/2021    Obesity (BMI 30.0-34.9) 03/06/2021    Primary hypertension 03/06/2021    Stage 2 chronic kidney disease 03/06/2021    Stage 2 chronic kidney disease     Thrombocytopenia (HCC)  04/17/2021     Social History     Socioeconomic History    Marital status: Single     Spouse name: Not on file    Number of children: Not on file    Years of education: Not on file    Highest education level: Not on file   Occupational History    Not on file   Tobacco Use    Smoking status: Every Day     Packs/day: 0.50     Years: 40.00     Pack years: 20.00     Types: Cigarettes    Smokeless tobacco: Never   Substance and Sexual Activity    Alcohol use: No    Drug use: No    Sexual activity: Yes     Partners: Male   Other Topics Concern    Not on file   Social History Narrative    Not on file     Social Determinants of Health     Financial Resource Strain: Not on file   Food Insecurity: Not on file   Transportation Needs: Not on file   Physical Activity: Insufficiently Active    Days of Exercise per Week: 3 days    Minutes of Exercise per Session: 20 min   Stress: Not on file   Social Connections: Not on file   Intimate Partner Violence: Not on file   Housing Stability: Not on file         Current Outpatient Medications   Medication  Sig    albuterol sulfate HFA (PROVENTIL;VENTOLIN;PROAIR) 108 (90 Base) MCG/ACT inhaler INHALE 1 PUFF BY MOUTH EVERY 4 HOURS AS NEEDED FOR WHEEZING/SOB.    ondansetron (ZOFRAN-ODT) 8 MG TBDP disintegrating tablet DISSOLVE 1 TABLET(8 MG) ON THE TONGUE THREE TIMES DAILY FOR UP TO 30 DOSES AS NEEDED FOR NAUSEA OR VOMITING    fluticasone-salmeterol (ADVAIR DISKUS) 250-50 MCG/ACT AEPB diskus inhaler INHALE 1 PUFF BY MOUTH TWICE DAILY    amLODIPine (NORVASC) 10 MG tablet TAKE 1 TABLET BY MOUTH EVERY MORNING    potassium chloride (KLOR-CON M) 10 MEQ extended release tablet Take 1 tablet by mouth 2 times daily    lactulose (CHRONULAC) 10 GM/15ML solution TAKE 30 ML BY MOUTH TWICE DAILY    citalopram (CELEXA) 40 MG tablet Take 1 tablet by mouth daily Citalopram 40 mg tablet TAKE 1 TABLET BY MOUTH EVERY MORNING    spironolactone (ALDACTONE) 50 MG tablet Take 1 tablet by mouth daily    buPROPion (WELLBUTRIN SR) 100 MG extended release tablet 150 mg p.o. daily for 3 days and then increase to 100 mg twice a day by mouth.    nicotine (NICODERM CQ) 21 MG/24HR Place 1 patch onto the skin daily    nadolol (CORGARD) 20 MG tablet TAKE 1 TABLET BY MOUTH DAILY    ipratropium-albuterol (DUONEB) 0.5-2.5 (3) MG/3ML SOLN nebulizer solution Inhale 3 mLs into the lungs every 6 hours as needed for Shortness of Breath INHALE CONTENTS OF 1 VIAL VIA NEBULIZER DAILY AS NEEDED FOR WHEEZING    furosemide (LASIX) 20 MG tablet Take 1 tablet by mouth daily    omeprazole (PRILOSEC) 40 MG delayed release capsule Take 1 capsule by mouth daily    ondansetron (ZOFRAN) 8 MG tablet Take 1 tablet by mouth every 8 hours as needed for Nausea or Vomiting Ondansetron HCl 8 mg tablet    zinc sulfate (ZINCATE) 220 (50 Zn) MG capsule Zinc sulfate 50 mg zinc (220 mg) capsule TAKE ONE CAPSULE BY MOUTH  TWICE DAILY WITH MEALS.     No current facility-administered medications for this visit.        Objective     There were no vitals filed for this visit.     Physical  Exam:  Constitutional: Appears well kempt. Alert/oriented x3. In no acute distress.  Head: Normocephalic No trauma. No deformity.   Cardiac: Heart with normal rate/rhythm. No murmurs. No gallops. Pulses normal.   Pulmonary: Lungs clear to auscultation bilaterally. In no respiratory distress. No wheezing. No rales. No rhonci.   Psychiatric: Normal thought content. Normal behavior. Normal judgment.     Recommendations     Assessment:  Patient Active Problem List   Diagnosis    Primary hypertension    Anxiety and depression    Obesity (BMI 30.0-34.9)    Cirrhosis of liver without ascites (HCC)    Stage 2 chronic kidney disease    Thrombocytopenia (HCC)    Shortness of breath    Nausea    Gastroesophageal reflux disease without esophagitis    Decreased visual acuity    Tobacco dependence    Hypokalemia      Status of above conditions: Stable    Plan:  Hypertension  -Amlodipine 10 mg daily  -She is on nadolol 20 mg daily for esophageal varices prophylaxis  -Lasix 20 mg daily  -Blood pressure stable today continue current treatment regimen    Tobacco dependence  -Wellbutrin 100 mg daily plus nicotine patches  -she states she has cut down to 5-6 cigarettes from almost an entire pack   -We will increase her Wellbutrin to 150 mg when she runs out of her 100 mg Wellbutrin; may increase to BID next visit    Anxiety and depression  -Increase Xanax 2.5 mg twice daily  -Continue Celexa 40 mg daily  -Continue Wellbutrin 100 mg daily and when she runs out we will increase 250 mg daily    Cirrhosis of the liver  -Follow-up with GI  -MELD-Na score of 9 (02/2021)  -Continue Lasix 50 mg daily  -EGD scheduled for 05/14/2022  -Continue Nadalol (Corgard) daily 20 mg for esophageal varices  -Continue sticking to a low-sodium diet  -Continue lactulose daily   -She will be due for an ultrasound of her liver in June  -Check labs today    GERD  -Omeprazole 40 mg daily    Obesity  -Weight is 181 pounds and BMI is 30.12 today  -Continue diet and  exercise    Stage II CKD  -Last creatinine 1.10 and last GFR 58  -She denies NSAID use, continue to abstain  -Check labs today    COPD  -Advair, this controls her breathing well, she reports no issues breathing  -Discontinue Advair and start Trelegy daily  -Albuterol as needed  -Duonebs as needed  -does not follow with pulm    Health maintenance  -Due for colonoscopy in 2029  -Due for mammogram in 2024  -Low-dose CAT scan due in April 2023  -Annual wellness visit due in June 2023  -Due for Pap smear in 2027    -Previous medical records and/or labs/tests available to me reviewed, any records outstanding not available requested  -The risks, benefits, and medical necessity of all medications, tests labs, and any other orders that were ordered at today's visit were discussed with the patient including all elective or patient requested orders.  Decision to order or not order tests or based on this risk/benefit ratio and medical necessity.  -The patient expresses understanding  of the plan as I've explained it to her and is in agreement with the current plan.    Follow Up: 3 months    Total Time: 30 minutes (chart reviewing and in-exam time)    Signed: [place] Amitai Delaughter, D.O.  01/08/22  1:04 PM

## 2022-01-09 LAB — HEPATIC FUNCTION PANEL
ALT: 14 U/L (ref 12–65)
AST: 35 U/L (ref 15–37)
Albumin/Globulin Ratio: 0.8 (ref 0.4–1.6)
Albumin: 3.6 g/dL (ref 3.5–5.0)
Alk Phosphatase: 153 U/L — ABNORMAL HIGH (ref 50–136)
Bilirubin, Direct: 0.3 MG/DL (ref <0.4)
Globulin: 4.3 g/dL (ref 2.8–4.5)
Total Bilirubin: 0.8 MG/DL (ref 0.2–1.1)
Total Protein: 7.9 g/dL (ref 6.3–8.2)

## 2022-01-09 LAB — CBC WITH AUTO DIFFERENTIAL
Absolute Eos #: 0.1 10*3/uL (ref 0.0–0.8)
Absolute Immature Granulocyte: 0 10*3/uL (ref 0.0–0.5)
Absolute Lymph #: 1.6 10*3/uL (ref 0.5–4.6)
Absolute Mono #: 0.4 10*3/uL (ref 0.1–1.3)
Basophils Absolute: 0 10*3/uL (ref 0.0–0.2)
Basophils: 1 % (ref 0.0–2.0)
Eosinophils %: 1 % (ref 0.5–7.8)
Hematocrit: 42.6 % (ref 35.8–46.3)
Hemoglobin: 13.4 g/dL (ref 11.7–15.4)
Immature Granulocytes: 0 % (ref 0.0–5.0)
Lymphocytes: 26 % (ref 13–44)
MCH: 29.2 PG (ref 26.1–32.9)
MCHC: 31.5 g/dL (ref 31.4–35.0)
MCV: 92.8 FL (ref 82–102)
MPV: 12.9 FL — ABNORMAL HIGH (ref 9.4–12.3)
Monocytes: 6 % (ref 4.0–12.0)
Platelets: 115 10*3/uL — ABNORMAL LOW (ref 150–450)
RBC: 4.59 M/uL (ref 4.05–5.2)
RDW: 14.6 % (ref 11.9–14.6)
Seg Neutrophils: 66 % (ref 43–78)
Segs Absolute: 4.1 10*3/uL (ref 1.7–8.2)
WBC: 6.2 10*3/uL (ref 4.3–11.1)
nRBC: 0 10*3/uL (ref 0.0–0.2)

## 2022-01-09 LAB — BASIC METABOLIC PANEL
Anion Gap: 4 mmol/L (ref 2–11)
BUN: 9 MG/DL (ref 6–23)
CO2: 32 mmol/L (ref 21–32)
Calcium: 9 MG/DL (ref 8.3–10.4)
Chloride: 106 mmol/L (ref 101–110)
Creatinine: 1.1 MG/DL — ABNORMAL HIGH (ref 0.6–1.0)
Est, Glom Filt Rate: 58 mL/min/{1.73_m2} — ABNORMAL LOW (ref >60)
Glucose: 88 mg/dL (ref 65–100)
Potassium: 3.3 mmol/L — ABNORMAL LOW (ref 3.5–5.1)
Sodium: 142 mmol/L (ref 133–143)

## 2022-01-09 NOTE — Telephone Encounter (Signed)
Notified patient to contact her insurance company and verify which DME company they will cover for her to receive her Nebulizer machine from.      Patient verbally expressed understanding and denies any questions, comments or concerns at this time.

## 2022-01-09 NOTE — Telephone Encounter (Signed)
Patient called stating she would like Dr. Burman Freestone nurse to give her call.  She stated she needed a prescription for a new breathing machine.

## 2022-01-10 MED ORDER — NEBULIZER/TUBING/MOUTHPIECE KIT
PACK | Freq: Every day | 0 refills | Status: AC | PRN
Start: 2022-01-10 — End: 2022-02-03

## 2022-01-10 NOTE — Telephone Encounter (Signed)
-----   Message from Jola [facility] sent at 01/10/2022 10:56 AM EST -----  Subject: Message to Provider    QUESTIONS  Information for Provider? Patient calling bc she spoke to [facility]   supply, and they are supposed to send her a Nebulizer machine. She has   used them before. They will needs a Order. Please call her back to discuss   the next steps.   ---------------------------------------------------------------------------  --------------  [name] INFO  [phone]; OK to leave message on voicemail  ---------------------------------------------------------------------------  --------------  SCRIPT ANSWERS  Relationship to Patient? Self

## 2022-01-10 NOTE — Telephone Encounter (Signed)
Patient requesting prescrfiption for Nebulizer Machine be sent to [facility].     Device pended for review and approval.    Patient also requesting recommendations for possible BOOST drinks to help with her eating.

## 2022-01-15 NOTE — Telephone Encounter (Signed)
error 

## 2022-01-16 NOTE — Telephone Encounter (Signed)
Attempt to contact patient to discuss their concerns was unsuccessful.  Voicemail box full,, unable to leave message

## 2022-01-16 NOTE — Telephone Encounter (Signed)
Patient has not received nebulizer, we will be getting papers faxed to [place] for her insurance.

## 2022-01-16 NOTE — Telephone Encounter (Signed)
Pt called in stating she missed her ultrasound appt and they cannot see her without another referral. The referral needs to be sent to [facility]. Pt was seen by Dr. Quentin Ore on 01/08/22. Pt can be reached at [phone]. Thank you.

## 2022-01-17 ENCOUNTER — Telehealth

## 2022-01-17 NOTE — Telephone Encounter (Signed)
Can you refer patient to have a PFT done? Patient last seen on 01/08/2022.

## 2022-01-17 NOTE — Addendum Note (Signed)
Addended by: Dodger Sinning, [place] J on: 01/17/2022 12:11 PM     Modules accepted: Orders

## 2022-01-17 NOTE — Telephone Encounter (Signed)
Patient called, her insurance will not cover a new nebulizer unless she has a dedicated visit with PFT results along with order for her nebulizer.    Elizabeth Ingram states that it has been some years since she had testing done.

## 2022-02-03 ENCOUNTER — Telehealth

## 2022-02-03 MED ORDER — NEBULIZER/TUBING/MOUTHPIECE KIT
PACK | Freq: Every day | 0 refills | Status: AC | PRN
Start: 2022-02-03 — End: ?

## 2022-02-03 NOTE — Telephone Encounter (Signed)
Patient needs a new order for a nebulizer sent to [facility]. Her old machine is broken, they switch it out for a new one at no cost to the patient.    Fax#  [phone]

## 2022-02-03 NOTE — Addendum Note (Signed)
Addended by: Mialani Reicks, [place] J on: 02/03/2022 01:54 PM     Modules accepted: Orders

## 2022-02-03 NOTE — Addendum Note (Signed)
Addended by: Ramond Dial B on: 02/03/2022 01:52 PM     Modules accepted: Orders

## 2022-02-13 ENCOUNTER — Encounter

## 2022-02-17 MED ORDER — AMLODIPINE BESYLATE 10 MG PO TABS
10 MG | ORAL_TABLET | ORAL | 0 refills | Status: DC
Start: 2022-02-17 — End: 2022-05-09

## 2022-02-25 NOTE — Telephone Encounter (Signed)
I called and informed pt that Dr Quentin Ore didn't not order the U/S and we dont have the results. Patient states that it must have been from her other Dr . I ask her Where she got the u/s Done at and she states that [facility] radiology. She states that she will call her other dr .

## 2022-02-25 NOTE — Telephone Encounter (Signed)
-----   Message from April Nagle sent at 02/24/2022  2:16 PM EDT -----  Subject: Results Request    QUESTIONS  Results: ultrasound of kidneys and liver ; Ordered by: [place] J Grnak   Date Performed: 2022-02-03  ---------------------------------------------------------------------------  --------------  Elizabeth Ingram INFO    [phone]; OK to leave message on voicemail  ---------------------------------------------------------------------------  --------------

## 2022-03-04 ENCOUNTER — Encounter

## 2022-03-04 MED ORDER — FUROSEMIDE 20 MG PO TABS
20 MG | ORAL_TABLET | Freq: Every day | ORAL | 2 refills | Status: AC
Start: 2022-03-04 — End: 2022-06-02

## 2022-03-04 MED ORDER — FLUTICASONE-SALMETEROL 250-50 MCG/ACT IN AEPB
250-50 MCG/ACT | RESPIRATORY_TRACT | 2 refills | Status: DC
Start: 2022-03-04 — End: 2022-06-09

## 2022-03-04 MED ORDER — ONDANSETRON 8 MG PO TBDP
8 MG | ORAL_TABLET | ORAL | 3 refills | Status: DC
Start: 2022-03-04 — End: 2022-04-07

## 2022-03-04 NOTE — Telephone Encounter (Signed)
As of last office note (01/08/2022), patient is still taking this medication and will be out of medication by their appointment (04/07/2022). Pended medication has correct quantity and pended to preferred pharmacy.

## 2022-03-04 NOTE — Telephone Encounter (Signed)
The pt called to get a refill on her Advair, Lasix, and Zofran.  She would like to have these sent to [facility] on [address]     I reviewed the pts chart, while she was on the phone. I notified her that the Zofran was already sent to the pharmacy.  However, the Advair was last sent to the pharmacy on 01/08/22 #1 NR and the Lasix was last sent in on 05/20/21 #30x2.  The pt was last seen on 01/08/22 and is scheduled to return on 04/07/22.  I will forward the request to Dr. Quentin Ore to see if he is willing to send these medications in.

## 2022-03-06 ENCOUNTER — Encounter

## 2022-03-12 ENCOUNTER — Ambulatory Visit: Payer: MEDICARE | Primary: Internal Medicine

## 2022-03-17 ENCOUNTER — Inpatient Hospital Stay: Admit: 2022-03-17 | Payer: MEDICARE | Primary: Internal Medicine

## 2022-03-17 DIAGNOSIS — M5416 Radiculopathy, lumbar region: Secondary | ICD-10-CM

## 2022-04-07 ENCOUNTER — Ambulatory Visit: Admit: 2022-04-07 | Discharge: 2022-04-07 | Payer: MEDICARE | Attending: Internal Medicine | Primary: Internal Medicine

## 2022-04-07 DIAGNOSIS — R110 Nausea: Secondary | ICD-10-CM

## 2022-04-07 MED ORDER — ONDANSETRON 8 MG PO TBDP
8 MG | ORAL_TABLET | ORAL | 3 refills | Status: DC
Start: 2022-04-07 — End: 2022-07-09

## 2022-04-07 MED ORDER — BUPROPION HCL ER (SR) 150 MG PO TB12
150 MG | ORAL_TABLET | Freq: Two times a day (BID) | ORAL | 0 refills | Status: AC
Start: 2022-04-07 — End: 2022-07-06

## 2022-04-07 NOTE — Progress Notes (Signed)
Martin[place] Matilde Pottenger, D.O.   [facility]  [place], [place]  Tel: [phone]    Office Visit: Follow Up     Patient Name: Elizabeth Ingram   DOB:  1961/04/05   MRN:   [id number]      Today's Date: 04/07/22 3:18 PM    Subjective     The patient is a 61 y.o.-year-old female who presents for follow-up.    Hypertension  -Amlodipine 10 mg daily  -She is on nadolol 20 mg daily for esophageal varices prophylaxis  -Lasix 20 mg daily     Tobacco dependence  -Wellbutrin 150 mg daily plus nicotine patches  -she states she has cut down to 5-6 cigarettes from almost an entire pack      Anxiety and depression, states this is better   -Xanax 2.5 mg twice daily  -Celexa 40 mg daily  -Wellbutrin 150 mg daily      Cirrhosis of the liver  -MELD-Na score of 9 (02/2021)  -Lasix 50 mg daily  -EGD scheduled for 05/14/2022  -Nadalol (Corgard) daily 20 mg for esophageal varices  -Low-sodium diet  -Lactulose daily   -Patient had ultrasound abdomen right upper quadrant on 02/06/2022 which showed redemonstrated findings of hepatic cirrhosis and portal hypertension, cholelithiasis with a dilated common bile duct but no findings of acute cholecystitis, increased splenomegaly.  -His next ultrasound is 08/20/2022     GERD, well controlled   -Omeprazole 40 mg daily     Obesity  -Weight is 167 pounds and BMI is 27.79 today  -she states she lost 13 pounds since last visit and has had a decreased appetite.      Stage III CKD  -Patient followed up with nephrology on 02/07/2022, her renal function was stable at this visit with recommendations to stay hydrated and follow closely with GI.  Stating renal function should remain relatively stable as long as her liver function remains stable.  Patient potassium continues to be low and it was recommended that she increase intake of high potassium foods and continue her potassium supplements.    Bilateral leg edema  -Nephrology addressed this at the last visit with continuation  of Lasix 20 mg daily but increased to 40 mg 1 time per week and then resume to 20 mg daily  -improved today; she has been walking more     COPD  -Trelegy, started last visit, breathing is improved   -Albuterol as needed  -Duonebs as needed  -does not follow with pulm    Chronic Low back Pain  -She recently saw pain management, they started her on oxycodone 5 mg; she has follow up next week; she would like to go back on Tramadol     Health maintenance  -Due for colonoscopy in 2029  -Due for mammogram in 2024  -Low-dose CAT scan due  -Annual wellness visit due in June 2023  -Due for Pap smear in 2027    Today:   No other new complaints.     Review of Systems   All other systems reviewed and are negative.     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)     Cirrhosis of liver without ascites ([place]) 03/06/2021    Hypokalemia 10/09/2021    Nausea 05/20/2021    Obesity (BMI 30.0-34.9) 03/06/2021  Primary hypertension 03/06/2021    Stage 2 chronic kidney disease 03/06/2021    Stage 2 chronic kidney disease     Thrombocytopenia ([place]) 04/17/2021     Social History     Socioeconomic History    Marital status: Social worker     Spouse name: Not on file    Number of children: Not on file    Years of education: Not on file    Highest education level: Not on file   Occupational History    Not on file   Tobacco Use    Smoking status: Every Day     Packs/day: 0.50     Years: 40.00     Pack years: 20.00     Types: Cigarettes    Smokeless tobacco: Never    Tobacco comments:     Patient down to 5-6 cigarettes a day   Substance and Sexual Activity    Alcohol use: No    Drug use: No    Sexual activity: Yes     Partners: Male   Other Topics Concern    Not on file   Social History Narrative    Not on file     Social Determinants of Health     Financial Resource Strain: Low Risk     Difficulty of Paying Living Expenses: Not hard at all    Food Insecurity: No Food Insecurity    Worried About Charity fundraiser in the Last Year: Never true    [place] in the Last Year: Never true   Transportation Needs: Unknown    Lack of Transportation (Medical): Not on file    Lack of Transportation (Non-Medical): No   Physical Activity: Insufficiently Active    Days of Exercise per Week: 3 days    Minutes of Exercise per Session: 20 min   Stress: Not on file   Social Connections: Not on file   Intimate Partner Violence: Not on file   Housing Stability: Unknown    Unable to Pay for Housing in the Last Year: Not on file    Number of Places Lived in the Last Year: Not on file    Unstable Housing in the Last Year: No         Current Outpatient Medications   Medication Sig    oxyCODONE (ROXICODONE) 5 MG immediate release tablet Take 1 tablet by mouth 2 times daily as needed.    naloxone 4 MG/0.1ML LIQD nasal spray CALL 911. SPR CONTENTS OF ONE SPRAYER (0.1ML) INTO ONE NOSTRIL. REPEAT IN 2-3 MIN IF SYMPTOMS OF OPIOID EMERGENCY PERSIST, ALTERNATE NOSTRILS    ondansetron (ZOFRAN-ODT) 8 MG TBDP disintegrating tablet DISSOLVE 1 TABLET(8 MG) ON THE TONGUE THREE TIMES DAILY FOR UP TO 30 DOSES AS NEEDED FOR NAUSEA OR VOMITING    furosemide (LASIX) 20 MG tablet Take 1 tablet by mouth daily    fluticasone-salmeterol (ADVAIR DISKUS) 250-50 MCG/ACT AEPB diskus inhaler INHALE 1 PUFF BY MOUTH TWICE DAILY    amLODIPine (NORVASC) 10 MG tablet TAKE 1 TABLET BY MOUTH EVERY MORNING    Respiratory Therapy Supplies (NEBULIZER/TUBING/MOUTHPIECE) KIT 1 kit by Does not apply route daily as needed (as needed for wheezing)    albuterol sulfate HFA (PROVENTIL;VENTOLIN;PROAIR) 108 (90 Base) MCG/ACT inhaler INHALE 1 PUFF BY MOUTH EVERY 4 HOURS AS NEEDED FOR WHEEZING/SOB.    fluticasone-umeclidin-vilant (TRELEGY ELLIPTA) 100-62.5-25 MCG/ACT AEPB inhaler Inhale 1 puff into the lungs daily    ALPRAZolam (XANAX) 0.5 MG tablet Take 1  tablet by mouth 2 times daily as needed for Sleep or Anxiety  for up to 90 days. Max Daily Amount: 1 mg    buPROPion (WELLBUTRIN SR) 150 MG extended release tablet Take 1 tablet by mouth daily    potassium chloride (KLOR-CON M) 10 MEQ extended release tablet Take 1 tablet by mouth 2 times daily    lactulose (CHRONULAC) 10 GM/15ML solution TAKE 30 ML BY MOUTH TWICE DAILY    citalopram (CELEXA) 40 MG tablet Take 1 tablet by mouth daily Citalopram 40 mg tablet TAKE 1 TABLET BY MOUTH EVERY MORNING    nicotine (NICODERM CQ) 21 MG/24HR Place 1 patch onto the skin daily    nadolol (CORGARD) 20 MG tablet TAKE 1 TABLET BY MOUTH DAILY    ipratropium-albuterol (DUONEB) 0.5-2.5 (3) MG/3ML SOLN nebulizer solution Inhale 3 mLs into the lungs every 6 hours as needed for Shortness of Breath INHALE CONTENTS OF 1 VIAL VIA NEBULIZER DAILY AS NEEDED FOR WHEEZING    omeprazole (PRILOSEC) 40 MG delayed release capsule Take 1 capsule by mouth daily    ondansetron (ZOFRAN) 8 MG tablet Take 1 tablet by mouth every 8 hours as needed for Nausea or Vomiting Ondansetron HCl 8 mg tablet    zinc sulfate (ZINCATE) 220 (50 Zn) MG capsule Zinc sulfate 50 mg zinc (220 mg) capsule TAKE ONE CAPSULE BY MOUTH TWICE DAILY WITH MEALS.     No current facility-administered medications for this visit.        Objective     Vitals:    04/07/22 1449   BP: 125/65   Site: Left Upper Arm   Position: Sitting   Cuff Size: Large Adult   Pulse: 67   Resp: 12   Temp: 98.2 F (36.8 C)   TempSrc: Temporal   SpO2: 92%   Weight: 167 lb (75.8 kg)   Height: 5' 5" (1.651 m)      Physical Exam:  Constitutional: Appears well kempt. Alert/oriented x3. In no acute distress.  Head: Normocephalic No trauma. No deformity.   Cardiac: Heart with normal rate/rhythm. No murmurs. No gallops. Pulses normal.   Pulmonary: Lungs clear to auscultation bilaterally. In no respiratory distress. No wheezing. No rales. No rhonci.   Psychiatric: Normal thought content. Normal behavior. Normal judgment.     Recommendations     Assessment:  Patient Active  Problem List   Diagnosis    Primary hypertension    Anxiety and depression    Obesity (BMI 30.0-34.9)    Cirrhosis of liver without ascites (HCC)    Stage 2 chronic kidney disease    Shortness of breath    Gastroesophageal reflux disease without esophagitis    Decreased visual acuity    Tobacco dependence    Chronic obstructive pulmonary disease, unspecified COPD type (HCC)      Status of above conditions: stable     Plan:  Hypertension  -Amlodipine 10 mg daily  -She is on nadolol 20 mg daily for esophageal varices prophylaxis  -Lasix 20 mg daily     Tobacco dependence  -Increase Wellbutrin to 150 mg BID     Anxiety and depression, states this is better   -Xanax 2.5 mg twice daily  -Celexa 40 mg daily  -Wellbutrin 150 mg BID      Cirrhosis of the liver  -MELD-Na score of 9 (02/2021)  -Lasix 50 mg daily  -EGD scheduled for 05/14/2022  -Nadalol (Corgard) daily 20 mg for esophageal varices  -Low-sodium diet  -Lactulose daily   -  Patient had ultrasound abdomen right upper quadrant on 02/06/2022 which showed redemonstrated findings of hepatic cirrhosis and portal hypertension, cholelithiasis with a dilated common bile duct but no findings of acute cholecystitis, increased splenomegaly.  -Her next ultrasound is 08/20/2022    Abdominal pain and an unintentional weight loss  -Advised patient to call her GI doctor to get an in office appointment before her colonoscopy/EGD next month or to have her procedures moved up to this month.     GERD, well controlled   -Omeprazole 40 mg daily     Obesity  -Weight is 167 pounds and BMI is 27.79 today  -she states she lost 13 pounds since last visit and has had a decreased appetite.      Stage III CKD  -Patient following with nephrology  -Check CMP today    Bilateral leg edema  -Lasix as needed for swelling but patient is also taking this daily for her cirrhosis  -Patient states this is improved greatly, continue current management     COPD  -Trelegy, patient has experienced a lot of relief  with the Trelegy  -Albuterol as needed  -Duonebs as needed    Chronic Low back Pain  -She recently saw pain management, they started her on oxycodone 5 mg; she has follow up next week; she would like to go back on Tramadol  -Follow-up with pain management     Health maintenance  -Due for colonoscopy in 2029  -Due for mammogram in 2024  -Low-dose CAT ordered  -Annual wellness visit due in June 2023  -Due for Pap smear in 2027    -Previous medical records and/or labs/tests available to me reviewed, any records outstanding not available requested  -The risks, benefits, and medical necessity of all medications, tests labs, and any other orders that were ordered at today's visit were discussed with the patient including all elective or patient requested orders.  Decision to order or not order tests or based on this risk/benefit ratio and medical necessity.  -The patient expresses understanding of the plan as I've explained it to her and is in agreement with the current plan.    Follow Up: 3 months    Total Time: 30 minutes (chart reviewing and in-exam time)    Signed: [place] Tammatha Cobb, D.O.  04/07/22  3:18 PMDiscussed with the patient the current USPSTF guidelines released February 14, 2020 for screening for lung cancer.    For adults aged 75 to 80 years who have a 20 pack-year smoking history and currently smoke or have quit within the past 15 years the grade B recommendation is to:  Screen for lung cancer with low-dose computed tomography (LDCT) every year.  Stop screening once a person has not smoked for 15 years or has a health problem that limits life expectancy or the ability to have lung surgery.    The patient  reports that she has been smoking cigarettes. She has a 20.00 pack-year smoking history. She has never used smokeless tobacco.. Discussed with patient the risks and benefits of screening, including over-diagnosis, false positive rate, and total radiation exposure.  The patient currently exhibits no signs or  symptoms suggestive of lung cancer.  Discussed with patient the importance of compliance with yearly annual lung cancer screenings and willingness to undergo diagnosis and treatment if screening scan is positive.  In addition, the patient was counseled regarding the importance of remaining smoke free and/or total smoking cessation.    Also reviewed the following if the patient has Medicare  that as of January 17, 2021, Medicare only covers LDCT screening in patients aged 50-77 with at least a 20 pack-year smoking history who currently smoke or have quit in the last 15 years

## 2022-04-07 NOTE — Patient Instructions (Signed)
Learning About Lung Cancer Screening  What is screening for lung cancer?     Lung cancer screening is a way to find some lung cancers early, before a person has any symptoms of the cancer.  Lung cancer screening may help those who have the highest risk for lung cancer--people age 60 and older who are or were heavy smokers. For most people, who aren't at increased risk, screening for lung cancer probably isn't helpful.  Screening won't prevent cancer. And it may not find all lung cancers. Lung cancer screening may lower the risk of dying from lung cancer in a small number of people.  How is it done?  Lung cancer screening is done with a low-dose CT (computed tomography) scan. A CT scan uses X-rays, or radiation, to make detailed pictures of your body. Experts recommend that screening be done in medical centers that focus on finding and treating lung cancer.  Who is screening recommended for?  Lung cancer screening is recommended for people age 60 and older who are or were heavy smokers. That means people with a smoking history of at least 20 pack years. A pack year is a way to measure how heavy a smoker you are or were.  To figure out your pack years, multiply how many packs a day on average (assuming 20 cigarettes per pack) you have smoked by how many years you have smoked. For example:  If you smoked 1 pack a day for 20 years, that's 1 times 20. So you have a smoking history of 20 pack years.  If you smoked 2 packs a day for 10 years, that's 2 times 10. So you have a smoking history of 20 pack years.  Experts agree that screening is for people who have a high risk of lung cancer. But experts don't agree on what high risk means. Some say people age 60 or older with at least a 20-pack-year smoking history are high risk. Others say it's people age 55 or older with a 30-pack-year history.  To see if you could benefit from screening, first find out if you are at high risk for lung cancer. Your doctor can help you  decide your lung cancer risk.  What are the risks of screening?  CT screening for lung cancer isn't perfect. It can show an abnormal result when it turns out there wasn't any cancer. This is called a false-positive result. This means you may need more tests to make sure you don't have cancer. These tests can be harmful and cause a lot of worry.  These tests may include more CT scans and invasive testing like a lung biopsy. In a biopsy, the doctor takes a sample of tissue from inside your lung so it can be looked at under a microscope. A biopsy is the only way to tell if you have lung cancer. If the biopsy finds cancer, you and your doctor will have to decide how or whether to treat it.  Some lung cancers found on CT scans are harmless and would not have caused a problem if they had not been found through screening. But because doctors can't tell which ones will turn out to be harmless, most will be treated. This means that you may get treatment--including surgery, radiation, or chemotherapy--that you don't need.  There is a risk of damage to cells or tissue from being exposed to radiation, including the small amounts used in CTs, X-rays, and other medical tests. Over time, exposure to radiation may cause cancer   and other health problems. But in most cases, the risk of getting cancer from being exposed to small amounts of radiation is low. It's not a reason to avoid these tests for most people.  What are the benefits of screening?  Your scan may be normal (negative).  For some people who are at higher risk, screening lowers the chance of dying of lung cancer. How much and how long you smoked helps to determine your risk level. Screening can find some cancers early, when treatment may be more likely to work.  What happens after screening?  The results of your CT scan will be sent to your doctor. Someone from your care team will explain the results of your scan and answer any questions you may have. If you need any  follow-up, he or she will help you understand what to do next.  After a lung cancer screening, you can go back to your usual activities right away.  A lung cancer screening test can't tell if you have lung cancer. If your results are positive, your doctor can't tell whether an abnormal finding is a harmless nodule, cancer, or something else without doing more tests.  What can you do to help prevent lung cancer?  Some lung cancers can't be prevented. But if you smoke, quitting smoking is the best step you can take to prevent lung cancer. If you want to quit, your doctor can recommend medicines or other ways to help.  Follow-up care is a key part of your treatment and safety. Be sure to make and go to all appointments, and call your doctor if you are having problems. It's also a good idea to know your test results and keep a list of the medicines you take.  Where can you learn more?  Go to https://www.healthwise.net/patientEd and enter Q940 to learn more about "Learning About Lung Cancer Screening."  Current as of: May 4, 2022Content Version: 13.6   2006-2023 [employer].   Care instructions adapted under license by [facility]. If you have questions about a medical condition or this instruction, always ask your healthcare professional. [employer] disclaims any warranty or liability for your use of this information.

## 2022-04-08 ENCOUNTER — Encounter

## 2022-04-08 LAB — COMPREHENSIVE METABOLIC PANEL
ALT: 10 U/L — ABNORMAL LOW (ref 12–65)
AST: 31 U/L (ref 15–37)
Albumin/Globulin Ratio: 0.9 (ref 0.4–1.6)
Albumin: 3.6 g/dL (ref 3.5–5.0)
Alk Phosphatase: 138 U/L — ABNORMAL HIGH (ref 50–136)
Anion Gap: 2 mmol/L (ref 2–11)
BUN: 8 MG/DL (ref 6–23)
CO2: 28 mmol/L (ref 21–32)
Calcium: 9.2 MG/DL (ref 8.3–10.4)
Chloride: 109 mmol/L (ref 101–110)
Creatinine: 1.2 MG/DL — ABNORMAL HIGH (ref 0.6–1.0)
Est, Glom Filt Rate: 52 mL/min/{1.73_m2} — ABNORMAL LOW (ref >60)
Globulin: 4.2 g/dL (ref 2.8–4.5)
Glucose: 91 mg/dL (ref 65–100)
Potassium: 3.7 mmol/L (ref 3.5–5.1)
Sodium: 139 mmol/L (ref 133–143)
Total Bilirubin: 1.5 MG/DL — ABNORMAL HIGH (ref 0.2–1.1)
Total Protein: 7.8 g/dL (ref 6.3–8.2)

## 2022-04-08 LAB — CBC
Hematocrit: 45.3 % (ref 35.8–46.3)
Hemoglobin: 14.4 g/dL (ref 11.7–15.4)
MCH: 29.3 PG (ref 26.1–32.9)
MCHC: 31.8 g/dL (ref 31.4–35.0)
MCV: 92.1 FL (ref 82–102)
MPV: 11.3 FL (ref 9.4–12.3)
Platelets: 125 10*3/uL — ABNORMAL LOW (ref 150–450)
RBC: 4.92 M/uL (ref 4.05–5.2)
RDW: 15.8 % — ABNORMAL HIGH (ref 11.9–14.6)
WBC: 8.4 10*3/uL (ref 4.3–11.1)
nRBC: 0 10*3/uL (ref 0.0–0.2)

## 2022-04-08 MED ORDER — POTASSIUM CHLORIDE CRYS ER 10 MEQ PO TBCR
10 MEQ | ORAL_TABLET | Freq: Two times a day (BID) | ORAL | 1 refills | Status: DC
Start: 2022-04-08 — End: 2022-07-09

## 2022-04-08 MED ORDER — ALPRAZOLAM 0.5 MG PO TABS
0.5 MG | ORAL_TABLET | Freq: Two times a day (BID) | ORAL | 2 refills | Status: AC | PRN
Start: 2022-04-08 — End: 2022-07-07

## 2022-04-08 MED ORDER — OMEPRAZOLE 40 MG PO CPDR
40 MG | ORAL_CAPSULE | Freq: Every day | ORAL | 1 refills | Status: AC
Start: 2022-04-08 — End: 2022-07-07

## 2022-04-08 NOTE — Telephone Encounter (Signed)
Patient thought these meds were refilled yesterday - pended

## 2022-04-08 NOTE — Telephone Encounter (Signed)
As of last office note (04/07/2022), patient is still taking this medication and will be out of medication by their appointment (07/08/2022). Pended medication has correct quantity and pended to preferred pharmacy.

## 2022-05-05 ENCOUNTER — Encounter

## 2022-05-08 MED ORDER — NADOLOL 20 MG PO TABS
20 MG | ORAL_TABLET | ORAL | 1 refills | Status: AC
Start: 2022-05-08 — End: 2022-10-23

## 2022-05-08 NOTE — Telephone Encounter (Signed)
As of last office note (04/07/2022), patient is still taking this medication and will be out of medication by their appointment (07/08/2022). Pended medication has correct quantity and pended to preferred pharmacy.

## 2022-05-09 ENCOUNTER — Encounter

## 2022-05-09 MED ORDER — AMLODIPINE BESYLATE 10 MG PO TABS
10 MG | ORAL_TABLET | ORAL | 0 refills | Status: AC
Start: 2022-05-09 — End: ?

## 2022-05-09 NOTE — Telephone Encounter (Signed)
Last script written was 02/17/2022 for 90 days .   Next appt is 07/08/2022

## 2022-05-27 NOTE — Telephone Encounter (Signed)
Patient needs an ER follow up with Dr Quentin Ore, was seen at [facility] ER yesterday.  Please call patient [phone].

## 2022-05-27 NOTE — Telephone Encounter (Signed)
Attempt to contact patient to schedule ER DEPARTMENT follow up appointment, however patient was unavailable. Voicemail to return call was made.

## 2022-05-27 NOTE — Telephone Encounter (Addendum)
Attempt to contact patient to schedule ER DEPARTMENT follow up appointment, however patient was unavailable. Voicemail to return call was made.

## 2022-05-27 NOTE — Telephone Encounter (Signed)
Patient returned your call.  Please call back at [phone]

## 2022-05-28 NOTE — Telephone Encounter (Signed)
Appointment scheduled

## 2022-06-02 ENCOUNTER — Encounter: Payer: MEDICARE | Attending: Internal Medicine | Primary: Internal Medicine

## 2022-06-02 NOTE — Progress Notes (Unsigned)
[place] Kamry Faraci, D.O.   [facility]  [address]  [place], [place] [address]  Tel: [phone]    Office Visit: Follow Up     Patient Name: Elizabeth Ingram   DOB:  1961/10/31   MRN:   [id number]      Today's Date: 06/02/22 1:06 PM    Subjective     The patient is a 61 y.o.-year-old female who presents for follow-up.  Patient went to the ER 06/01/2022 for shortness of breath for the past several weeks and some lower abdominal pain with flank pain similar to previous UTIs.  Patient had a normal bedside echocardiogram and chest x-ray and chest CTA were also normal.  BMP was negative.  Patient was diagnosed with COPD exacerbation with symptom improvement following treatment in the ER.  She was given steroids, antibiotics for her COPD exacerbation and for dental infections and that she was also suspected to have a UTI.  She was given nystatin swish and swallow for thrush and her potassium was also replaced.      Today:     Review of Systems     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)     Cirrhosis of liver without ascites (HCC) 03/06/2021    Hypokalemia 10/09/2021    Nausea 05/20/2021    Obesity (BMI 30.0-34.9) 03/06/2021    Primary hypertension 03/06/2021    Stage 2 chronic kidney disease 03/06/2021    Stage 2 chronic kidney disease     Thrombocytopenia (HCC) 04/17/2021     Social History     Socioeconomic History    Marital status: Radiation protection practitioner     Spouse name: Not on file    Number of children: Not on file    Years of education: Not on file    Highest education level: Not on file   Occupational History    Not on file   Tobacco Use    Smoking status: Every Day     Packs/day: 0.50     Years: 40.00     Pack years: 20.00     Types: Cigarettes    Smokeless tobacco: Never    Tobacco comments:     Patient down to 5-6 cigarettes a day   Substance and Sexual Activity    Alcohol use: No    Drug  use: No    Sexual activity: Yes     Partners: Male   Other Topics Concern    Not on file   Social History Narrative    Not on file     Social Determinants of Health     Financial Resource Strain: Low Risk     Difficulty of Paying Living Expenses: Not hard at all   Food Insecurity: No Food Insecurity    Worried About Programme researcher, broadcasting/film/video in the Last Year: Never true    Ran Out of Food in the Last Year: Never true   Transportation Needs: Unknown    Lack of Transportation (Medical): Not on file    Lack of Transportation (Non-Medical): No   Physical Activity: Not on file   Stress: Not on file   Social Connections: Not on file   Intimate Partner Violence: Not on file   Housing Stability: Unknown    Unable to Pay for Housing in the Last Year: Not on file  Number of Places Lived in the Last Year: Not on file    Unstable Housing in the Last Year: No         Current Outpatient Medications   Medication Sig    amLODIPine (NORVASC) 10 MG tablet TAKE 1 TABLET BY MOUTH EVERY MORNING    nadolol (CORGARD) 20 MG tablet TAKE 1 TABLET BY MOUTH DAILY    ALPRAZolam (XANAX) 0.5 MG tablet Take 1 tablet by mouth 2 times daily as needed for Sleep or Anxiety for up to 90 days. Max Daily Amount: 1 mg    omeprazole (PRILOSEC) 40 MG delayed release capsule Take 1 capsule by mouth daily    potassium chloride (KLOR-CON M) 10 MEQ extended release tablet Take 1 tablet by mouth 2 times daily    oxyCODONE (ROXICODONE) 5 MG immediate release tablet Take 1 tablet by mouth 2 times daily as needed.    naloxone 4 MG/0.1ML LIQD nasal spray CALL 911. SPR CONTENTS OF ONE SPRAYER (0.1ML) INTO ONE NOSTRIL. REPEAT IN 2-3 MIN IF SYMPTOMS OF OPIOID EMERGENCY PERSIST, ALTERNATE NOSTRILS    ondansetron (ZOFRAN-ODT) 8 MG TBDP disintegrating tablet DISSOLVE 1 TABLET(8 MG) ON THE TONGUE THREE TIMES DAILY FOR UP TO 30 DOSES AS NEEDED FOR NAUSEA OR VOMITING    buPROPion (WELLBUTRIN SR) 150 MG extended release tablet Take 1 tablet by mouth 2 times daily    furosemide  (LASIX) 20 MG tablet Take 1 tablet by mouth daily    fluticasone-salmeterol (ADVAIR DISKUS) 250-50 MCG/ACT AEPB diskus inhaler INHALE 1 PUFF BY MOUTH TWICE DAILY    Respiratory Therapy Supplies (NEBULIZER/TUBING/MOUTHPIECE) KIT 1 kit by Does not apply route daily as needed (as needed for wheezing)    albuterol sulfate HFA (PROVENTIL;VENTOLIN;PROAIR) 108 (90 Base) MCG/ACT inhaler INHALE 1 PUFF BY MOUTH EVERY 4 HOURS AS NEEDED FOR WHEEZING/SOB.    fluticasone-umeclidin-vilant (TRELEGY ELLIPTA) 100-62.5-25 MCG/ACT AEPB inhaler Inhale 1 puff into the lungs daily    lactulose (CHRONULAC) 10 GM/15ML solution TAKE 30 ML BY MOUTH TWICE DAILY    citalopram (CELEXA) 40 MG tablet Take 1 tablet by mouth daily Citalopram 40 mg tablet TAKE 1 TABLET BY MOUTH EVERY MORNING    nicotine (NICODERM CQ) 21 MG/24HR Place 1 patch onto the skin daily    ipratropium-albuterol (DUONEB) 0.5-2.5 (3) MG/3ML SOLN nebulizer solution Inhale 3 mLs into the lungs every 6 hours as needed for Shortness of Breath INHALE CONTENTS OF 1 VIAL VIA NEBULIZER DAILY AS NEEDED FOR WHEEZING    ondansetron (ZOFRAN) 8 MG tablet Take 1 tablet by mouth every 8 hours as needed for Nausea or Vomiting Ondansetron HCl 8 mg tablet    zinc sulfate (ZINCATE) 220 (50 Zn) MG capsule Zinc sulfate 50 mg zinc (220 mg) capsule TAKE ONE CAPSULE BY MOUTH TWICE DAILY WITH MEALS.     No current facility-administered medications for this visit.        Objective     There were no vitals filed for this visit.     Physical Exam:  Constitutional: Appears well kempt. Alert/oriented x3. In no acute distress.  Head: Normocephalic No trauma. No deformity.   Cardiac: Heart with normal rate/rhythm. No murmurs. No gallops. Pulses normal.   Pulmonary: Lungs clear to auscultation bilaterally. In no respiratory distress. No wheezing. No rales. No rhonci.   Psychiatric: Normal thought content. Normal behavior. Normal judgment.     Recommendations     Assessment:  Patient Active Problem List    Diagnosis    Primary hypertension  Anxiety and depression    Obesity (BMI 30.0-34.9)    Cirrhosis of liver without ascites (HCC)    Stage 2 chronic kidney disease    Shortness of breath    Gastroesophageal reflux disease without esophagitis    Decreased visual acuity    Tobacco dependence    Chronic obstructive pulmonary disease, unspecified COPD type (HCC)        Status of above conditions:     Plan:      -Previous medical records and/or labs/tests available to me reviewed, any records outstanding not available requested  -The risks, benefits, and medical necessity of all medications, tests labs, and any other orders that were ordered at today's visit were discussed with the patient including all elective or patient requested orders.  Decision to order or not order tests or based on this risk/benefit ratio and medical necessity.  -The patient expresses understanding of the plan as I've explained it to her and is in agreement with the current plan.    Follow Up:     Total Time: 30 minutes (chart reviewing and in-exam time)    Signed: [place] Maimuna Leaman, D.O.  06/02/22  1:06 PM

## 2022-06-08 ENCOUNTER — Encounter

## 2022-06-09 ENCOUNTER — Encounter

## 2022-06-09 MED ORDER — FLUTICASONE-SALMETEROL 250-50 MCG/ACT IN AEPB
250-50 MCG/ACT | RESPIRATORY_TRACT | 2 refills | Status: AC
Start: 2022-06-09 — End: 2022-10-23

## 2022-06-09 NOTE — Telephone Encounter (Signed)
Appt 07/08/22

## 2022-06-23 NOTE — Telephone Encounter (Signed)
-----   Message from [facility] sent at 06/23/2022  9:52 AM EDT -----  Subject: Appointment Request    Reason for Call: Established Patient Appointment needed: Routine ED Follow   Up Visit    QUESTIONS    Reason for appointment request? No appointments available during search     Additional Information for Provider? pt needs ED fu, trouble breathing and   coughing up stuff, has mold in house and ED said she had heart failure one   time and didnt the next time  ---------------------------------------------------------------------------  --------------  [name] INFO  [phone]; OK to leave message on voicemail  ---------------------------------------------------------------------------  --------------  SCRIPT ANSWERS

## 2022-06-23 NOTE — Telephone Encounter (Signed)
Pt called and stated that she having issues breathing that she needed to be seen . I told patient that Dr Quentin Ore does not have any availability until Aug 10 . I informed pt that she needs to go to urgent care or ER. She stated that she is not getting any better since the last time she was there at the end of June. Patient stated that she will go to the Er

## 2022-06-26 NOTE — Telephone Encounter (Signed)
Patient has appointment on July 08, 2022

## 2022-07-06 ENCOUNTER — Encounter

## 2022-07-07 NOTE — Telephone Encounter (Signed)
Has an appt 07/08/2022. Refills will be sent at that time.

## 2022-07-08 ENCOUNTER — Encounter: Payer: MEDICARE | Attending: Internal Medicine | Primary: Internal Medicine

## 2022-07-08 NOTE — Progress Notes (Unsigned)
[place] Whitley Patchen, D.O.   [facility]  [address]  [place], [place] [address]  Tel: [phone]    Office Visit: Follow Up     Patient Name: Elizabeth Ingram   DOB:  [birth year]   MRN:   [id number]      Today's Date: 07/08/22 7:49 AM    Subjective     The patient is a 60 y.o.-year-old female who presents for follow-up.    Hypertension  -Amlodipine 10 mg daily  -She is on nadolol 20 mg daily for esophageal varices prophylaxis  -Lasix 20 mg daily     Tobacco dependence  -Increase Wellbutrin to 150 mg BID     Anxiety and depression, states this is better   -Xanax 2.5 mg twice daily  -Celexa 40 mg daily  -Wellbutrin 150 mg BID      Cirrhosis of the liver  -MELD-Na score of 9 (02/2021)  -Lasix 50 mg daily  -EGD scheduled for 09/24/2022  -Nadalol (Corgard) daily 20 mg for esophageal varices  -Low-sodium diet  -Lactulose daily   -Patient had ultrasound abdomen right upper quadrant on 02/06/2022 which showed redemonstrated findings of hepatic cirrhosis and portal hypertension, cholelithiasis with a dilated common bile duct but no findings of acute cholecystitis, increased splenomegaly.  -Her next ultrasound is 08/20/2022     Abdominal pain and an unintentional weight loss  -Patient is following with GI  -She has an EGD scheduled for 09/24/2022     GERD  -Omeprazole 40 mg daily  -Patient is following with GI  -She has an EGD scheduled for 09/24/2022     Obesity  -Weight is 167 pounds and BMI is 27.79 today  -she states she lost 13 pounds since last visit and has had a decreased appetite.      Stage III CKD  -Patient following with nephrology  -Check CMP today     Bilateral leg edema  -Lasix as needed for swelling but patient is also taking this daily for her cirrhosis  -Patient states this is improved greatly, continue current management     COPD  -Trelegy, patient has experienced a lot of relief with the Trelegy  -Albuterol as needed  -Duonebs as needed     Chronic Low back Pain  -She recently saw pain  management, they started her on oxycodone 5 mg; she has follow up next week; she would like to go back on Tramadol  -Follow-up with pain management     Health maintenance  -Due for colonoscopy in 2029  -Due for mammogram in 2024  -Low-dose CAT ordered  -Annual wellness visit due in June 2023  -Due for Pap smear in 2027    Today:     Review of Systems     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)     Cirrhosis of liver without ascites (HCC) 03/06/2021    Hypokalemia 10/09/2021    Nausea 05/20/2021    Obesity (BMI 30.0-34.9) 03/06/2021    Primary hypertension 03/06/2021    Stage 2 chronic kidney disease 03/06/2021    Stage 2 chronic kidney disease     Thrombocytopenia (HCC) 04/17/2021     Social History     Socioeconomic History    Marital status: Radiation protection practitioner     Spouse name: Not on file    Number of children: Not  on file    Years of education: Not on file    Highest education level: Not on file   Occupational History    Not on file   Tobacco Use    Smoking status: Every Day     Packs/day: 0.50     Years: 40.00     Pack years: 20.00     Types: Cigarettes    Smokeless tobacco: Never    Tobacco comments:     Patient down to 5-6 cigarettes a day   Substance and Sexual Activity    Alcohol use: No    Drug use: No    Sexual activity: Yes     Partners: Male   Other Topics Concern    Not on file   Social History Narrative    Not on file     Social Determinants of Health     Financial Resource Strain: Low Risk     Difficulty of Paying Living Expenses: Not hard at all   Food Insecurity: No Food Insecurity    Worried About Programme researcher, broadcasting/film/video in the Last Year: Never true    Ran Out of Food in the Last Year: Never true   Transportation Needs: Unknown    Lack of Transportation (Medical): Not on file    Lack of Transportation (Non-Medical): No   Physical Activity: Not on file   Stress: Not on file   Social Connections: Not  on file   Intimate Partner Violence: Not on file   Housing Stability: Unknown    Unable to Pay for Housing in the Last Year: Not on file    Number of Places Lived in the Last Year: Not on file    Unstable Housing in the Last Year: No         Current Outpatient Medications   Medication Sig    fluticasone-salmeterol (ADVAIR) 250-50 MCG/ACT AEPB diskus inhaler INHALE 1 PUFF BY MOUTH TWICE DAILY    amLODIPine (NORVASC) 10 MG tablet TAKE 1 TABLET BY MOUTH EVERY MORNING    nadolol (CORGARD) 20 MG tablet TAKE 1 TABLET BY MOUTH DAILY    omeprazole (PRILOSEC) 40 MG delayed release capsule Take 1 capsule by mouth daily    potassium chloride (KLOR-CON M) 10 MEQ extended release tablet Take 1 tablet by mouth 2 times daily    oxyCODONE (ROXICODONE) 5 MG immediate release tablet Take 1 tablet by mouth 2 times daily as needed.    naloxone 4 MG/0.1ML LIQD nasal spray CALL 911. SPR CONTENTS OF ONE SPRAYER (0.1ML) INTO ONE NOSTRIL. REPEAT IN 2-3 MIN IF SYMPTOMS OF OPIOID EMERGENCY PERSIST, ALTERNATE NOSTRILS    ondansetron (ZOFRAN-ODT) 8 MG TBDP disintegrating tablet DISSOLVE 1 TABLET(8 MG) ON THE TONGUE THREE TIMES DAILY FOR UP TO 30 DOSES AS NEEDED FOR NAUSEA OR VOMITING    buPROPion (WELLBUTRIN SR) 150 MG extended release tablet Take 1 tablet by mouth 2 times daily    furosemide (LASIX) 20 MG tablet Take 1 tablet by mouth daily    Respiratory Therapy Supplies (NEBULIZER/TUBING/MOUTHPIECE) KIT 1 kit by Does not apply route daily as needed (as needed for wheezing)    albuterol sulfate HFA (PROVENTIL;VENTOLIN;PROAIR) 108 (90 Base) MCG/ACT inhaler INHALE 1 PUFF BY MOUTH EVERY 4 HOURS AS NEEDED FOR WHEEZING/SOB.    fluticasone-umeclidin-vilant (TRELEGY ELLIPTA) 100-62.5-25 MCG/ACT AEPB inhaler Inhale 1 puff into the lungs daily    lactulose (CHRONULAC) 10 GM/15ML solution TAKE 30 ML BY MOUTH TWICE DAILY    citalopram (CELEXA) 40 MG tablet  Take 1 tablet by mouth daily Citalopram 40 mg tablet TAKE 1 TABLET BY MOUTH EVERY MORNING     nicotine (NICODERM CQ) 21 MG/24HR Place 1 patch onto the skin daily    ipratropium-albuterol (DUONEB) 0.5-2.5 (3) MG/3ML SOLN nebulizer solution Inhale 3 mLs into the lungs every 6 hours as needed for Shortness of Breath INHALE CONTENTS OF 1 VIAL VIA NEBULIZER DAILY AS NEEDED FOR WHEEZING    ondansetron (ZOFRAN) 8 MG tablet Take 1 tablet by mouth every 8 hours as needed for Nausea or Vomiting Ondansetron HCl 8 mg tablet    zinc sulfate (ZINCATE) 220 (50 Zn) MG capsule Zinc sulfate 50 mg zinc (220 mg) capsule TAKE ONE CAPSULE BY MOUTH TWICE DAILY WITH MEALS.     No current facility-administered medications for this visit.        Objective     There were no vitals filed for this visit.     Physical Exam:  Constitutional: Appears well kempt. Alert/oriented x3. In no acute distress.  Head: Normocephalic No trauma. No deformity.   Cardiac: Heart with normal rate/rhythm. No murmurs. No gallops. Pulses normal.   Pulmonary: Lungs clear to auscultation bilaterally. In no respiratory distress. No wheezing. No rales. No rhonci.   Psychiatric: Normal thought content. Normal behavior. Normal judgment.     Recommendations     Assessment:  Patient Active Problem List   Diagnosis    Primary hypertension    Anxiety and depression    Obesity (BMI 30.0-34.9)    Cirrhosis of liver without ascites (HCC)    Stage 2 chronic kidney disease    Shortness of breath    Gastroesophageal reflux disease without esophagitis    Decreased visual acuity    Tobacco dependence    Chronic obstructive pulmonary disease, unspecified COPD type (HCC)        Status of above conditions:     Plan:      -Previous medical records and/or labs/tests available to me reviewed, any records outstanding not available requested  -The risks, benefits, and medical necessity of all medications, tests labs, and any other orders that were ordered at today's visit were discussed with the patient including all elective or patient requested orders.  Decision to order or not  order tests or based on this risk/benefit ratio and medical necessity.  -The patient expresses understanding of the plan as I've explained it to her and is in agreement with the current plan.    Follow Up:     Total Time: 30 minutes (chart reviewing and in-exam time)    Signed: [place] Annastyn Silvey, D.O.  07/08/22  7:49 AM

## 2022-07-09 ENCOUNTER — Encounter

## 2022-07-09 ENCOUNTER — Ambulatory Visit: Admit: 2022-07-09 | Discharge: 2022-07-09 | Payer: MEDICARE | Attending: Internal Medicine | Primary: Internal Medicine

## 2022-07-09 DIAGNOSIS — R600 Localized edema: Secondary | ICD-10-CM

## 2022-07-09 MED ORDER — ONDANSETRON 8 MG PO TBDP
8 MG | ORAL_TABLET | ORAL | 3 refills | Status: AC
Start: 2022-07-09 — End: 2022-10-23

## 2022-07-09 MED ORDER — TRELEGY ELLIPTA 100-62.5-25 MCG/ACT IN AEPB
Freq: Every day | RESPIRATORY_TRACT | 3 refills | Status: AC
Start: 2022-07-09 — End: 2022-10-23

## 2022-07-09 MED ORDER — POTASSIUM CHLORIDE CRYS ER 10 MEQ PO TBCR
10 MEQ | ORAL_TABLET | Freq: Two times a day (BID) | ORAL | 1 refills | Status: AC
Start: 2022-07-09 — End: 2022-10-23

## 2022-07-09 MED ORDER — IPRATROPIUM-ALBUTEROL 0.5-2.5 (3) MG/3ML IN SOLN
Freq: Four times a day (QID) | RESPIRATORY_TRACT | 2 refills | Status: AC | PRN
Start: 2022-07-09 — End: 2022-10-23

## 2022-07-09 MED ORDER — BUPROPION HCL ER (SR) 150 MG PO TB12
150 MG | ORAL_TABLET | Freq: Two times a day (BID) | ORAL | 0 refills | Status: AC
Start: 2022-07-09 — End: 2022-10-07

## 2022-07-09 MED ORDER — FUROSEMIDE 20 MG PO TABS
20 MG | ORAL_TABLET | Freq: Every day | ORAL | 2 refills | Status: DC
Start: 2022-07-09 — End: 2022-10-23

## 2022-07-09 MED ORDER — ALBUTEROL SULFATE HFA 108 (90 BASE) MCG/ACT IN AERS
108 (90 Base) MCG/ACT | RESPIRATORY_TRACT | 3 refills | Status: AC
Start: 2022-07-09 — End: 2022-10-23

## 2022-07-09 MED ORDER — ALPRAZOLAM 0.5 MG PO TABS
0.5 MG | ORAL_TABLET | Freq: Two times a day (BID) | ORAL | 2 refills | Status: AC
Start: 2022-07-09 — End: 2022-10-07

## 2022-07-09 MED ORDER — OMEPRAZOLE 40 MG PO CPDR
40 MG | ORAL_CAPSULE | Freq: Every day | ORAL | 1 refills | Status: AC
Start: 2022-07-09 — End: 2023-01-05

## 2022-07-09 NOTE — Progress Notes (Signed)
[place] Zakkary Thibault, D.O.   [facility]  [place], [place]  Tel: [phone]    Office Visit: Follow Up     Patient Name: Elizabeth Ingram   DOB:  1961-09-26   MRN:   [id number]      Today's Date: 07/09/22 9:17 AM    Subjective     The patient is a 61 y.o.-year-old female who presents for follow-up.    Hypertension  -Amlodipine 10 mg daily  -She is on nadolol 20 mg daily for esophageal varices prophylaxis  -Lasix 20 mg daily     Tobacco dependence  -Wellbutrin to 150 mg BID  -she states she is still smoking half a pack a day   -she has been to the ER twice in the last 2 months for COPD exacerbations      Anxiety and depression, well controlled   -Xanax 2.5 mg twice daily  -Celexa 40 mg daily  -Wellbutrin 150 mg BID      Cirrhosis of the liver  -MELD-Na score of 9 (02/2021)  -Lasix 20 mg daily  -Spironolactone 50 mg daily  -EGD scheduled for 05/14/2022  -Nadalol (Corgard) daily 20 mg for esophageal varices  -Low-sodium diet  -Lactulose daily   -Patient had telemedicine visit with her GI doctor at [facility] on 04/18/2022, at this visit it was noted that she has stopped taking her lactulose 4 days prior and was no longer taking her Lasix. She was not drinking alcohol at this time.  Her ultrasound that was scheduled for September was rescheduled to be done earlier since her bilirubin was high at 1.5.  She will be checked for [facility] screening with AFP and ultrasound annually.  -She states she did her [place] of her liver 2-3 months ago  -She follows up with GI next week   -She states she is taking her lactulose and Lasix daily   -Patient has Colonoscopy EGD scheduled for 09/25/2022     GERD, well controlled   -Omeprazole 40 mg daily, she is taking this as needed     Obesity  -Weight is 152 pounds and BMI is 25.29 today  -she states she is still eating healthy, she states she is still losing weight and is going to speak to GI next week about this      Stage III CKD  -Patient following with  nephrology; she had an appointment last month but cancelled it due to being sick, she will call to reschedule this today   -GFR 52 and creatinine 1.20 on 04/07/2022, these were stable     Bilateral leg edema  -Lasix as needed for swelling but patient is also taking this daily for her cirrhosis  -Patient states this is improved greatly     COPD  -Trelegy 100-60 2.5-25 mcg/ACT 1 puff daily; states her pharmacy took her off this and put her back on Advair, they told her Trelegy is dangerous, she is back on Advair now   -Albuterol as needed  -Duonebs as needed     Chronic Low back Pain  -She recently saw pain management, they started her on oxycodone 5 mg; she has follow up next week; she would like to go back on Tramadol  -Follow-up with pain management     Health maintenance  -Due for colonoscopy in 2029  -Due for mammogram in 2024  -Low-dose CAT ordered  -Annual wellness visit due in June 2023  -Due for Pap smear in 2027  Today:   Patient states she has been getting bug bites for the past 2 weeks all over her body.  She states they are mostly on her legs but she also has some on her arms and on her neck and back.  She is not sure what is biting her.  She states the bug bites will itch slightly and fill fluid and then drain and then heal.    Review of Systems   All other systems reviewed and are negative.     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    Stage 2 chronic kidney disease     Thrombocytopenia ([place]) 04/17/2021     Social History     Socioeconomic History    Marital status: Social worker     Spouse name: Not on file    Number of children: Not on file    Years of education: Not on file     Highest education level: Not on file   Occupational History    Not on file   Tobacco Use    Smoking status: Every Day     Packs/day: 0.50     Years: 40.00     Pack years: 20.00     Types: Cigarettes    Smokeless tobacco: Never    Tobacco comments:     Patient down to 5-6 cigarettes a day   Substance and Sexual Activity    Alcohol use: No    Drug use: No    Sexual activity: Yes     Partners: Male   Other Topics Concern    Not on file   Social History Narrative    Not on file     Social Determinants of Health     Financial Resource Strain: Low Risk     Difficulty of Paying Living Expenses: Not hard at all   Food Insecurity: No Food Insecurity    Worried About Charity fundraiser in the Last Year: Never true    Ran Out of Food in the Last Year: Never true   Transportation Needs: Unknown    Lack of Transportation (Medical): Not on file    Lack of Transportation (Non-Medical): No   Physical Activity: Not on file   Stress: Not on file   Social Connections: Not on file   Intimate Partner Violence: Not on file   Housing Stability: Unknown    Unable to Pay for Housing in the Last Year: Not on file    Number of Places Lived in the Last Year: Not on file    Unstable Housing in the Last Year: No         Current Outpatient Medications   Medication Sig    omeprazole (PRILOSEC) 40 MG delayed release capsule Take 1 capsule by mouth daily    ondansetron (ZOFRAN-ODT) 8 MG TBDP disintegrating tablet DISSOLVE 1 TABLET(8 MG) ON THE TONGUE THREE TIMES DAILY FOR UP TO 30 DOSES AS NEEDED FOR NAUSEA OR VOMITING    buPROPion (WELLBUTRIN SR) 150 MG extended release tablet Take 1 tablet by mouth 2 times daily  furosemide (LASIX) 20 MG tablet Take 1 tablet by mouth daily    potassium chloride (KLOR-CON M) 10 MEQ extended release tablet Take 1 tablet by mouth 2 times daily    albuterol sulfate HFA (PROVENTIL;VENTOLIN;PROAIR) 108 (90 Base) MCG/ACT inhaler INHALE 1 PUFF BY MOUTH EVERY 4 HOURS AS NEEDED FOR WHEEZING/SOB.     fluticasone-umeclidin-vilant (TRELEGY ELLIPTA) 100-62.5-25 MCG/ACT AEPB inhaler Inhale 1 puff into the lungs daily    ipratropium 0.5 mg-albuterol 2.5 mg (DUONEB) 0.5-2.5 (3) MG/3ML SOLN nebulizer solution Inhale 3 mLs into the lungs every 6 hours as needed for Shortness of Breath INHALE CONTENTS OF 1 VIAL VIA NEBULIZER DAILY AS NEEDED FOR WHEEZING    ALPRAZolam (XANAX) 0.25 MG tablet Take 1 tablet by mouth daily. Max Daily Amount: 0.25 mg    fluticasone-salmeterol (ADVAIR) 250-50 MCG/ACT AEPB diskus inhaler INHALE 1 PUFF BY MOUTH TWICE DAILY    amLODIPine (NORVASC) 10 MG tablet TAKE 1 TABLET BY MOUTH EVERY MORNING    nadolol (CORGARD) 20 MG tablet TAKE 1 TABLET BY MOUTH DAILY    oxyCODONE (ROXICODONE) 5 MG immediate release tablet Take 1 tablet by mouth 2 times daily as needed.    naloxone 4 MG/0.1ML LIQD nasal spray CALL 911. SPR CONTENTS OF ONE SPRAYER (0.1ML) INTO ONE NOSTRIL. REPEAT IN 2-3 MIN IF SYMPTOMS OF OPIOID EMERGENCY PERSIST, ALTERNATE NOSTRILS    Respiratory Therapy Supplies (NEBULIZER/TUBING/MOUTHPIECE) KIT 1 kit by Does not apply route daily as needed (as needed for wheezing)    lactulose (CHRONULAC) 10 GM/15ML solution TAKE 30 ML BY MOUTH TWICE DAILY    citalopram (CELEXA) 40 MG tablet Take 1 tablet by mouth daily Citalopram 40 mg tablet TAKE 1 TABLET BY MOUTH EVERY MORNING    nicotine (NICODERM CQ) 21 MG/24HR Place 1 patch onto the skin daily    ondansetron (ZOFRAN) 8 MG tablet Take 1 tablet by mouth every 8 hours as needed for Nausea or Vomiting Ondansetron HCl 8 mg tablet    zinc sulfate (ZINCATE) 220 (50 Zn) MG capsule Zinc sulfate 50 mg zinc (220 mg) capsule TAKE ONE CAPSULE BY MOUTH TWICE DAILY WITH MEALS.    nystatin (MYCOSTATIN) 100000 UNIT/ML suspension      No current facility-administered medications for this visit.        Objective     Vitals:    07/09/22 0848   BP: (!) 141/76   Site: Left Upper Arm   Position: Standing   Cuff Size: Small Adult   Pulse: 71   Resp: 12   Temp: 98.6 F (37  C)   TempSrc: Temporal   SpO2: 96%   Weight: 152 lb (68.9 kg)   Height: 5' 5" (1.651 m)        Physical Exam:  Constitutional: Appears well kempt. Alert/oriented x3. In no acute distress.  Head: Normocephalic No trauma. No deformity.   Cardiac: Heart with normal rate/rhythm. + murmurs. No gallops. Pulses normal.   Pulmonary: Lungs clear to auscultation bilaterally. In no respiratory distress. No wheezing. No rales. No rhonci.   Skin: Multiple healing bug bites.  Patient scratched one and pulled the scab off which was bleeding at this visit.  No signs of infection of the bug bites.  No rashes present.  Psychiatric: Normal thought content. Normal behavior. Normal judgment.     Recommendations     Assessment:  Patient Active Problem List   Diagnosis    Primary hypertension    Anxiety and depression    Obesity (BMI 30.0-34.9)  Cirrhosis of liver without ascites (HCC)    Stage 2 chronic kidney disease    Shortness of breath    Gastroesophageal reflux disease without esophagitis    Decreased visual acuity    Tobacco dependence    Chronic obstructive pulmonary disease, unspecified COPD type (HCC)      Status of above conditions: Blood pressure slightly elevated    Plan:  Hypertension  -Amlodipine 10 mg daily  -She is on nadolol 20 mg daily for esophageal varices prophylaxis  -Lasix 20 mg daily     Tobacco dependence  -Wellbutrin to 150 mg BID  -she states she is still smoking half a pack a day   -Encouraged patient to continue smoking cessation efforts.  I advised patient that if she continues to smoke she will continue to have shortness of breath and trouble breathing and COPD exacerbations requiring hospitalization.     Anxiety and depression, well controlled   -Xanax 2.5 mg twice daily  -Celexa 40 mg daily  -Wellbutrin 150 mg BID      Cirrhosis of the liver  -MELD-Na score of 9 (02/2021)  -Lasix 20 mg daily  -Spironolactone 50 mg daily  -EGD scheduled for 05/14/2022  -Nadalol (Corgard) daily 20 mg for esophageal  varices  -Low-sodium diet  -Lactulose daily   -She follows up with GI next week   -Patient has Colonoscopy EGD scheduled for 09/25/2022     GERD, well controlled   -Omeprazole 40 mg daily, she is taking this as needed     Obesity  -Weight is 152 pounds and BMI is 25.29 today  -she states she is still eating healthy, she states she is still losing weight and is going to speak to GI next week about this      Stage III CKD  -Patient following with nephrology; she had an appointment last month but cancelled it due to being sick, she will call to reschedule this today   -Check CMP today     Bilateral leg edema  -Lasix as needed for swelling but patient is also taking this daily for her cirrhosis  -Patient states this is improved greatly  -Check BMP     COPD  -Trelegy 100-60 2.5-25 mcg/ACT 1 puff daily; states her pharmacy took her off this and put her back on Advair, they told her Trelegy is dangerous, she is back on Advair now   -Referral to pulmonology  -Albuterol as needed  -Duonebs as needed     Chronic Low back Pain  -She recently saw pain management, they started her on oxycodone 5 mg; she has follow up next week; she would like to go back on Tramadol  -Follow-up with pain management    Heart murmur  -Referral to cardiology for echocardiogram    Bug bites  -Referral to dermatology     Health maintenance  -Due for colonoscopy in 2029  -Due for mammogram in 2024  -Low-dose CAT ordered  -Annual wellness visit due in June 2023  -Due for Pap smear in 2027    -Previous medical records and/or labs/tests available to me reviewed, any records outstanding not available requested  -The risks, benefits, and medical necessity of all medications, tests labs, and any other orders that were ordered at today's visit were discussed with the patient including all elective or patient requested orders.  Decision to order or not order tests or based on this risk/benefit ratio and medical necessity.  -The patient expresses understanding  of the plan as I've explained  it to her and is in agreement with the current plan.    Follow Up: 3 months    Total Time: 30 minutes (chart reviewing and in-exam time)    Signed: [place] Chrisa Hassan, D.O.  07/09/22  9:17 AM

## 2022-07-09 NOTE — Telephone Encounter (Signed)
-----   Message from [name] sent at 07/09/2022 11:59 AM EDT -----  Subject: Refill Request    QUESTIONS  Name of Medication? ALPRAZolam (XANAX) 0.5 MG tablet  Patient-reported dosage and instructions? Take 1 tablet by mouth 2 times   daily as needed for Sleep or Anxiety for up to 90 days. Max Daily Amount?   1 mg  How many days do you have left? 0  Preferred Pharmacy? [facility] [phone]  Pharmacy phone number (if available)? [phone]  Additional Information for Provider? Elizabeth Ingram is completely out. She   states that she forgot to request refill at appt. Please f/u with her once   confirmed and sent to pharmacy.   ---------------------------------------------------------------------------  --------------  [name] INFO  What is the best way for the office to contact you? OK to leave message on   voicemail,OK to respond with electronic message via MyChart portal (only   for patients who have registered MyChart account)  Preferred Call Back Phone Number? [phone]  ---------------------------------------------------------------------------  --------------  SCRIPT ANSWERS  Relationship to Patient? Self

## 2022-07-09 NOTE — Telephone Encounter (Signed)
Patient states that the dosage on her prescription is not correct. She says that when you send it in it should be .5 instead of 0.25    ALPRAZolam (XANAX) 0.25 MG tablet        [id number] [place], [place] - [address] - P [phone] - F [phone]

## 2022-07-10 LAB — CBC
Hematocrit: 41.4 % (ref 35.8–46.3)
Hemoglobin: 13.6 g/dL (ref 11.7–15.4)
MCH: 30.8 PG (ref 26.1–32.9)
MCHC: 32.9 g/dL (ref 31.4–35.0)
MCV: 93.7 FL (ref 82–102)
MPV: 12.1 FL (ref 9.4–12.3)
Platelets: 119 10*3/uL — ABNORMAL LOW (ref 150–450)
RBC: 4.42 M/uL (ref 4.05–5.2)
RDW: 14.9 % — ABNORMAL HIGH (ref 11.9–14.6)
WBC: 10 10*3/uL (ref 4.3–11.1)
nRBC: 0 10*3/uL (ref 0.0–0.2)

## 2022-07-10 LAB — COMPREHENSIVE METABOLIC PANEL
ALT: 16 U/L (ref 12–65)
AST: 36 U/L (ref 15–37)
Albumin/Globulin Ratio: 0.8 (ref 0.4–1.6)
Albumin: 3.5 g/dL (ref 3.5–5.0)
Alk Phosphatase: 153 U/L — ABNORMAL HIGH (ref 50–136)
Anion Gap: 7 mmol/L (ref 2–11)
BUN: 6 MG/DL (ref 6–23)
CO2: 26 mmol/L (ref 21–32)
Calcium: 9.1 MG/DL (ref 8.3–10.4)
Chloride: 109 mmol/L (ref 101–110)
Creatinine: 1 MG/DL (ref 0.6–1.0)
Est, Glom Filt Rate: >60 mL/min/{1.73_m2} (ref >60)
Globulin: 4.3 g/dL (ref 2.8–4.5)
Glucose: 108 mg/dL — ABNORMAL HIGH (ref 65–100)
Potassium: 3.1 mmol/L — ABNORMAL LOW (ref 3.5–5.1)
Sodium: 142 mmol/L (ref 133–143)
Total Bilirubin: 1.3 MG/DL — ABNORMAL HIGH (ref 0.2–1.1)
Total Protein: 7.8 g/dL (ref 6.3–8.2)

## 2022-07-10 LAB — BRAIN NATRIURETIC PEPTIDE: NT Pro-BNP: 68 PG/ML (ref 5–125)

## 2022-07-10 LAB — BILIRUBIN, DIRECT: Bilirubin, Direct: 0.4 MG/DL — ABNORMAL HIGH (ref <0.4)

## 2022-07-10 NOTE — Telephone Encounter (Signed)
Last appt 07/09/2022  Next appt 10/09/2022    Celexa last sent 10/08/2021 30 with 2 rf.    Pending #90 with 0 rfs to last until next appt

## 2022-07-10 NOTE — Telephone Encounter (Signed)
-----   Message from Shade Flood sent at 07/10/2022 12:29 PM EDT -----  Subject: Medication Problem    Medication: citalopram (CELEXA) 40 MG tablet  Dosage: Once a day   Ordering Provider: Gr[place], [place]     Question/Problem: Pt was told by pharmacy that she needed to see pcp for   med to be refilled but she was in on 07/09/2022. Pt would like contacted if   any issues. Thank you      Pharmacy: [facility] [address], [place] - [address]   - P [phone] Carmon Ginsberg [phone]    ---------------------------------------------------------------------------  --------------  [name] INFO  [phone]; OK to leave message on voicemail  ---------------------------------------------------------------------------  --------------    SCRIPT ANSWERS  Relationship to Patient: Self

## 2022-07-11 MED ORDER — CITALOPRAM HYDROBROMIDE 40 MG PO TABS
40 MG | ORAL_TABLET | Freq: Every day | ORAL | 0 refills | Status: DC
Start: 2022-07-11 — End: 2022-10-23

## 2022-08-08 ENCOUNTER — Encounter: Payer: MEDICARE | Attending: Cardiovascular Disease | Primary: Internal Medicine

## 2022-08-08 NOTE — Progress Notes (Unsigned)
[facility] History & Physical                 Reason for Visit: Cardiac murmur    Subjective:     Patient is a 61 y.o. female with a PMH of hypertension, COPD, HCV, and cirrhosis who presents as a referral for cardiac murmur.    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)     Cirrhosis of liver without ascites (HCC) 03/06/2021    Hypokalemia 10/09/2021    Nausea 05/20/2021    Obesity (BMI 30.0-34.9) 03/06/2021    Primary hypertension 03/06/2021    Stage 2 chronic kidney disease 03/06/2021    Stage 2 chronic kidney disease     Thrombocytopenia (HCC) 04/17/2021      Past Surgical History:   Procedure Laterality Date    NEUROLOGICAL SURGERY      nerve in left arm surgery 2009    ORTHOPEDIC SURGERY      x2 back     TUBAL LIGATION        Family History   Problem Relation Age of Onset    Colon Cancer Neg Hx     Breast Cancer Neg Hx     Ovarian Cancer Neg Hx       Social History     Tobacco Use    Smoking status: Every Day     Packs/day: 0.50     Years: 40.00     Pack years: 20.00     Types: Cigarettes    Smokeless tobacco: Never    Tobacco comments:     Patient down to 5-6 cigarettes a day   Substance Use Topics    Alcohol use: No      Allergies   Allergen Reactions    Gabapentin Other (See Comments)     Pt reports decreased respirations while taking this med         ROS:  No obvious pertinent positives on review of systems except for what was outlined above.       Objective:       There were no vitals taken for this visit.    BP Readings from Last 3 Encounters:   07/09/22 (!) 141/76   04/07/22 125/65   01/08/22 121/65       Wt Readings from Last 3 Encounters:   07/09/22 152 lb (68.9 kg)   04/07/22 167 lb (75.8 kg)   03/17/22 180 lb (81.6 kg)       General/Constitutional:   Alert and oriented x 3, no acute distress  HEENT:   normocephalic, atraumatic, moist mucous membranes  Neck:   No JVD or carotid bruits  bilaterally  Cardiovascular:   regular rate and rhythm, no rub/gallop appreciated  Pulmonary:   clear to auscultation bilaterally, no respiratory distress  Abdomen:   soft, non-tender, non-distended  Ext:   No sig LE edema bilaterally  Skin:  warm and dry, no obvious rashes seen  Neuro:   no obvious sensory or motor deficits  Psychiatric:   normal mood and affect      ECG: ***    Data Review:   Lab Results   Component Value Date    CHOL 137 03/06/2021     Lab Results   Component Value Date    TRIG 89 03/06/2021     Lab Results  Component Value Date    HDL 42 03/06/2021     Lab Results   Component Value Date    LDLCALC 78 03/06/2021     Lab Results   Component Value Date    VLDL 17 03/06/2021     No results found for: [facility]     Lab Results   Component Value Date/Time    NA 142 07/09/2022 09:27 AM    NA 139 04/07/2022 03:25 PM    NA 142 01/08/2022 02:10 PM    K 3.1 07/09/2022 09:27 AM    K 3.7 04/07/2022 03:25 PM    K 3.3 01/08/2022 02:10 PM    CL 109 07/09/2022 09:27 AM    CL 109 04/07/2022 03:25 PM    CL 106 01/08/2022 02:10 PM    CO2 26 07/09/2022 09:27 AM    CO2 28 04/07/2022 03:25 PM    CO2 32 01/08/2022 02:10 PM    BUN 6 07/09/2022 09:27 AM    BUN 8 04/07/2022 03:25 PM    BUN 9 01/08/2022 02:10 PM    CREATININE 1.00 07/09/2022 09:27 AM    CREATININE 1.20 04/07/2022 03:25 PM    CREATININE 1.10 01/08/2022 02:10 PM    GLUCOSE 108 07/09/2022 09:27 AM    GLUCOSE 91 04/07/2022 03:25 PM    GLUCOSE 88 01/08/2022 02:10 PM    CALCIUM 9.1 07/09/2022 09:27 AM    CALCIUM 9.2 04/07/2022 03:25 PM    CALCIUM 9.0 01/08/2022 02:10 PM         Lab Results   Component Value Date    ALT 16 07/09/2022    ALT 10 (L) 04/07/2022    ALT 14 01/08/2022    AST 36 07/09/2022    AST 31 04/07/2022    AST 35 01/08/2022        Assessment/Plan:   There are no diagnoses linked to this encounter.    F/U: ***    Jillyn Hidden, MD

## 2022-09-02 ENCOUNTER — Encounter

## 2022-09-02 MED ORDER — AMLODIPINE BESYLATE 10 MG PO TABS
10 MG | ORAL_TABLET | ORAL | 0 refills | Status: AC
Start: 2022-09-02 — End: 2022-10-23

## 2022-09-02 NOTE — Telephone Encounter (Signed)
As of last office note (07/09/2022), patient is still taking this medication and will be out of medication by their appointment (10/09/2022). Pended medication has correct quantity and pended to preferred pharmacy.

## 2022-09-04 ENCOUNTER — Encounter: Payer: MEDICARE | Attending: Family | Primary: Internal Medicine

## 2022-09-05 ENCOUNTER — Encounter
Admit: 2022-09-05 | Discharge: 2022-09-05 | Payer: MEDICARE | Attending: Cardiovascular Disease | Primary: Internal Medicine

## 2022-09-05 DIAGNOSIS — I10 Essential (primary) hypertension: Secondary | ICD-10-CM

## 2022-09-05 NOTE — Progress Notes (Signed)
[facility] History & Physical                 Reason for Visit: Cardiac murmur    Subjective:     Patient is a 61 y.o. female with a PMH of hypertension, substance abuse, cirrhosis, COPD, CKD stage III, and HCV who presents as a referral for cardiac murmur.  The patient reports occasional chest "tightness".  The patient does not report any alleviating or aggravating factors for the chest pain when it occurs.    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)     Cirrhosis of liver without ascites (HCC) 03/06/2021    Hypokalemia 10/09/2021    Nausea 05/20/2021    Obesity (BMI 30.0-34.9) 03/06/2021    Primary hypertension 03/06/2021    Stage 2 chronic kidney disease 03/06/2021    Stage 2 chronic kidney disease     Thrombocytopenia (HCC) 04/17/2021      Past Surgical History:   Procedure Laterality Date    NEUROLOGICAL SURGERY      nerve in left arm surgery 2009    ORTHOPEDIC SURGERY      x2 back     TUBAL LIGATION        Family History   Problem Relation Age of Onset    Colon Cancer Neg Hx     Breast Cancer Neg Hx     Ovarian Cancer Neg Hx       Social History     Tobacco Use    Smoking status: Every Day     Packs/day: 0.50     Years: 40.00     Additional pack years: 0.00     Total pack years: 20.00     Types: Cigarettes    Smokeless tobacco: Never    Tobacco comments:     Patient down to 5-6 cigarettes a day   Substance Use Topics    Alcohol use: No      Allergies   Allergen Reactions    Gabapentin Other (See Comments)     Pt reports decreased respirations while taking this med         ROS:  No obvious pertinent positives on review of systems except for what was outlined above.       Objective:       BP (!) 144/70   Pulse 55   Ht 5\' 5"  (1.651 m)   Wt 145 lb (65.8 kg)   BMI 24.13 kg/m     BP Readings from Last 3 Encounters:   09/05/22 (!) 144/70   07/09/22 (!) 141/76   04/07/22 125/65       Wt Readings  from Last 3 Encounters:   09/05/22 145 lb (65.8 kg)   07/09/22 152 lb (68.9 kg)   04/07/22 167 lb (75.8 kg)       General/Constitutional:   Alert and oriented x 3, no acute distress  HEENT:   normocephalic, atraumatic, moist mucous membranes  Neck:   No JVD or carotid bruits bilaterally  Cardiovascular:   regular rate and rhythm, no rub/gallop appreciated  Pulmonary:   clear to auscultation bilaterally, no respiratory distress  Abdomen:   soft, non-tender, non-distended  Ext:   No sig LE edema bilaterally  Skin:  warm and dry, no obvious rashes seen  Neuro:   no obvious sensory or motor deficits  Psychiatric:   normal mood and affect    ECG:   Sinus bradycardia  RAE  Nonspecific ST and/or T wave abnormalities  Heart rate 55 bpm    Data Review:   Lab Results   Component Value Date    CHOL 137 03/06/2021     Lab Results   Component Value Date    TRIG 89 03/06/2021     Lab Results   Component Value Date    HDL 42 03/06/2021     Lab Results   Component Value Date    LDLCALC 78 03/06/2021     Lab Results   Component Value Date    VLDL 17 03/06/2021     No results found for: "CHOLHDLRATIO"     Lab Results   Component Value Date/Time    NA 142 07/09/2022 09:27 AM    NA 139 04/07/2022 03:25 PM    NA 142 01/08/2022 02:10 PM    K 3.1 07/09/2022 09:27 AM    K 3.7 04/07/2022 03:25 PM    K 3.3 01/08/2022 02:10 PM    CL 109 07/09/2022 09:27 AM    CL 109 04/07/2022 03:25 PM    CL 106 01/08/2022 02:10 PM    CO2 26 07/09/2022 09:27 AM    CO2 28 04/07/2022 03:25 PM    CO2 32 01/08/2022 02:10 PM    BUN 6 07/09/2022 09:27 AM    BUN 8 04/07/2022 03:25 PM    BUN 9 01/08/2022 02:10 PM    CREATININE 1.00 07/09/2022 09:27 AM    CREATININE 1.20 04/07/2022 03:25 PM    CREATININE 1.10 01/08/2022 02:10 PM    GLUCOSE 108 07/09/2022 09:27 AM    GLUCOSE 91 04/07/2022 03:25 PM    GLUCOSE 88 01/08/2022 02:10 PM    CALCIUM 9.1 07/09/2022 09:27 AM    CALCIUM 9.2 04/07/2022 03:25 PM    CALCIUM 9.0 01/08/2022 02:10 PM         Lab Results   Component  Value Date    ALT 16 07/09/2022    ALT 10 (L) 04/07/2022    ALT 14 01/08/2022    AST 36 07/09/2022    AST 31 04/07/2022    AST 35 01/08/2022        Assessment/Plan:   1. Hypertension, unspecified type  - Well controlled  - Continue with amlodipine  - Currently on nadolol in the setting of cirrhosis    2. Hepatic cirrhosis, unspecified hepatic cirrhosis type, unspecified whether ascites present ([place])  - GI note reviewed  - Continue to follow up with GI  - Currently on PO Lasix, lactulose and nadolol    3. Atypical chest pain  - Obtain an ESE   - PE unlikely per PE Wells score     4. Abnormal ECG  - RAE noted on ECG today in clinic  - Obtain a full echocardiogram as part of the stress test    F/U: After testing    Hoyle Sauer, MD

## 2022-09-12 ENCOUNTER — Encounter: Payer: MEDICARE | Attending: Family | Primary: Internal Medicine

## 2022-10-03 NOTE — Telephone Encounter (Signed)
LVM confirming her appt with time and date

## 2022-10-09 ENCOUNTER — Encounter: Payer: MEDICARE | Attending: Internal Medicine | Primary: Internal Medicine

## 2022-10-09 ENCOUNTER — Telehealth

## 2022-10-09 MED ORDER — ALPRAZOLAM 0.5 MG PO TABS
0.5 MG | ORAL_TABLET | Freq: Two times a day (BID) | ORAL | 0 refills | Status: AC
Start: 2022-10-09 — End: 2022-10-23

## 2022-10-09 NOTE — Telephone Encounter (Signed)
Spoke with patient expressing that Duanne Moron is a controlled substance and any patient being prescribed the medication is required to be seen by a the prescribing Physician every 3 months via in office visit or virtual.    **Patient has a history of cancelling/rescheduling follow up appointment that was made within the 3 month timeframe to an appointment outside of the 3 month timeframe.**      Patient rescheduled 10/09/2022 appointment until 10/23/2022.      Please advise

## 2022-10-09 NOTE — Telephone Encounter (Signed)
Per Dr.Grnak patient will receive quantity to sustain her until the 16th of November appointment.

## 2022-10-09 NOTE — Telephone Encounter (Signed)
-----   Message from Janine Limbo sent at 10/09/2022 10:13 AM EDT -----  Subject: Refill Request    QUESTIONS  Name of Medication? ALPRAZolam (XANAX) 0.5 MG tablet  Patient-reported dosage and instructions? 0.5 mg twice daily  How many days do you have left? 0  Preferred Pharmacy? [facility] [phone]  Pharmacy phone number (if available)? [phone]  ---------------------------------------------------------------------------  --------------  Rod Can INFO  What is the best way for the office to contact you? OK to leave message on   voicemail  Preferred Call Back Phone Number? [phone]  ---------------------------------------------------------------------------  --------------  SCRIPT ANSWERS  Relationship to Patient? Self

## 2022-10-23 ENCOUNTER — Encounter: Admit: 2022-10-23 | Discharge: 2022-10-23 | Payer: MEDICARE | Attending: Internal Medicine | Primary: Internal Medicine

## 2022-10-23 DIAGNOSIS — Z23 Encounter for immunization: Secondary | ICD-10-CM

## 2022-10-23 MED ORDER — IPRATROPIUM-ALBUTEROL 0.5-2.5 (3) MG/3ML IN SOLN
Freq: Four times a day (QID) | RESPIRATORY_TRACT | 2 refills | Status: DC | PRN
Start: 2022-10-23 — End: 2022-11-07

## 2022-10-23 MED ORDER — FLUTICASONE-SALMETEROL 250-50 MCG/ACT IN AEPB
250-50 MCG/ACT | RESPIRATORY_TRACT | 2 refills | Status: DC
Start: 2022-10-23 — End: 2023-01-22

## 2022-10-23 MED ORDER — ONDANSETRON 8 MG PO TBDP
8 MG | ORAL_TABLET | ORAL | 3 refills | Status: DC
Start: 2022-10-23 — End: 2023-01-22

## 2022-10-23 MED ORDER — CITALOPRAM HYDROBROMIDE 40 MG PO TABS
40 MG | ORAL_TABLET | Freq: Every day | ORAL | 0 refills | Status: DC
Start: 2022-10-23 — End: 2023-01-22

## 2022-10-23 MED ORDER — TRELEGY ELLIPTA 100-62.5-25 MCG/ACT IN AEPB
100-62.5-25 MCG/ACT | Freq: Every day | RESPIRATORY_TRACT | 3 refills | Status: DC
Start: 2022-10-23 — End: 2023-01-22

## 2022-10-23 MED ORDER — NADOLOL 20 MG PO TABS
20 MG | ORAL_TABLET | Freq: Every day | ORAL | 1 refills | Status: DC
Start: 2022-10-23 — End: 2023-01-22

## 2022-10-23 MED ORDER — AMLODIPINE BESYLATE 10 MG PO TABS
10 MG | ORAL_TABLET | Freq: Every morning | ORAL | 0 refills | Status: DC
Start: 2022-10-23 — End: 2023-01-22

## 2022-10-23 MED ORDER — POTASSIUM CHLORIDE CRYS ER 10 MEQ PO TBCR
10 MEQ | ORAL_TABLET | Freq: Two times a day (BID) | ORAL | 1 refills | Status: DC
Start: 2022-10-23 — End: 2023-01-22

## 2022-10-23 MED ORDER — ALBUTEROL SULFATE HFA 108 (90 BASE) MCG/ACT IN AERS
10890 (90 Base) MCG/ACT | RESPIRATORY_TRACT | 3 refills | Status: DC
Start: 2022-10-23 — End: 2023-01-22

## 2022-10-23 MED ORDER — BUPROPION HCL ER (SR) 150 MG PO TB12
150 MG | ORAL_TABLET | Freq: Two times a day (BID) | ORAL | 0 refills | Status: DC
Start: 2022-10-23 — End: 2023-01-22

## 2022-10-23 MED ORDER — ALPRAZOLAM 0.5 MG PO TABS
0.5 MG | ORAL_TABLET | Freq: Two times a day (BID) | ORAL | 2 refills | Status: DC
Start: 2022-10-23 — End: 2022-11-25

## 2022-10-23 MED ORDER — OMEPRAZOLE 40 MG PO CPDR
40 MG | ORAL_CAPSULE | Freq: Every day | ORAL | 1 refills | Status: DC
Start: 2022-10-23 — End: 2023-01-22

## 2022-10-23 MED ORDER — FUROSEMIDE 20 MG PO TABS
20 MG | ORAL_TABLET | Freq: Every day | ORAL | 2 refills | Status: DC
Start: 2022-10-23 — End: 2023-01-22

## 2022-10-23 NOTE — Progress Notes (Signed)
[place] Karl Erway, D.O.   [facility]  [place], [place]  Tel: [phone]    Office Visit: Follow Up     Patient Name: Elizabeth Ingram   DOB:  [birth year]   MRN:   [id number]      Today's Date: 10/23/22 5:47 PM    Subjective     The patient is a 61 y.o.-year-old female who presents for follow-up.    Hypertension  -Amlodipine 10 mg daily  -Nadolol 20 mg daily for esophageal varices prophylaxis  -Lasix 20 mg daily PRN     Tobacco dependence due to cigarettes.  -Wellbutrin to 150 mg BID  -she states she is still smoking half a pack a day      Anxiety and depression, well controlled   -Xanax 2.5 mg twice daily  -Celexa 40 mg daily  -Wellbutrin 150 mg BID      Cirrhosis of the liver  -MELD-Na score of 9 (02/2021)  -Lasix 20 mg daily  -Spironolactone 50 mg daily, she has not been taking this.   -Nadalol (Corgard) daily 20 mg for esophageal varices  -Low-sodium diet (2 g)  -Lactulose daily   -Patient had follow-up with GI on 08/25/2022, she will be checked biannually with [facility] for [facility]  No other changes made at this visit.  -She has EGD scheduled for 01/28/2023.  -She will follow-up with GI in March 2024     GERD, well controlled   -Omeprazole 40 mg daily, she is taking this as needed     Obesity  -Weight is 150 pounds and BMI is 24.96 today  -she is eating healthy and exercising some.      Stage III CKD  -Patient following with nephrology  -she has not rescheduled her kidney doctor appointment yet.   -GFR from 07/09/2022 was greater than 60 and creatinine was 1.00     Bilateral leg edema  -Lasix as needed for swelling but patient is also taking this daily for her cirrhosis  -she states she really only gets swelling when she is on her feet all day.   -Patient states this is improved greatly     COPD  -Trelegy 100-60 2.5-25 mcg/ACT 1 puff daily; she states this is working very well for her.   -Patient has an appointment with pulmonology on 10/28/2022  -Albuterol as needed, she will  still need his 3-4 times a day   -Duonebs as needed     Chronic Low back Pain  -She recently saw pain management, they started her on oxycodone 5 mg; she has follow up next week; she would like to go back on Tramadol  -She has an appointment with pain management 10/24/2022 to discuss this.      Heart murmur  -Patient saw cardiology on 09/05/2022, she was scheduled for echocardiogram and stress test on 10/27/2022 with follow-up with Dr. Abigail Miyamoto on 11/05/2022     Health maintenance  -Due for colonoscopy in 2029  -Due for mammogram in 2024  -Low-dose CAT ordered, but not yet completed.   -Annual wellness visit due in June 2023  -Due for Pap smear in 2027    Today:   No new complaints.     Review of Systems   All other systems reviewed and are negative.     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/06/2021    Stage 2 chronic kidney disease     Thrombocytopenia ([place]) 04/17/2021     Social History     Socioeconomic History    Marital status: Social worker     Spouse name: Not on file    Number of children: Not on file    Years of education: Not on file    Highest education level: Not on file   Occupational History    Not on file   Tobacco Use    Smoking status: Every Day     Packs/day: 0.50     Years: 40.00     Additional pack years: 0.00     Total pack years: 20.00     Types: Cigarettes    Smokeless tobacco: Never    Tobacco comments:     Patient down to 5-6 cigarettes a day   Substance and Sexual Activity    Alcohol use: No    Drug use: No    Sexual activity: Yes     Partners: Male   Other Topics Concern    Not on file   Social History Narrative    Not on file     Social Determinants of Health     Financial  Resource Strain: Low Risk  (10/23/2022)    Overall Financial Resource Strain (CARDIA)     Difficulty of Paying Living Expenses: Not hard at all   Food Insecurity: No Food Insecurity (10/23/2022)    Hunger Vital Sign     Worried About Running Out of Food in the Last Year: Never true     Ran Out of Food in the Last Year: Never true   Transportation Needs: Unknown (10/23/2022)    PRAPARE - Armed forces logistics/support/administrative officer (Medical): Not on file     Lack of Transportation (Non-Medical): No   Physical Activity: Sufficiently Active (10/23/2022)    Exercise Vital Sign     Days of Exercise per Week: 7 days     Minutes of Exercise per Session: 60 min   Stress: Not on file   Social Connections: Not on file   Intimate Partner Violence: Not on file   Housing Stability: Unknown (10/23/2022)    Housing Stability Vital Sign     Unable to Pay for Housing in the Last Year: Not on file     Number of [place] in the Last Year: Not on file     Unstable Housing in the Last Year: No         Current Outpatient Medications   Medication Sig    ALPRAZolam (XANAX) 0.5 MG tablet Take 2 tablets by mouth in the morning and at bedtime for 90 days. Max Daily Amount: 2 mg    amLODIPine (NORVASC) 10 MG tablet Take 1 tablet by mouth every morning    citalopram (CELEXA) 40 MG tablet Take 1 tablet by mouth daily Citalopram 40 mg tablet TAKE 1 TABLET BY MOUTH EVERY MORNING    omeprazole (PRILOSEC) 40 MG delayed release capsule Take 1 capsule by mouth daily    ondansetron (ZOFRAN-ODT) 8 MG TBDP disintegrating tablet DISSOLVE 1 TABLET(8 MG) ON THE TONGUE THREE TIMES DAILY FOR UP TO 30 DOSES AS NEEDED FOR NAUSEA  OR VOMITING    buPROPion (WELLBUTRIN SR) 150 MG extended release tablet Take 1 tablet by mouth 2 times daily    furosemide (LASIX) 20 MG tablet Take 1 tablet by mouth daily    potassium chloride (KLOR-CON M) 10 MEQ extended release tablet Take 1 tablet by mouth 2 times daily    albuterol sulfate HFA (PROVENTIL;VENTOLIN;PROAIR) 108 (90  Base) MCG/ACT inhaler INHALE 1 PUFF BY MOUTH EVERY 4 HOURS AS NEEDED FOR WHEEZING/SOB.    fluticasone-umeclidin-vilant (TRELEGY ELLIPTA) 100-62.5-25 MCG/ACT AEPB inhaler Inhale 1 puff into the lungs daily    ipratropium 0.5 mg-albuterol 2.5 mg (DUONEB) 0.5-2.5 (3) MG/3ML SOLN nebulizer solution Inhale 3 mLs into the lungs every 6 hours as needed for Shortness of Breath INHALE CONTENTS OF 1 VIAL VIA NEBULIZER DAILY AS NEEDED FOR WHEEZING    fluticasone-salmeterol (ADVAIR) 250-50 MCG/ACT AEPB diskus inhaler Inhale 1 puff by mouth twice daily    nadolol (CORGARD) 20 MG tablet Take 1 tablet by mouth daily    nystatin (MYCOSTATIN) 100000 UNIT/ML suspension     oxyCODONE (ROXICODONE) 5 MG immediate release tablet Take 1 tablet by mouth 2 times daily as needed.    naloxone 4 MG/0.1ML LIQD nasal spray CALL 911. SPR CONTENTS OF ONE SPRAYER (0.1ML) INTO ONE NOSTRIL. REPEAT IN 2-3 MIN IF SYMPTOMS OF OPIOID EMERGENCY PERSIST, ALTERNATE NOSTRILS    Respiratory Therapy Supplies (NEBULIZER/TUBING/MOUTHPIECE) KIT 1 kit by Does not apply route daily as needed (as needed for wheezing)    lactulose (CHRONULAC) 10 GM/15ML solution TAKE 30 ML BY MOUTH TWICE DAILY     No current facility-administered medications for this visit.        Objective     Vitals:    10/23/22 1319   BP: 116/63   Site: Left Upper Arm   Position: Sitting   Cuff Size: Small Adult   Pulse: 64   Resp: 12   Temp: 98.5 F (36.9 C)   TempSrc: Temporal   SpO2: 91%   Weight: 68 kg (150 lb)   Height: 1.651 m (_0 )        Physical Exam:  Constitutional: Appears well kempt. Alert/oriented x3. In no acute distress.  Head: Normocephalic No trauma. No deformity.   Cardiac: Heart with normal rate/rhythm. No murmurs. No gallops. Pulses normal.   Pulmonary: Lungs clear to auscultation bilaterally. In no respiratory distress. No wheezing. No rales. No rhonci.   Psychiatric: Normal thought content. Normal behavior. Normal judgment.     Recommendations     Assessment:  Patient  Active Problem List   Diagnosis    Hypertension    Anxiety and depression    Obesity (BMI 30.0-34.9)    Hepatic cirrhosis (HCC)    Stage 2 chronic kidney disease    Shortness of breath    Gastroesophageal reflux disease without esophagitis    Decreased visual acuity    Tobacco dependence    Chronic obstructive pulmonary disease, unspecified COPD type (HCC)    Atypical chest pain    Abnormal ECG      Status of above conditions: Stable    Plan:  Hypertension  -Amlodipine 10 mg daily  -Nadolol 20 mg daily for esophageal varices prophylaxis  -Lasix 20 mg daily     Tobacco dependence due to cigarettes.  -Wellbutrin to 150 mg BID  -she states she is still smoking half a pack a day  -Encouraged patient for continued smoking cessation efforts     Anxiety and depression, well controlled   -Xanax  2.5 mg twice daily  -Celexa 40 mg daily  -Wellbutrin 150 mg BID      Cirrhosis of the liver  -MELD-Na score of 9 (02/2021)  -Lasix 20 mg daily  -Spironolactone 50 mg daily, she has not been taking this.   -Nadalol (Corgard) daily 20 mg for esophageal varices  -Low-sodium diet (2 g)  -Lactulose daily   -Patient had follow-up with GI on 08/25/2022, she will be checked biannually with [facility] for [facility]  No other changes made at this visit.  -She has EGD scheduled for 01/28/2023.  -She will follow-up with GI in March 2024     GERD, well controlled   -Omeprazole 40 mg daily, she is taking this as needed     Obesity  -Weight is 150 pounds and BMI is 24.96 today  -Continue diet and exercise     Stage III CKD  -Patient to follow-up with nephrology, her kidney function is stable on last draw.  -GFR from 07/09/2022 was greater than 60 and creatinine was 1.00     Bilateral leg edema  -Lasix 20 mg daily     COPD  -Trelegy 100-60 2.5-25 mcg/ACT 1 puff daily; she states this is working very well for her.   -Patient has an appointment with pulmonology on 10/28/2022  -Albuterol as needed, she will still need his 3-4 times a day   -Duonebs as needed      Chronic Low back Pain  -She recently saw pain management, they started her on oxycodone 5 mg; she has follow up next week; she would like to go back on Tramadol  -She has an appointment with pain management 10/24/2022 to discuss this.      Heart murmur  -Patient saw cardiology on 09/05/2022, she was scheduled for echocardiogram and stress test on 10/27/2022 with follow-up with Dr. Abigail Miyamoto on 11/05/2022     Health maintenance  -Due for colonoscopy in 2029  -Due for mammogram in 2024  -Low-dose CAT ordered  -Annual wellness visit due in June 2023  -Due for Pap smear in 2027    -Previous medical records and/or labs/tests available to me reviewed, any records outstanding not available requested  -The risks, benefits, and medical necessity of all medications, tests labs, and any other orders that were ordered at today's visit were discussed with the patient including all elective or patient requested orders.  Decision to order or not order tests are based on this risk/benefit ratio and medical necessity.  -The patient expresses understanding of the plan as I've explained it to her and is in agreement with the current plan.    Follow Up: 3 months    Total Time: 30 minutes (chart reviewing and in-exam time)    Signed: [place] Jahquez Steffler, D.O.  10/23/22  5:47 PMMedicare Annual Wellness Visit    JUANETTE URIZAR is here for Follow-up and Medicare AWV (SUB)    Assessment & Plan   Encounter for immunization  -     Influenza, FLUCELVAX, (age 33 mo+), IM, PF, 0.5 mL  Anxiety and depression  -     ALPRAZolam (XANAX) 0.5 MG tablet; Take 2 tablets by mouth in the morning and at bedtime for 90 days. Max Daily Amount: 2 mg, Disp-60 tablet, R-2Normal  -     citalopram (CELEXA) 40 MG tablet; Take 1 tablet by mouth daily Citalopram 40 mg tablet TAKE 1 TABLET BY MOUTH EVERY MORNING, Disp-90 tablet, R-0Normal  -     buPROPion (WELLBUTRIN SR) 150 MG extended release  tablet; Take 1 tablet by mouth 2 times daily, Disp-180 tablet, R-0Normal  Primary  hypertension  -     amLODIPine (NORVASC) 10 MG tablet; Take 1 tablet by mouth every morning, Disp-90 tablet, R-0Normal  Gastroesophageal reflux disease without esophagitis  -     omeprazole (PRILOSEC) 40 MG delayed release capsule; Take 1 capsule by mouth daily, Disp-90 capsule, R-1Normal  Nausea  -     ondansetron (ZOFRAN-ODT) 8 MG TBDP disintegrating tablet; DISSOLVE 1 TABLET(8 MG) ON THE TONGUE THREE TIMES DAILY FOR UP TO 30 DOSES AS NEEDED FOR NAUSEA OR VOMITING, Disp-30 tablet, R-3Normal  Tobacco dependence  -     buPROPion (WELLBUTRIN SR) 150 MG extended release tablet; Take 1 tablet by mouth 2 times daily, Disp-180 tablet, R-0Normal  Stage 2 chronic kidney disease  -     furosemide (LASIX) 20 MG tablet; Take 1 tablet by mouth daily, Disp-30 tablet, R-2Normal  Cirrhosis of liver without ascites, unspecified hepatic cirrhosis type (HCC)  -     furosemide (LASIX) 20 MG tablet; Take 1 tablet by mouth daily, Disp-30 tablet, R-2Normal  -     nadolol (CORGARD) 20 MG tablet; Take 1 tablet by mouth daily, Disp-90 tablet, R-1Normal  -     Hepatic Function Panel; Future  Hypokalemia  -     potassium chloride (KLOR-CON M) 10 MEQ extended release tablet; Take 1 tablet by mouth 2 times daily, Disp-90 tablet, R-1Normal  Shortness of breath  -     albuterol sulfate HFA (PROVENTIL;VENTOLIN;PROAIR) 108 (90 Base) MCG/ACT inhaler; INHALE 1 PUFF BY MOUTH EVERY 4 HOURS AS NEEDED FOR WHEEZING/SOB., Disp-8.5 g, R-3Normal  -     ipratropium 0.5 mg-albuterol 2.5 mg (DUONEB) 0.5-2.5 (3) MG/3ML SOLN nebulizer solution; Inhale 3 mLs into the lungs every 6 hours as needed for Shortness of Breath INHALE CONTENTS OF 1 VIAL VIA NEBULIZER DAILY AS NEEDED FOR WHEEZING, Disp-360 mL, R-2Normal  Chronic obstructive pulmonary disease, unspecified COPD type ([place])  -     fluticasone-umeclidin-vilant (TRELEGY ELLIPTA) 100-62.5-25 MCG/ACT AEPB inhaler; Inhale 1 puff into the lungs daily, Disp-1 each, R-3Normal  -     fluticasone-salmeterol (ADVAIR)  250-50 MCG/ACT AEPB diskus inhaler; Inhale 1 puff by mouth twice daily, Disp-60 each, R-2Normal  Personal history of tobacco use  -     PR VISIT TO DISCUSS LUNG CA SCREEN W LDCT  -     CT Lung Screen (Initial/Annual/Baseline); Future  Medicare annual wellness visit, subsequent    Recommendations for Preventive Services Due: see orders and patient instructions/AVS.  Recommended screening schedule for the next 5-10 years is provided to the patient in written form: see Patient Instructions/AVS.     No follow-ups on file.     Subjective       Patient's complete Health Risk Assessment and screening values have been reviewed and are found in Flowsheets. The following problems were reviewed today and where indicated follow up appointments were made and/or referrals ordered.    Positive Risk Factor Screenings with Interventions:                Opioid Risk: (High risk score ?55) Opioid risk score: 66    Last PDMP Mark as Reviewed:  Review User Review Instant Review Result   Taralynn Quiett, [place] J 07/09/2022 12:55 PM @   Reviewed PDMP [1]          General HRA Questions:  Select all that apply: (!) New or Increased Fatigue    Fatigue Interventions:  Patient  advised to follow up in the office for further evaluation and treatment  See AVS for additional education material        Dentist Screen:  Have you seen the dentist within the past year?: (!) No    Intervention:  Advised to schedule with their dentist     Vision Screen:  Do you have difficulty driving, watching TV, or doing any of your daily activities because of your eyesight?: (!) Yes  Have you had an eye exam within the past year?: Appointment is scheduled  No results found.    Interventions:   Patient encouraged to make appointment with their eye specialist      Advanced Directives:  Do you have a Living Will?: (!) No    Intervention:          Tobacco Use:  Tobacco Use: High Risk (10/23/2022)    Patient History     Smoking Tobacco Use: Every Day     Smokeless Tobacco Use: Never      Passive Exposure: Not on file     E-cigarette/Vaping       Questions Responses    E-cigarette/Vaping Use     Start Date     Passive Exposure     Quit Date     Counseling Given     Comments           Interventions:  Patient advised to follow up in the office for further evalution and treatment  See AVS for additional education material                        Objective   Vitals:    10/23/22 1319   BP: 116/63   Site: Left Upper Arm   Position: Sitting   Cuff Size: Small Adult   Pulse: 64   Resp: 12   Temp: 98.5 F (36.9 C)   TempSrc: Temporal   SpO2: 91%   Weight: 68 kg (150 lb)   Height: 1.651 m (_0 )      Body mass index is 24.96 kg/m.               Allergies   Allergen Reactions    Gabapentin Other (See Comments)     Pt reports decreased respirations while taking this med     Prior to Visit Medications    Medication Sig Taking? Authorizing Provider   ALPRAZolam Duanne Moron) 0.5 MG tablet Take 2 tablets by mouth in the morning and at bedtime for 90 days. Max Daily Amount: 2 mg Yes Adara Kittle, [place] J, DO   amLODIPine (NORVASC) 10 MG tablet Take 1 tablet by mouth every morning Yes Jaymen Fetch, [place] J, DO   citalopram (CELEXA) 40 MG tablet Take 1 tablet by mouth daily Citalopram 40 mg tablet TAKE 1 TABLET BY MOUTH EVERY MORNING Yes Kierria Feigenbaum, [place] J, DO   omeprazole (PRILOSEC) 40 MG delayed release capsule Take 1 capsule by mouth daily Yes Carline Dura, [place] J, DO   ondansetron (ZOFRAN-ODT) 8 MG TBDP disintegrating tablet DISSOLVE 1 TABLET(8 MG) ON THE TONGUE THREE TIMES DAILY FOR UP TO 30 DOSES AS NEEDED FOR NAUSEA OR VOMITING Yes Forest Pruden, [place] J, DO   buPROPion (WELLBUTRIN SR) 150 MG extended release tablet Take 1 tablet by mouth 2 times daily Yes Jameeka Marcy, [place] J, DO   furosemide (LASIX) 20 MG tablet Take 1 tablet by mouth daily Yes Aws Shere, [place] J, DO   potassium chloride (KLOR-CON M) 10 MEQ extended release tablet Take  1 tablet by mouth 2 times daily Yes Deontez Klinke, [place] J, DO   albuterol sulfate HFA (PROVENTIL;VENTOLIN;PROAIR)  108 (90 Base) MCG/ACT inhaler INHALE 1 PUFF BY MOUTH EVERY 4 HOURS AS NEEDED FOR WHEEZING/SOB. Yes Louie Meaders, [place] J, DO   fluticasone-umeclidin-vilant (TRELEGY ELLIPTA) 100-62.5-25 MCG/ACT AEPB inhaler Inhale 1 puff into the lungs daily Yes Trust Crago, [place] J, DO   ipratropium 0.5 mg-albuterol 2.5 mg (DUONEB) 0.5-2.5 (3) MG/3ML SOLN nebulizer solution Inhale 3 mLs into the lungs every 6 hours as needed for Shortness of Breath INHALE CONTENTS OF 1 VIAL VIA NEBULIZER DAILY AS NEEDED FOR WHEEZING Yes Cyniah Gossard, [place] J, DO   fluticasone-salmeterol (ADVAIR) 250-50 MCG/ACT AEPB diskus inhaler Inhale 1 puff by mouth twice daily Yes Desira Alessandrini, [place] J, DO   nadolol (CORGARD) 20 MG tablet Take 1 tablet by mouth daily Yes Jackquelyn Sundberg, [place] J, DO   nystatin (MYCOSTATIN) 100000 UNIT/ML suspension  Yes [provider]   oxyCODONE (ROXICODONE) 5 MG immediate release tablet Take 1 tablet by mouth 2 times daily as needed. Yes [provider]   naloxone 4 MG/0.1ML LIQD nasal spray CALL 911. SPR CONTENTS OF ONE SPRAYER (0.1ML) INTO ONE NOSTRIL. REPEAT IN 2-3 MIN IF SYMPTOMS OF OPIOID EMERGENCY PERSIST, ALTERNATE NOSTRILS Yes [provider]   Respiratory Therapy Supplies (NEBULIZER/TUBING/MOUTHPIECE) KIT 1 kit by Does not apply route daily as needed (as needed for wheezing) Yes Jaid Quirion, [place] J, DO   lactulose (CHRONULAC) 10 GM/15ML solution TAKE 30 ML BY MOUTH TWICE DAILY Yes [provider]       CareTeam (Including outside providers/suppliers regularly involved in providing care):   Patient Care Team:  Vaden Becherer, [place] J, DO as PCP - [place], [place] J, DO as PCP - Empaneled Provider     Reviewed and updated this visit:  Tobacco  Allergies  Meds  Med Hx  Surg Hx  Soc Hx  Fam Hx

## 2022-10-23 NOTE — Patient Instructions (Signed)
Fatigue: Care Instructions  Your Care Instructions     Fatigue is a feeling of tiredness, exhaustion, or lack of energy. You may feel fatigue because of too much or not enough activity. It can also come from stress, lack of sleep, boredom, and poor diet. Many medical problems, such as viral infections, can cause fatigue. Emotional problems, especially depression, are often the cause of fatigue.  Fatigue is most often a symptom of another problem. Treatment for fatigue depends on the cause. For example, if you have fatigue because you have a certain health problem, treating this problem also treats your fatigue. If depression or anxiety is the cause, treatment may help.  Follow-up care is a key part of your treatment and safety. Be sure to make and go to all appointments, and call your doctor if you are having problems. It's also a good idea to know your test results and keep a list of the medicines you take.  How can you care for yourself at home?  Get regular exercise. But don't overdo it. Go back and forth between rest and exercise.  Get plenty of rest.  Eat a healthy diet. Do not skip meals, especially breakfast.  Reduce your use of caffeine, tobacco, and alcohol. Caffeine is most often found in coffee, tea, cola drinks, and chocolate.  Limit medicines that can cause fatigue. This includes tranquilizers and cold and allergy medicines.  When should you call for help?  Watch closely for changes in your health, and be sure to contact your doctor if:   You have new symptoms such as fever or a rash.    Your fatigue gets worse.    You have been feeling down, depressed, or hopeless. Or you may have lost interest in things that you usually enjoy.    You are not getting better as expected.   Where can you learn more?  Go to https://www.bennett.info/ and enter W864 to learn more about "Fatigue: Care Instructions."  Current as of: June 25, 2023Content Version: 13.8   2006-2023 Healthwise,  Incorporated.   Care instructions adapted under license by [facility]. If you have questions about a medical condition or this instruction, always ask your healthcare professional. [place] any warranty or liability for your use of this information.           Learning About Vision Tests  What are vision tests?     The four most common vision tests are visual acuity tests, refraction, visual field tests, and color vision tests.  Visual acuity (sharpness) tests  These tests are used:  To see if you need glasses or contact lenses.  To monitor an eye problem.  To check an eye injury.  Visual acuity tests are done as part of routine exams. You may also have this test when you get your driver's license or apply for some types of jobs.  Visual field tests  These tests are used:  To check for vision loss in any area of your range of vision.  To screen for certain eye diseases.  To look for nerve damage after a stroke, head injury, or other problem that could reduce blood flow to the brain.  Refraction and color tests  A refraction test is done to find the right prescription for glasses and contact lenses.  A color vision test is done to check for color blindness.  Color vision is often tested as part of a routine exam. You may also have this test when you apply for  a job where recognizing different colors is important, such as truck driving, Research officer, trade union, or the [employer].  How are vision tests done?  Visual acuity test   You cover one eye at a time.  You read aloud from a wall chart across the room.  You read aloud from a small card that you hold in your hand.  Refraction   You look into a special device.  The device puts lenses of different strengths in front of each eye to see how strong your glasses or contact lenses need to be.  Visual field tests   Your doctor may have you look through special machines.  Or your doctor may simply have you stare straight ahead while they move a finger into and  out of your field of vision.  Color vision test   You look at pieces of printed test patterns in various colors. You say what number or symbol you see.  Your doctor may have you trace the number or symbol using a pointer.  How do these tests feel?  There is very little chance of having a problem from this test. If dilating drops are used for a vision test, they may make the eyes sting and cause a medicine taste in the mouth.  Follow-up care is a key part of your treatment and safety. Be sure to make and go to all appointments, and call your doctor if you are having problems. It's also a good idea to know your test results and keep a list of the medicines you take.  Where can you learn more?  Go to https://www.bennett.info/ and enter G551 to learn more about "Learning About Vision Tests."  Current as of: June 6, 2023Content Version: 13.8   2006-2023 Healthwise, Incorporated.   Care instructions adapted under license by [facility]. If you have questions about a medical condition or this instruction, always ask your healthcare professional. [place] any warranty or liability for your use of this information.           Advance Directives: Care Instructions  Overview  An advance directive is a legal way to state your wishes at the end of your life. It tells your family and your doctor what to do if you can't say what you want.  There are two main types of advance directives. You can change them any time your wishes change.  Living will.  This form tells your family and your doctor your wishes about life support and other treatment. The form is also called a declaration.  Medical power of attorney.  This form lets you name a person to make treatment decisions for you when you can't speak for yourself. This person is called a health care agent (health care proxy, health care surrogate). The form is also called a durable power of attorney for health care.  If you do not  have an advance directive, decisions about your medical care may be made by a family member, or by a doctor or a judge who doesn't know you.  It may help to think of an advance directive as a gift to the people who care for you. If you have one, they won't have to make tough decisions by themselves.  For more information, including forms for your state, see the [place] website (RebankingSpace.hu).  Follow-up care is a key part of your treatment and safety. Be sure to make and go to all appointments, and call your doctor if you are having problems. It's also a  good idea to know your test results and keep a list of the medicines you take.  What should you include in an advance directive?  Many states have a unique advance directive form. (It may ask you to address specific issues.) Or you might use a universal form that's approved by many states.  If your form doesn't tell you what to address, it may be hard to know what to include in your advance directive. Use the questions below to help you get started.  Who do you want to make decisions about your medical care if you are not able to?  What life-support measures do you want if you have a serious illness that gets worse over time or can't be cured?  What are you most afraid of that might happen? (Maybe you're afraid of having pain, losing your independence, or being kept alive by machines.)  Where would you prefer to die? (Your home? A hospital? A nursing home?)  Do you want to donate your organs when you die?  Do you want certain religious practices performed before you die?  When should you call for help?  Be sure to contact your doctor if you have any questions.  Where can you learn more?  Go to RecruitSuit.cahttps://www.healthwise.net/patientEd and enter R264 to learn more about "Advance Directives: Care Instructions."  Current as of: March 26, 2023Content Version: 13.8   2006-2023 Healthwise, Incorporated.   Care instructions  adapted under license by [facility]. If you have questions about a medical condition or this instruction, always ask your healthcare professional. Healthwise, Incorporated disclaims any warranty or liability for your use of this information.           Abnormal Weight Loss: Care Instructions  Your Care Instructions     There are two types of weight loss--normal and abnormal. The normal kind happens when you are trying to lose weight by exercising more or eating less. The abnormal kind happens when you are not trying to lose weight.  Many medical problems can cause abnormal weight loss. These include problems with your thyroid gland, long-term infections, mouth or throat problems that make it hard to eat, and digestive problems. They also include depression and cancer. Some medicines also may cause you to lose weight.  You can work with your doctor to find the cause of your weight loss. You will probably need tests to do this.  Follow-up care is a key part of your treatment and safety. Be sure to make and go to all appointments, and call your doctor if you are having problems. It's also a good idea to know your test results and keep a list of the medicines you take.  How can you care for yourself at home?  Weigh yourself at the same time every day. It's best to do it first thing in the morning after you empty your bladder. Be sure to always wear the same amount of clothing.  Write down any changes in your weight and the possible causes. Discuss these with your doctor.  Your doctor may want you to change your diet. Do your best to follow his or her advice.  Ask your doctor if you should see a dietitian. This is a person who can help you plan meals that work best for your lifestyle.  Note any changes in bowel habits. These may include changes in how often you have a bowel movement. Other changes include the color and size of your stools and how solid they are.  If you are prescribed medicines, take them exactly as  prescribed. Call your doctor if you think you are having a problem with your medicine. You will get more details on the specific medicines your doctor prescribes.  When should you call for help?  Watch closely for changes in your health, and be sure to contact your doctor if:   You do not get better as expected.    You continue to lose weight.   Where can you learn more?  Go to RecruitSuit.ca and enter A790 to learn more about "Abnormal Weight Loss: Care Instructions."  Current as of: February 28, 2023Content Version: 13.8   2006-2023 Healthwise, Incorporated.   Care instructions adapted under license by [facility]. If you have questions about a medical condition or this instruction, always ask your healthcare professional. Healthwise, Incorporated disclaims any warranty or liability for your use of this information.           Body Mass Index: Care Instructions  Overview     Body mass index (BMI) can help you see if your weight is raising your risk for health problems. It uses a formula to compare how much you weigh with how tall you are.  A BMI lower than 18.5 is considered underweight.  A BMI between 18.5 and 24.9 is considered healthy.  A BMI between 25 and 29.9 is considered overweight. A BMI of 30 or higher is considered obese.  If your BMI is in the normal range, it means that you have a lower risk for weight-related health problems. If your BMI is in the overweight or obese range, you may be at increased risk for weight-related health problems, such as high blood pressure, heart disease, stroke, arthritis or joint pain, and diabetes. If your BMI is in the underweight range, you may be at increased risk for health problems such as fatigue, lower protection (immunity) against illness, muscle loss, bone loss, hair loss, and hormone problems.  BMI is just one measure of your risk for weight-related health problems. You may be at higher risk for health problems if you are  not active, you eat an unhealthy diet, or you drink too much alcohol or use tobacco products.  Follow-up care is a key part of your treatment and safety. Be sure to make and go to all appointments, and call your doctor if you are having problems. It's also a good idea to know your test results and keep a list of the medicines you take.  How can you care for yourself at home?  Practice healthy eating habits. This includes eating plenty of fruits, vegetables, whole grains, lean protein, and low-fat dairy.  If your doctor recommends it, get more exercise. Walking is a good choice. Bit by bit, increase the amount you walk every day. Try for at least 30 minutes on most days of the week.  Do not smoke. Smoking can increase your risk for health problems. If you need help quitting, talk to your doctor about stop-smoking programs and medicines. These can increase your chances of quitting for good.  Limit alcohol to 2 drinks a day for men and 1 drink a day for women. Too much alcohol can cause health problems.  If you have a BMI higher than 25  Your doctor may do other tests to check your risk for weight-related health problems. This may include measuring the distance around your waist. A waist measurement of more than 40 inches in men or 35 inches in women can increase the  risk of weight-related health problems.  Talk with your doctor about steps you can take to stay healthy or improve your health. You may need to make lifestyle changes to lose weight and stay healthy, such as changing your diet and getting regular exercise.  If you have a BMI lower than 18.5  Your doctor may do other tests to check your risk for health problems.  Talk with your doctor about steps you can take to stay healthy or improve your health. You may need to make lifestyle changes to gain or maintain weight and stay healthy, such as getting more healthy foods in your diet and doing exercises to build muscle.  Where can you learn more?  Go to  https://www.bennett.info/ and enter S176 to learn more about "Body Mass Index: Care Instructions."  Current as of: May 14, 2023Content Version: 13.8   2006-2023 Healthwise, Incorporated.   Care instructions adapted under license by [facility]. If you have questions about a medical condition or this instruction, always ask your healthcare professional. [place] any warranty or liability for your use of this information.           Eating Healthy Foods: Care Instructions  With every meal, you can make healthy food choices. Try to eat a variety of fruits, vegetables, whole grains, lean proteins, and low-fat dairy products. This can help you get the right balance of nutrients, including vitamins and minerals. Small changes add up over time. You can start by adding one healthy food to your meals each day.    Try to make half your plate fruits and vegetables, one-fourth whole grains, and one-fourth lean proteins. Try including dairy with your meals.   Eat more fruits and vegetables. Try to have them with most meals and snacks.   Foods for healthy eating    Fruits    These can be fresh, frozen, canned, or dried.  Try to choose whole fruit rather than fruit juice.  Eat a variety of colors.    Vegetables    These can be fresh, frozen, canned, or dried.  Beans, peas, and lentils count too.    Whole grains    Choose whole-grain breads, cereals, and noodles.  Try brown rice.    Lean proteins    These can include lean meat, poultry, fish, and eggs.  You can also have tofu, beans, peas, lentils, nuts, and seeds.    Dairy    Try milk, yogurt, and cheese.  Choose low-fat or fat-free when you can.  If you need to, use lactose-free milk or fortified plant-based milk products, such as soy milk.    Water    Drink water when you're thirsty.  Limit sugar-sweetened drinks, including soda, fruit drinks, and sports drinks.  Where can you learn more?  Go to  https://www.bennett.info/ and enter T756 to learn more about "Eating Healthy Foods: Care Instructions."  Current as of: February 28, 2023Content Version: 13.8   2006-2023 Healthwise, Incorporated.   Care instructions adapted under license by [facility]. If you have questions about a medical condition or this instruction, always ask your healthcare professional. [place] any warranty or liability for your use of this information.           7 Tips for Eating Healthy When [facility] All the Time    Make quick meals on busy nights.  Try recipes that are simple to make and easy to clean up, like pasta, soups, or casseroles. These dishes can often be  made ahead of time and are easy to reheat when you're short on time.     Buy foods that last.  Choose fresh foods, such as onions, garlic, and potatoes. You can also reach for frozen, canned, and dried foods. Foods like these last and can help you pull a healthy meal together quickly.     Cook something new.  Try checking out cookbooks from [employer]. Or search for new recipes online. You can also change the ingredients in an old favorite recipe. Or switch the seasonings to create something new.     Try theme nights.  Try "Taco Tuesday." Do "Meatless Monday" for a vegetarian meal each week. And save "Leftovers Night" for days when you don't want to cook. Let your favorite dishes guide you. Then make them again every few weeks.     Adriana Simas with a friend.  Maybe you know someone who's feeling the same way about healthy eating. Pick a recipe and schedule a night to make it "together." You can also video call while you cook or eat.     Share food with other people.  If you like cooking and baking, you can share what you've made. Split up what you've made and keep only what you want to eat. You can wrap up the rest to share with a friend, neighbor, or family member.     Aim for balance, variety, and moderation.  On most days,  eat from each food group--grains, protein foods, vegetables, fruits, and dairy. And choose different foods from each group. For example, if you often eat apples, try reaching for a banana instead. All foods, if you eat them in moderation, can be part of healthy eating.   Current as of: February 28, 2023Content Version: 13.8   2006-2023 Healthwise, Incorporated.   Care instructions adapted under license by [facility]. If you have questions about a medical condition or this instruction, always ask your healthcare professional. Healthwise, Incorporated disclaims any warranty or liability for your use of this information.         Learning About Dietary Guidelines  What are the Dietary Guidelines for Americans?     Dietary Guidelines for Americans provide tips for eating well and staying healthy. This helps reduce the risk for long-term (chronic) diseases.  These guidelines recommend that you:  Eat and drink the right amount for you. The U.[place] government's food guide is called MyPlate. It can help you make your own well-balanced eating plan.  Try to balance your eating with your activity. This helps you stay at a healthy weight.  Drink alcohol in moderation, if at all.  Limit foods high in salt, saturated fat, trans fat, and added sugar.  These guidelines are from the U.S. Department of Agriculture and the [employer]. Department of Health and [employer]. They are updated every 5 years.  What is MyPlate?  MyPlate is the U.[place] government's food guide. It can help you make your own well-balanced eating plan. A balanced eating plan means that you eat enough, but not too much, and that your food gives you the nutrients you need to stay healthy.  MyPlate focuses on eating plenty of whole grains, fruits, and vegetables, and on limiting fat and sugar. It is available online at www.https://www.bernard.org/.  How can you get started?  If you're trying to eat healthier, you can slowly change your eating habits over time. You  don't have to make big changes all at once. Start by adding one or two healthy  foods to your meals each day.  Grains  Choose whole-grain breads and cereals and whole-wheat pasta and whole-grain crackers.  Vegetables  Eat a variety of vegetables every day. They have lots of nutrients and are part of a heart-healthy diet.  Fruits  Eat a variety of fruits every day. Fruits contain lots of nutrients. Choose fresh fruit instead of fruit juice.  Protein foods  Choose fish and lean poultry more often. Eat red meat and fried meats less often. Dried beans, tofu, and nuts are also good sources of protein.  Dairy  Choose low-fat or fat-free products from this food group. If you have problems digesting milk, try eating cheese or yogurt instead.  Fats and oils  Limit fats and oils if you're trying to cut calories. Choose healthy fats when you cook. These include canola oil and olive oil.  Where can you learn more?  Go to RecruitSuit.ca and enter D676 to learn more about "Learning About Dietary Guidelines."  Current as of: February 28, 2023Content Version: 13.8   2006-2023 Healthwise, Incorporated.   Care instructions adapted under license by [facility]. If you have questions about a medical condition or this instruction, always ask your healthcare professional. Healthwise, Incorporated disclaims any warranty or liability for your use of this information.           Learning About Lung Cancer Screening  What is screening for lung cancer?     Lung cancer screening is a way to find some lung cancers early, before a person has any symptoms of the cancer.  Lung cancer screening may help those who have the highest risk for lung cancer--people age 30 and older who are or were heavy smokers. For most people, who aren't at increased risk, screening for lung cancer probably isn't helpful.  Screening won't prevent cancer. And it may not find all lung cancers. Lung cancer screening may lower the risk  of dying from lung cancer in a small number of people.  How is it done?  Lung cancer screening is done with a low-dose CT (computed tomography) scan. A CT scan uses X-rays, or radiation, to make detailed pictures of your body. Experts recommend that screening be done in medical centers that focus on finding and treating lung cancer.  Who is screening recommended for?  Lung cancer screening is recommended for people age 67 and older who are or were heavy smokers. That means people with a smoking history of at least 20 pack years. A pack year is a way to measure how heavy a smoker you are or were.  To figure out your pack years, multiply how many packs a day on average (assuming 20 cigarettes per pack) you have smoked by how many years you have smoked. For example:  If you smoked 1 pack a day for 20 years, that's 1 times 20. So you have a smoking history of 20 pack years.  If you smoked 2 packs a day for 10 years, that's 2 times 10. So you have a smoking history of 20 pack years.  Experts agree that screening is for people who have a high risk of lung cancer. But experts don't agree on what high risk means. Some say people age 56 or older with at least a 20-pack-year smoking history are high risk. Others say it's people age 65 or older with a 30-pack-year history.  To see if you could benefit from screening, first find out if you are at high risk for lung cancer. Your doctor  can help you decide your lung cancer risk.  What are the risks of screening?  CT screening for lung cancer isn't perfect. It can show an abnormal result when it turns out there wasn't any cancer. This is called a false-positive result. This means you may need more tests to make sure you don't have cancer. These tests can be harmful and cause a lot of worry.  These tests may include more CT scans and invasive testing like a lung biopsy. In a biopsy, the doctor takes a sample of tissue from inside your lung so it can be looked at under a microscope.  A biopsy is the only way to tell if you have lung cancer. If the biopsy finds cancer, you and your doctor will have to decide how or whether to treat it.  Some lung cancers found on CT scans are harmless and would not have caused a problem if they had not been found through screening. But because doctors can't tell which ones will turn out to be harmless, most will be treated. This means that you may get treatment--including surgery, radiation, or chemotherapy--that you don't need.  There is a risk of damage to cells or tissue from being exposed to radiation, including the small amounts used in CTs, X-rays, and other medical tests. Over time, exposure to radiation may cause cancer and other health problems. But in most cases, the risk of getting cancer from being exposed to small amounts of radiation is low. It's not a reason to avoid these tests for most people.  What are the benefits of screening?  Your scan may be normal (negative).  For some people who are at higher risk, screening lowers the chance of dying of lung cancer. How much and how long you smoked helps to determine your risk level. Screening can find some cancers early, when treatment may be more likely to work.  What happens after screening?  The results of your CT scan will be sent to your doctor. Someone from your care team will explain the results of your scan and answer any questions you may have. If you need any follow-up, he or she will help you understand what to do next.  After a lung cancer screening, you can go back to your usual activities right away.  A lung cancer screening test can't tell if you have lung cancer. If your results are positive, your doctor can't tell whether an abnormal finding is a harmless nodule, cancer, or something else without doing more tests.  What can you do to help prevent lung cancer?  Some lung cancers can't be prevented. But if you smoke, quitting smoking is the best step you can take to prevent lung cancer.  If you want to quit, your doctor can recommend medicines or other ways to help.  Follow-up care is a key part of your treatment and safety. Be sure to make and go to all appointments, and call your doctor if you are having problems. It's also a good idea to know your test results and keep a list of the medicines you take.  Where can you learn more?  Go to [employer] and enter Q940 to learn more about "Learning About Lung Cancer Screening."  Current as of: February 28, 2023Content Version: 13.8   2006-2023 [employer]wise, Incorporated.   Care instructions adapted under license by [facility]. If you have questions about a medical condition or this instruction, always ask your healthcare professional. Healthwise, Incorporated disclaims any warranty or liability for  your use of this information.           A Healthy Heart: Care Instructions  Your Care Instructions     Coronary artery disease, also called heart disease, occurs when a substance called plaque builds up in the vessels that supply oxygen-rich blood to your heart muscle. This can narrow the blood vessels and reduce blood flow. A heart attack happens when blood flow is completely blocked. A high-fat diet, smoking, and other factors increase the risk of heart disease.  Your doctor has found that you have a chance of having heart disease. You can do lots of things to keep your heart healthy. It may not be easy, but you can change your diet, exercise more, and quit smoking. These steps really work to lower your chance of heart disease.  Follow-up care is a key part of your treatment and safety. Be sure to make and go to all appointments, and call your doctor if you are having problems. It's also a good idea to know your test results and keep a list of the medicines you take.  How can you care for yourself at home?  Diet   Use less salt when you cook and eat. This helps lower your blood pressure. Taste food before salting.  Add only a little salt when you think you need it. With time, your taste buds will adjust to less salt.    Eat fewer snack items, fast foods, canned soups, and other high-salt, high-fat, processed foods.    Read food labels and try to avoid saturated and trans fats. They increase your risk of heart disease by raising cholesterol levels.    Limit the amount of solid fat-butter, margarine, and shortening-you eat. Use olive, peanut, or canola oil when you cook. Bake, broil, and steam foods instead of frying them.    Eat a variety of fruit and vegetables every day. Dark green, deep orange, red, or yellow fruits and vegetables are especially good for you. Examples include spinach, carrots, peaches, and berries.    Foods high in fiber can reduce your cholesterol and provide important vitamins and minerals. High-fiber foods include whole-grain cereals and breads, oatmeal, beans, brown rice, citrus fruits, and apples.    Eat lean proteins. Heart-healthy proteins include seafood, lean meats and poultry, eggs, beans, peas, nuts, seeds, and soy products.    Limit drinks and foods with added sugar. These include candy, desserts, and soda pop.   Lifestyle changes   If your doctor recommends it, get more exercise. Walking is a good choice. Bit by bit, increase the amount you walk every day. Try for at least 30 minutes on most days of the week. You also may want to swim, bike, or do other activities.    Do not smoke. If you need help quitting, talk to your doctor about stop-smoking programs and medicines. These can increase your chances of quitting for good. Quitting smoking may be the most important step you can take to protect your heart. It is never too late to quit.    Limit alcohol to 2 drinks a day for men and 1 drink a day for women. Too much alcohol can cause health problems.    Manage other health problems such as diabetes, high blood pressure, and high cholesterol. If you think you may have a problem with  alcohol or drug use, talk to your doctor.   Medicines   Take your medicines exactly as prescribed. Call your doctor if you think you  are having a problem with your medicine.    If your doctor recommends aspirin, take the amount directed each day. Make sure you take aspirin and not another kind of pain reliever, such as acetaminophen (Tylenol).   When should you call for help?   Call 911 if you have symptoms of a heart attack. These may include:   Chest pain or pressure, or a strange feeling in the chest.    Sweating.    Shortness of breath.    Pain, pressure, or a strange feeling in the back, neck, jaw, or upper belly or in one or both shoulders or arms.    Lightheadedness or sudden weakness.    A fast or irregular heartbeat.   After you call 911, the operator may tell you to chew 1 adult-strength or 2 to 4 low-dose aspirin. Wait for an ambulance. Do not try to drive yourself.  Watch closely for changes in your health, and be sure to contact your doctor if you have any problems.  Where can you learn more?  Go to [employer] and enter F075 to learn more about "A Healthy Heart: Care Instructions."  Current as of: June 25, 2023Content Version: 13.8   2006-2023 Healthwise, Incorporated.   Care instructions adapted under license by [facility]. If you have questions about a medical condition or this instruction, always ask your healthcare professional. Healthwise, Incorporated disclaims any warranty or liability for your use of this information.      Personalized Preventive Plan for JERELYN TRIMARCO - 10/23/2022  Medicare offers a range of preventive health benefits. Some of the tests and screenings are paid in full while other may be subject to a deductible, co-insurance, and/or copay.    Some of these benefits include a comprehensive review of your medical history including lifestyle, illnesses that may run in your family, and various assessments and screenings as  appropriate.    After reviewing your medical record and screening and assessments performed today your provider may have ordered immunizations, labs, imaging, and/or referrals for you.  A list of these orders (if applicable) as well as your Preventive Care list are included within your After Visit Summary for your review.    Other Preventive Recommendations:    A preventive eye exam performed by an eye specialist is recommended every 1-2 years to screen for glaucoma; cataracts, macular degeneration, and other eye disorders.  A preventive dental visit is recommended every 6 months.  Try to get at least 150 minutes of exercise per week or 10,000 steps per day on a pedometer .  Order or download the FREE "Exercise & Physical Activity: Your Everyday Guide" from [employer]. Call 5166968738 or search [employer] online.  You need 1200-1500 mg of calcium and 1000-2000 IU of vitamin D per day. It is possible to meet your calcium requirement with diet alone, but a vitamin D supplement is usually necessary to meet this goal.  When exposed to the sun, use a sunscreen that protects against both UVA and UVB radiation with an SPF of 30 or greater. Reapply every 2 to 3 hours or after sweating, drying off with a towel, or swimming.  Always wear a seat belt when traveling in a car. Always wear a helmet when riding a bicycle or motorcycle.

## 2022-10-24 LAB — HEPATIC FUNCTION PANEL
ALT: 10 U/L — ABNORMAL LOW (ref 12–65)
AST: 27 U/L (ref 15–37)
Albumin/Globulin Ratio: 0.7 (ref 0.4–1.6)
Albumin: 3 g/dL — ABNORMAL LOW (ref 3.2–4.6)
Alk Phosphatase: 132 U/L (ref 50–136)
Bilirubin, Direct: 0.3 MG/DL (ref <0.4)
Globulin: 4.1 g/dL (ref 2.8–4.5)
Total Bilirubin: 0.8 MG/DL (ref 0.2–1.1)
Total Protein: 7.1 g/dL (ref 6.3–8.2)

## 2022-10-27 ENCOUNTER — Encounter: Payer: MEDICARE | Primary: Internal Medicine

## 2022-10-28 ENCOUNTER — Encounter: Payer: MEDICARE | Attending: Family | Primary: Internal Medicine

## 2022-10-28 NOTE — Progress Notes (Incomplete)
Name:  Elizabeth Ingram  Date of Birth:  05/25/1961   MRN: [id number]      Office Visit: 10/28/2022        ASSESSMENT AND PLAN:  (Medical Decision Making)    Impression: 61 y.o. female ***    There are no diagnoses linked to this encounter.  No orders of the defined types were placed in this encounter.    No orders of the defined types were placed in this encounter.      Thomasene Ripple, APRN - CNP    No specialty comments available.    Total time for encounter on day of encounter was *** minutes.  This time includes chart prep, review of tests/procedures, review of other provider's notes, documentation and counseling patient regarding disease process and medications.   _________________________________________________________________________    HISTORY OF PRESENT ILLNESS:    Ms. Elizabeth Ingram is a 61 y.o. female who is seen at [facility] today for  No chief complaint on file.   ***    REVIEW OF SYSTEMS: 10 point review of systems is negative except as reported in HPI.    PHYSICAL EXAM: There is no height or weight on file to calculate BMI.  There were no vitals filed for this visit.      General:   Alert, cooperative, no distress, appears stated age.        Eyes:   Conjunctivae/corneas clear. PERRL        Mouth/Throat:  Lips, mucosa, and tongue normal. Teeth and gums normal.        Lungs:     ***     Heart:   Regular rate and rhythm, S1, S2 normal, no murmur, click, rub or gallop.     Abdomen:    Soft, non-tender.     Extremities:  Extremities normal, atraumatic, no cyanosis or edema.     Skin:  Skin color normal. No rashes or lesions     Neurologic:  A&Ox3     DIAGNOSTIC TESTS:                                                                                    LABS:   Lab Results   Component Value Date/Time    WBC 10.0 07/09/2022 09:27 AM    HGB 13.6 07/09/2022 09:27 AM    HCT 41.4 07/09/2022 09:27 AM    PLT 119 07/09/2022 09:27 AM    NTPROBNP 68 07/09/2022 09:27 AM     Imaging: I performed an independent  interpretation of the patient's images.  CXR:   XR CHEST LIMITED ONE VIEW 06/01/2022    Narrative  EXAM: XR CHEST 1 VW, 06/01/2022 2:02 PM    INDICATION: ;sob;    Comparison June 19    FINDINGS: Single view chest. Heart size normal. Lungs are clear. No effusion.    Impression  No acute cardiopulmonary abnormality    Signed by: 06/01/2022 2:13 PM: Truman Hayward MD, Yehuda Savannah    CT Chest:   CTA CHEST W WO CONTRAST 05/27/2022    Narrative  CTA PULMONARY EMBOLUS W/WO CONTRAST    INDICATION: PE suspected, high pretest prob;Pulmonary embolism (PE) suspected, positive D-dimer;  Chest pain, shortness of breath.    COMPARISON: None.    TECHNIQUE: Pulmonary CT angiography (CTPA) was performed with a thin-section multidetector volume acquisition of the chest during the bolus administration non-ionic contrast.  3D reconstructed images were rendered, including 3D coronal and sagittal angiographic MIPS. 3D axial, coronal and sagittal MIP images were stored on PACS and utilized for interpretation.    IOHEXOL 350 MG IODINE/ML INTRAVENOUS SOLUTION 6m    CTPA FINDINGS:  Pulmonary arteries:  Normal. No pulmonary thromboembolism.    Heart/vessels:  Normal heart size. No pericardial effusion. RV/LV ratio ?? 0.9 (Normal)  Aorta normal in caliber.    Mediastinum/hila:  Unremarkable.    Lungs/pleura:  No suspicious nodules or areas of consolidation. No pneumothorax or pleural effusion.    Trachea/central airways:  Unremarkable.    Upper abdomen:  Cirrhotic liver morphology.    MSK:  Unremarkable.    Impression  No evidence of pulmonary embolism.    Hepatic cirrhosis    Signed by: 05/27/2022 1:20 AM: TCoralyn MarkMD, AMinna Merritts   Nuclear Medicine: No results found for this or any previous visit from the past 3650 days.      PFTs:   Date         FVC    ***      FEV1    ***      FEV1/FVC    ***      FEF 25-75%    ***      Bronchodilator Response    ***      TLC    ***      RV    ***      DLCO    ***          FeNO: No results found for this or any previous  visit.  FeNO and Likelihood of Eosinophilic Asthma   Unlikely Intermediate Likely   <25 ppb 25-50 ppb >50ppb     Exercise Oximetry:    Echo: No results found for this or any previous visit from the past 3650 days.      PMH Reference Info:                                                                                                                Exposure History:  Second Hand Smoke Exposure: {YES/NO:19726::"No"}  Birds: {YES/NO:19726::"No"}  Asbestos: {YES/NO:19726::"No"}  TB: {YES/NO:19726::"No"}  Hot Tubs/Humidifier: {YES/NO:19726::"No"}  Organic/Inorganic Dusts: {YES/NO:19726::"No"}  Molds: {YES/NO:19726::"No"}  Occupation/Hobbies: ***  Immunization History   Administered Date(s) Administered    Hep A, HAVRIX, VAQTA, (age 19y+), IM, 159m02/11/2019    Hep A-Hep B, TWINRIX, (age 43y+), IM, 37m72m1/21/2018    Hepatitis B 01/19/2019, 05/30/2019    Influenza Virus Vaccine 10/19/2013, 10/28/2017    Influenza, FLUBLOK, (age 43 58), PF, 0.5mL90m/21/2018, 08/19/2018, 12/30/2019    Influenza, FLUCELVAX, (age 33 mo109), MDCK, PF, 0.5mL 44m16/2023    Pneumococcal, PCV-13, PREVNAR 13, (age 33w+), IM, 0.5mL 09m4/2019    Pneumococcal, PPSV23, PNEUMOVAX 23, (age 2y+), SC/IM, 0.5mL 04437m/2015  TDaP, ADACEL (age 22y-64y), Durwin Reges (age 10y+), IM, 0.54m 07/21/2017     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)     Cirrhosis of liver without ascites (HCC) 03/06/2021    Hypokalemia 10/09/2021    Nausea 05/20/2021    Obesity (BMI 30.0-34.9) 03/06/2021    Primary hypertension 03/06/2021    Stage 2 chronic kidney disease 03/06/2021    Stage 2 chronic kidney disease     [place]rombocytopenia ([place] 04/17/2021        Tobacco Use      Smoking status: Every Day        Packs/day: 0.50        Years: 40.00        Additional pack years: 0.00        Total pack years: 20.00        Types: Cigarettes      Smokeless tobacco: Never      Tobacco  comments: Patient down to 5-6 cigarettes a day    Allergies   Allergen Reactions    Gabapentin Other (See Comments)     Pt reports decreased respirations while taking this med     Current Outpatient Medications   Medication Instructions    albuterol sulfate HFA (PROVENTIL;VENTOLIN;PROAIR) 108 (90 Base) MCG/ACT inhaler INHALE 1 PUFF BY MOUTH EVERY 4 HOURS AS NEEDED FOR WHEEZING/SOB.    ALPRAZolam (XANAX) 1 mg, Oral, 2 times daily    amLODIPine (NORVASC) 10 mg, Oral, EVERY MORNING    buPROPion (WELLBUTRIN SR) 150 mg, Oral, 2 TIMES DAILY    citalopram (CELEXA) 40 mg, Oral, DAILY, Citalopram 40 mg tablet TAKE 1 TABLET BY MOUTH EVERY MORNING    fluticasone-salmeterol (ADVAIR) 250-50 MCG/ACT AEPB diskus inhaler Inhale 1 puff by mouth twice daily    fluticasone-umeclidin-vilant (TRELEGY ELLIPTA) 100-62.5-25 MCG/ACT AEPB inhaler 1 puff, Inhalation, DAILY    furosemide (LASIX) 20 mg, Oral, DAILY    ipratropium 0.5 mg-albuterol 2.5 mg (DUONEB) 0.5-2.5 (3) MG/3ML SOLN nebulizer solution 3 mLs, Inhalation, EVERY 6 HOURS PRN, INHALE CONTENTS OF 1 VIAL VIA NEBULIZER DAILY AS NEEDED FOR WHEEZING    lactulose (CHRONULAC) 10 GM/15ML solution TAKE 30 ML BY MOUTH TWICE DAILY    nadolol (CORGARD) 20 mg, Oral, DAILY    naloxone 4 MG/0.1ML LIQD nasal spray CALL 911. SPR CONTENTS OF ONE SPRAYER (0.1ML) INTO ONE NOSTRIL. REPEAT IN 2-3 MIN IF SYMPTOMS OF OPIOID EMERGENCY PERSIST, ALTERNATE NOSTRILS    nystatin (MYCOSTATIN) 100000 UNIT/ML suspension No dose, route, or frequency recorded.    omeprazole (PRILOSEC) 40 mg, Oral, DAILY RESP    ondansetron (ZOFRAN-ODT) 8 MG TBDP disintegrating tablet DISSOLVE 1 TABLET(8 MG) ON THE TONGUE THREE TIMES DAILY FOR UP TO 30 DOSES AS NEEDED FOR NAUSEA OR VOMITING    oxyCODONE (ROXICODONE) 5 mg, Oral, 2 TIMES DAILY PRN    potassium chloride (KLOR-CON M) 10 MEQ extended release tablet 10 mEq, Oral, 2 TIMES DAILY    Respiratory Therapy Supplies (NEBULIZER/TUBING/MOUTHPIECE) KIT 1 kit, Does not apply, DAILY  PRN

## 2022-11-04 ENCOUNTER — Encounter: Payer: MEDICARE | Primary: Internal Medicine

## 2022-11-05 ENCOUNTER — Encounter: Payer: MEDICARE | Attending: Cardiovascular Disease | Primary: Internal Medicine

## 2022-11-05 ENCOUNTER — Ambulatory Visit: Payer: MEDICARE | Primary: Internal Medicine

## 2022-11-05 DIAGNOSIS — Z87891 Personal history of nicotine dependence: Secondary | ICD-10-CM

## 2022-11-07 ENCOUNTER — Encounter

## 2022-11-07 MED ORDER — IPRATROPIUM-ALBUTEROL 0.5-2.5 (3) MG/3ML IN SOLN
0.5-2.533 (3) MG/3ML | Freq: Four times a day (QID) | RESPIRATORY_TRACT | 2 refills | Status: DC | PRN
Start: 2022-11-07 — End: 2023-01-22

## 2022-11-07 NOTE — Telephone Encounter (Signed)
-----   Message from Martinique J Grnak, DO sent at 11/06/2022 11:45 AM EST -----  CT scan shows possible pneumonia, recommendations are to follow-up closely.    Can you please try and get her scheduled with either Benjamine Mola or Vicente Males for follow-up within the next week or 2.

## 2022-11-07 NOTE — Telephone Encounter (Signed)
I called pt about her Ct Scan and made her an appt for the follow up with [facility] NP  Per Dr Darrel Hoover. Patient ask for a refill for her Albuterol for her [employer]

## 2022-11-18 ENCOUNTER — Encounter: Payer: MEDICARE | Attending: Cardiovascular Disease | Primary: Internal Medicine

## 2022-11-18 ENCOUNTER — Encounter: Payer: MEDICARE | Attending: Family | Primary: Internal Medicine

## 2022-11-18 NOTE — Telephone Encounter (Signed)
Patient called to schedule her 8:40 appointment with Loanne Drilling, NP, she overslept.  Patient di not want to reschedule at this time.

## 2022-11-22 ENCOUNTER — Encounter

## 2022-11-25 ENCOUNTER — Encounter

## 2022-11-25 MED ORDER — ALPRAZOLAM 0.5 MG PO TABS
0.5 MG | ORAL_TABLET | Freq: Two times a day (BID) | ORAL | 1 refills | Status: DC
Start: 2022-11-25 — End: 2023-01-22

## 2022-11-25 NOTE — Telephone Encounter (Signed)
From: Elizabeth Ingram  To: Dr. Martinique J Grnak  Sent: 11/24/2022 11:28 AM EST  Subject: Appointment Request    Appointment Request From: Elizabeth Ingram    With Provider: Martinique J Grnak, DO [facility]]    Preferred Date Range: Any    Preferred Times: Any Time    Reason for visit: Request an Appointment    Comments:  My test where he done on my lungs and talked to him about refill thanks

## 2022-11-27 NOTE — Telephone Encounter (Signed)
The pt called stating she is in need of refills for her K+, Amlodipine, and Xanax.  She would like these sent to [facility] on [facility].  Pt CB# [phone]    I reviewed the pts chart.  Last appt: 10/23/22  Next appt: 01/22/23  Rx Last Sent: K+ was sent in on 10/23/22 for 45 days with a rf; should last until at least her appt on 01/22/23  The Amlodipine was sent in on 10/23/22 for 90 NR; should last until 01/21/23  The Xanax Rx was corrected on 11/25/22 reflecting 2 po Qam and 2 po Qpm #120 x1 rf; should last until 01/24/23  They were all sent to [facility] on [facility]    I called and notified the pt of the above.  She verbalized understanding and agreed to contact the pharmacy to f/u on this.

## 2022-12-22 NOTE — Telephone Encounter (Signed)
Patient returned your call about her lab results

## 2022-12-22 NOTE — Telephone Encounter (Signed)
-----  Message from [facility] sent at 12/18/2022  2:46 PM EST -----  Subject: Message to Provider    QUESTIONS  Information for Provider? Pt is requesting her blood work results be faxed   to her Corporate investment banker, Dr. [place] with [facility] [place]. [address], [place], [place] [address], phone to ofc? [phone]. Pt did not have fax   #. Pt having procedure on the 02/01/22. Please call pt with any further   questions.   ---------------------------------------------------------------------------  --------------  Rod Can INFO  [phone]; OK to leave message on voicemail  ---------------------------------------------------------------------------  --------------  SCRIPT ANSWERS  Relationship to Patient? Self

## 2022-12-22 NOTE — Telephone Encounter (Signed)
Attempt to contact patient to discuss their concerns was unsuccessful.  Voicemail made to return call.

## 2022-12-23 NOTE — Telephone Encounter (Signed)
Spoke with patient notifying her that Dr.Barber can review her lab results via Care Everywhere through [facility].    Patient stated that she had them do that on yesterday.    Patient verbally expressed understanding and denied all additional questions, comments and/or concerns.

## 2022-12-25 NOTE — Telephone Encounter (Signed)
Attempt to contact patient to discuss their concerns was unsuccessful.  Voicemail made to return call.

## 2022-12-25 NOTE — Telephone Encounter (Signed)
-----  Message from Wynne Dust sent at 12/25/2022 12:16 PM EST -----  Subject: Message to Provider    QUESTIONS  Information for Provider? Pt returned call to the practice, no notes   showing. Pt stated it was Dr. Konrad Dolores nurse but she did not know what the   call was for. Practice appears to be at lunch; please attempt calling pt   again. LVM ok  ---------------------------------------------------------------------------  --------------  Rod Can INFO  [phone]; OK to leave message on voicemail  ---------------------------------------------------------------------------  --------------  SCRIPT ANSWERS  undefined

## 2022-12-30 ENCOUNTER — Encounter: Payer: MEDICARE | Primary: Internal Medicine

## 2023-01-01 NOTE — Telephone Encounter (Signed)
After unsuccessful attempts to contact patient regarding their request, this encounter will be closed and upon the patient resubmitting a request, the encounter will be re-opened.

## 2023-01-06 ENCOUNTER — Encounter: Payer: MEDICARE | Attending: Cardiovascular Disease | Primary: Internal Medicine

## 2023-01-06 NOTE — Progress Notes (Unsigned)
[facility] Follow Up                 Reason for Visit: Follow-up testing    Subjective:     Patient is a 62 y.o. female with a PMH of hypertension, substance abuse, cirrhosis, COPD, CKD stage III, and HCV who presents for follow-up.  The patient was last seen in September 2023.  An ESE was ordered for atypical chest pain and an abnormal ECG.  She was a no-show to her ESE.    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)     Cirrhosis of liver without ascites (HCC) 03/06/2021    Hypokalemia 10/09/2021    Nausea 05/20/2021    Obesity (BMI 30.0-34.9) 03/06/2021    Primary hypertension 03/06/2021    Stage 2 chronic kidney disease 03/06/2021    Stage 2 chronic kidney disease     Thrombocytopenia (HCC) 04/17/2021      Past Surgical History:   Procedure Laterality Date    NEUROLOGICAL SURGERY      nerve in left arm surgery 2009    ORTHOPEDIC SURGERY      x2 back     TUBAL LIGATION        Family History   Problem Relation Age of Onset    Colon Cancer Neg Hx     Breast Cancer Neg Hx     Ovarian Cancer Neg Hx       Social History     Tobacco Use    Smoking status: Every Day     Current packs/day: 0.50     Average packs/day: 0.5 packs/day for 40.0 years (20.0 ttl pk-yrs)     Types: Cigarettes    Smokeless tobacco: Never    Tobacco comments:     Patient down to 5-6 cigarettes a day   Substance Use Topics    Alcohol use: No      Allergies   Allergen Reactions    Gabapentin Other (See Comments)     Pt reports decreased respirations while taking this med         ROS:  No obvious pertinent positives on review of systems except for what was outlined above.       Objective:       There were no vitals taken for this visit.    BP Readings from Last 3 Encounters:   10/23/22 116/63   09/05/22 (!) 144/70   07/09/22 (!) 141/76       Wt Readings from Last 3 Encounters:   10/23/22 68 kg (150 lb)   09/05/22 65.8 kg (145 lb)    07/09/22 68.9 kg (152 lb)       General/Constitutional:   Alert and oriented x 3, no acute distress  HEENT:   normocephalic, atraumatic, moist mucous membranes  Neck:   No JVD or carotid bruits bilaterally  Cardiovascular:   regular rate and rhythm, no rub/gallop appreciated  Pulmonary:   clear to auscultation bilaterally, no respiratory distress  Abdomen:   soft, non-tender, non-distended  Ext:   No sig LE edema bilaterally  Skin:  warm and dry, no obvious rashes seen  Neuro:   no obvious sensory or motor deficits  Psychiatric:   normal mood and affect      ECG: ***    Data Review:   Lab Results   Component  Value Date    CHOL 137 03/06/2021     Lab Results   Component Value Date    TRIG 89 03/06/2021     Lab Results   Component Value Date    HDL 42 03/06/2021     Lab Results   Component Value Date    LDLCALC 78 03/06/2021     Lab Results   Component Value Date    VLDL 17 03/06/2021     No results found for: "CHOLHDLRATIO"     Lab Results   Component Value Date/Time    NA 142 07/09/2022 09:27 AM    NA 139 04/07/2022 03:25 PM    NA 142 01/08/2022 02:10 PM    K 3.1 07/09/2022 09:27 AM    K 3.7 04/07/2022 03:25 PM    K 3.3 01/08/2022 02:10 PM    CL 109 07/09/2022 09:27 AM    CL 109 04/07/2022 03:25 PM    CL 106 01/08/2022 02:10 PM    CO2 26 07/09/2022 09:27 AM    CO2 28 04/07/2022 03:25 PM    CO2 32 01/08/2022 02:10 PM    BUN 6 07/09/2022 09:27 AM    BUN 8 04/07/2022 03:25 PM    BUN 9 01/08/2022 02:10 PM    CREATININE 1.00 07/09/2022 09:27 AM    CREATININE 1.20 04/07/2022 03:25 PM    CREATININE 1.10 01/08/2022 02:10 PM    GLUCOSE 108 07/09/2022 09:27 AM    GLUCOSE 91 04/07/2022 03:25 PM    GLUCOSE 88 01/08/2022 02:10 PM    CALCIUM 9.1 07/09/2022 09:27 AM    CALCIUM 9.2 04/07/2022 03:25 PM    CALCIUM 9.0 01/08/2022 02:10 PM         Lab Results   Component Value Date    ALT 10 (L) 10/23/2022    ALT 16 07/09/2022    ALT 10 (L) 04/07/2022    AST 27 10/23/2022    AST 36 07/09/2022    AST 31 04/07/2022         Assessment/Plan:   There are no diagnoses linked to this encounter.    F/U: ***    Jillyn Hidden, MD

## 2023-01-19 ENCOUNTER — Encounter: Payer: MEDICARE | Attending: Family | Primary: Internal Medicine

## 2023-01-22 ENCOUNTER — Encounter: Admit: 2023-01-22 | Discharge: 2023-01-22 | Payer: MEDICARE | Attending: Internal Medicine | Primary: Internal Medicine

## 2023-01-22 ENCOUNTER — Ambulatory Visit: Admit: 2023-01-22 | Payer: MEDICARE | Primary: Internal Medicine

## 2023-01-22 ENCOUNTER — Encounter

## 2023-01-22 DIAGNOSIS — I10 Essential (primary) hypertension: Secondary | ICD-10-CM

## 2023-01-22 DIAGNOSIS — R9389 Abnormal findings on diagnostic imaging of other specified body structures: Secondary | ICD-10-CM

## 2023-01-22 MED ORDER — AMLODIPINE BESYLATE 10 MG PO TABS
10 MG | ORAL_TABLET | Freq: Every morning | ORAL | 0 refills | Status: AC
Start: 2023-01-22 — End: 2023-04-06

## 2023-01-22 MED ORDER — NADOLOL 20 MG PO TABS
20 | ORAL_TABLET | Freq: Every day | ORAL | 1 refills | Status: AC
Start: 2023-01-22 — End: ?

## 2023-01-22 MED ORDER — BUPROPION HCL ER (SR) 150 MG PO TB12
150 | ORAL_TABLET | Freq: Two times a day (BID) | ORAL | 0 refills | Status: AC
Start: 2023-01-22 — End: 2023-04-22

## 2023-01-22 MED ORDER — IPRATROPIUM-ALBUTEROL 0.5-2.5 (3) MG/3ML IN SOLN
Freq: Four times a day (QID) | RESPIRATORY_TRACT | 2 refills | Status: AC | PRN
Start: 2023-01-22 — End: ?

## 2023-01-22 MED ORDER — ONDANSETRON 8 MG PO TBDP
8 | ORAL_TABLET | ORAL | 3 refills | Status: AC
Start: 2023-01-22 — End: ?

## 2023-01-22 MED ORDER — FLUTICASONE-SALMETEROL 250-50 MCG/ACT IN AEPB
250-50 | RESPIRATORY_TRACT | 2 refills | Status: AC
Start: 2023-01-22 — End: ?

## 2023-01-22 MED ORDER — ALBUTEROL SULFATE HFA 108 (90 BASE) MCG/ACT IN AERS
108 | RESPIRATORY_TRACT | 3 refills | Status: AC
Start: 2023-01-22 — End: ?

## 2023-01-22 MED ORDER — FUROSEMIDE 20 MG PO TABS
20 | ORAL_TABLET | Freq: Every day | ORAL | 0 refills | Status: AC
Start: 2023-01-22 — End: 2023-04-22

## 2023-01-22 MED ORDER — CITALOPRAM HYDROBROMIDE 40 MG PO TABS
40 | ORAL_TABLET | Freq: Every day | ORAL | 0 refills | Status: AC
Start: 2023-01-22 — End: ?

## 2023-01-22 MED ORDER — TRELEGY ELLIPTA 100-62.5-25 MCG/ACT IN AEPB
Freq: Every day | RESPIRATORY_TRACT | 3 refills | Status: AC
Start: 2023-01-22 — End: ?

## 2023-01-22 MED ORDER — OMEPRAZOLE 40 MG PO CPDR
40 | ORAL_CAPSULE | Freq: Every day | ORAL | 3 refills | Status: AC
Start: 2023-01-22 — End: 2024-01-17

## 2023-01-22 MED ORDER — ALPRAZOLAM 0.5 MG PO TABS
0.5 MG | ORAL_TABLET | Freq: Two times a day (BID) | ORAL | 1 refills | Status: AC
Start: 2023-01-22 — End: 2023-04-22

## 2023-01-22 MED ORDER — POTASSIUM CHLORIDE CRYS ER 10 MEQ PO TBCR
10 | ORAL_TABLET | Freq: Two times a day (BID) | ORAL | 1 refills | Status: AC
Start: 2023-01-22 — End: ?

## 2023-01-22 NOTE — Progress Notes (Signed)
[place] Raylie Maddison, D.O.   [facility]  [place], [place]  Tel: [phone]    Office Visit: Follow Up     Patient Name: Elizabeth Ingram   DOB:  [birth year]   MRN:   [id number]      Today's Date: 01/22/23 1:35 PM    Subjective     The patient is a 62 y.o.-year-old female who presents for follow-up.    Hypertension  -Amlodipine 10 mg daily  -Nadolol 20 mg daily for esophageal varices prophylaxis  -Lasix 20 mg daily     Tobacco dependence due to cigarettes.  -Wellbutrin to 150 mg BID  -she states she is still smoking 2-3 cigarettes a day but she is vaping now.      Anxiety and depression, well controlled   -Xanax 2.5 mg twice daily  -Celexa 40 mg daily  -Wellbutrin 150 mg BID      Cirrhosis of the liver  -MELD-Na score of 9 (02/2021)  -Lasix 20 mg daily  -Spironolactone 50 mg daily, she has not been taking this.   -Nadalol (Corgard) daily 20 mg for esophageal varices  -Low-sodium diet (2 g), she is sticking this.   -Lactulose daily   -Patient had follow-up with GI on 08/25/2022, she will be checked biannually with [facility]  No other changes made at this visit.  -She has EGD scheduled for 01/28/2023.  -She will follow-up with GI in March 2024     GERD, well controlled   -Omeprazole 40 mg daily, she is taking this as needed  -She has EGD scheduled for 01/28/2023.     Overweight   -Weight is 154vpounds and BMI is 25.63 today  -She is eating healthy, but not exercising much.      Stage III CKD  -Patient is following with nephrology, she has only seen him one time.   -GFR from 07/09/2022 was greater than 60 and creatinine was 1.00     Bilateral leg edema  -she states she only has leg swelling when she is very active and walking a lot, but it is not much and when she elevates her legs it will go back down.   -Lasix 20 mg daily     COPD  -Trelegy 100-60 2.5-25 mcg/ACT 1 puff daily; she states this is working very well for her.   -Albuterol as needed, she has not been using it as  much since switching to vaping.   -Duonebs as needed  -Patient has appointment with pulmonology on 03/13/2023     Chronic Low back Pain  -She following with pain management monthly, last seen last month  -Oxycodone 5 mg; she has follow up this month. She would like to talk to her pain doctor about switching to Tramadol.      Heart murmur  -Patient last saw cardiology on 09/05/2022  -Patient has echocardiogram scheduled for 02/24/2023 and follow-up with cardiology on 03/03/2023  -she denies any new chest pain or SOB    Abnormal LDCT  -Low-dose CAT scan for lung cancer screening from 11/05/2022 showed a new areas of airspace consolidation within the right middle lobe and lingula.  Findings could represent the sequela of atypical bacterial infection/pneumonia, but follow-up until resolution is recommended.  Patient was supposed to follow-up with one of the NP's in my practice but failed to do so and did not follow-up after.  -She denies any SOB, Chest pain, fevers, or other symptoms today.  Health maintenance  -Due for colonoscopy in 2029  -Mammogram ordered  -Low-dose CAT due 11/06/2023  -Annual wellness visit due in June 2023  -Due for Pap smear in 2027    Today:   No other concerns today.     Review of Systems   All other systems reviewed and are negative.     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)     Cirrhosis of liver without ascites ([place]) 03/06/2021    Hypokalemia 10/09/2021    Nausea 05/20/2021    Obesity (BMI 30.0-34.9) 03/06/2021    Primary hypertension 03/06/2021    Stage 2 chronic kidney disease 03/06/2021    Stage 2 chronic kidney disease     Thrombocytopenia ([place]) 04/17/2021     Social History     Socioeconomic History    Marital status: Social worker     Spouse name: Not on file    Number of children: Not on file    Years of education: Not on file    Highest education level: Not on file    Occupational History    Not on file   Tobacco Use    Smoking status: Every Day     Current packs/day: 0.50     Average packs/day: 0.5 packs/day for 40.0 years (20.0 ttl pk-yrs)     Types: Cigarettes     Passive exposure: Current    Smokeless tobacco: Never    Tobacco comments:     Patient down to 5-6 cigarettes a day   Substance and Sexual Activity    Alcohol use: No    Drug use: No    Sexual activity: Yes     Partners: Male   Other Topics Concern    Not on file   Social History Narrative    Not on file     Social Determinants of Health     Financial Resource Strain: Low Risk  (01/22/2023)    Overall Financial Resource Strain (CARDIA)     Difficulty of Paying Living Expenses: Not hard at all   Food Insecurity: No Food Insecurity (01/22/2023)    Hunger Vital Sign     Worried About Running Out of Food in the Last Year: Never true     Ran Out of Food in the Last Year: Never true   Transportation Needs: Unknown (01/22/2023)    PRAPARE - Armed forces logistics/support/administrative officer (Medical): Not on file     Lack of Transportation (Non-Medical): No   Physical Activity: Sufficiently Active (10/23/2022)    Exercise Vital Sign     Days of Exercise per Week: 7 days     Minutes of Exercise per Session: 60 min   Stress: Not on file   Social Connections: Not on file   Intimate Partner Violence: Not on file   Housing Stability: Unknown (01/22/2023)    Housing Stability Vital Sign     Unable to Pay for Housing in the Last Year: Not on file     Number of Places Lived in the Last Year: Not on file     Unstable Housing in the Last Year: No         Current Outpatient Medications   Medication Sig    ALPRAZolam (XANAX) 0.5 MG tablet Take 2 tablets by mouth in the morning and at bedtime for 90  days. Max Daily Amount: 2 mg    ipratropium 0.5 mg-albuterol 2.5 mg (DUONEB) 0.5-2.5 (3) MG/3ML SOLN nebulizer solution Inhale 3 mLs into the lungs every 6 hours as needed for Shortness of Breath INHALE CONTENTS OF 1 VIAL VIA NEBULIZER DAILY AS NEEDED  FOR WHEEZING    amLODIPine (NORVASC) 10 MG tablet Take 1 tablet by mouth every morning    citalopram (CELEXA) 40 MG tablet Take 1 tablet by mouth daily Citalopram 40 mg tablet TAKE 1 TABLET BY MOUTH EVERY MORNING    omeprazole (PRILOSEC) 40 MG delayed release capsule Take 1 capsule by mouth daily    ondansetron (ZOFRAN-ODT) 8 MG TBDP disintegrating tablet DISSOLVE 1 TABLET(8 MG) ON THE TONGUE THREE TIMES DAILY FOR UP TO 30 DOSES AS NEEDED FOR NAUSEA OR VOMITING    buPROPion (WELLBUTRIN SR) 150 MG extended release tablet Take 1 tablet by mouth 2 times daily    furosemide (LASIX) 20 MG tablet Take 1 tablet by mouth daily    potassium chloride (KLOR-CON M) 10 MEQ extended release tablet Take 1 tablet by mouth 2 times daily    albuterol sulfate HFA (PROVENTIL;VENTOLIN;PROAIR) 108 (90 Base) MCG/ACT inhaler INHALE 1 PUFF BY MOUTH EVERY 4 HOURS AS NEEDED FOR WHEEZING/SOB.    fluticasone-umeclidin-vilant (TRELEGY ELLIPTA) 100-62.5-25 MCG/ACT AEPB inhaler Inhale 1 puff into the lungs daily    fluticasone-salmeterol (ADVAIR) 250-50 MCG/ACT AEPB diskus inhaler Inhale 1 puff by mouth twice daily    nadolol (CORGARD) 20 MG tablet Take 1 tablet by mouth daily    nystatin (MYCOSTATIN) 100000 UNIT/ML suspension     oxyCODONE (ROXICODONE) 5 MG immediate release tablet Take 1 tablet by mouth 2 times daily as needed.    naloxone 4 MG/0.1ML LIQD nasal spray CALL 911. SPR CONTENTS OF ONE SPRAYER (0.1ML) INTO ONE NOSTRIL. REPEAT IN 2-3 MIN IF SYMPTOMS OF OPIOID EMERGENCY PERSIST, ALTERNATE NOSTRILS    Respiratory Therapy Supplies (NEBULIZER/TUBING/MOUTHPIECE) KIT 1 kit by Does not apply route daily as needed (as needed for wheezing)    lactulose (CHRONULAC) 10 GM/15ML solution TAKE 30 ML BY MOUTH TWICE DAILY     No current facility-administered medications for this visit.        Objective     Vitals:    01/22/23 1308   BP: (!) 103/58   Site: Left Upper Arm   Position: Sitting   Cuff Size: Large Adult   Pulse: 65   Resp: 12   Temp: 98.3  F (36.8 C)   TempSrc: Temporal   SpO2: 96%   Weight: 69.9 kg (154 lb)   Height: 1.651 m (5' 5"$ )        Physical Exam:  Constitutional: Appears well kempt. Alert/oriented x3. In no acute distress.  Head: Normocephalic No trauma. No deformity.   Cardiac: Heart with normal rate/rhythm. No murmurs. No gallops. Pulses normal.   Pulmonary: Lungs clear to auscultation bilaterally. In no respiratory distress. No wheezing. No rales. No rhonci.   Psychiatric: Normal thought content. Normal behavior. Normal judgment.     Recommendations     Assessment:  Patient Active Problem List   Diagnosis    Hypertension    Anxiety and depression    Obesity (BMI 30.0-34.9)    Hepatic cirrhosis (HCC)    Stage 2 chronic kidney disease    Shortness of breath    Gastroesophageal reflux disease without esophagitis    Decreased visual acuity    Tobacco dependence    Chronic obstructive pulmonary disease, unspecified COPD type ([place])  Atypical chest pain    Abnormal ECG      Status of above conditions: Blood pressure mildly low.    Plan:  Overall impression: Patient is a pleasant 62 year old female who presents for follow-up.  She is doing well overall with no acute complaints.    Primary Hypertension  -Amlodipine 10 mg daily, discussed that patient's blood pressure today and last blood pressure check was mildly low.  She states her GI doctor would like to start her back on spironolactone for her liver.  I recommend that she come off amlodipine if this occurs.  Patient states she does not want to do spironolactone because she likes taking amlodipine.  -Nadolol 20 mg daily for esophageal varices prophylaxis  -Lasix 20 mg daily     Tobacco dependence due to cigarettes.  -Wellbutrin to 150 mg BID  -she states she is still smoking 2-3 cigarettes a day but she is vaping now.      Anxiety and depression, well controlled   -Xanax 2.5 mg twice daily  -Celexa 40 mg daily  -Wellbutrin 150 mg BID      Cirrhosis of the liver  -MELD-Na score of 9  (02/2021)  -Lasix 20 mg daily  -Spironolactone 50 mg daily, she has not been taking this.   -Nadalol (Corgard) daily 20 mg for esophageal varices  -Low-sodium diet (2 g), she is sticking this.   -Lactulose daily   -Patient had follow-up with GI on 08/25/2022, she will be checked [facility]ian[facility]ually with [facility]  No other changes made at this visit.  -She has EGD scheduled for 01/28/2023.  -She will follow-up with GI in March 2024     GERD, well controlled   -Omeprazole 40 mg daily, she is taking this as needed  -She has EGD scheduled for 01/28/2023.     Overweight   -Weight is 154vpounds and BMI is 25.63 today  -Continue diet and exercise     Stage III CKD  -GFR from 07/09/2022 was greater than 60 and creatinine was 1.00  -Check kidney function today, as long as kidney function remains stable patient can hold off on going back to the nephrologist.     Bilateral leg edema  -Continue elevation of her feet when her legs get swollen.  -Lasix 20 mg daily     COPD  -Trelegy 100-60 2.5-25 mcg/ACT 1 puff daily; she states this is working very well for her.   -Albuterol as needed, she has not been using it as much since switching to vaping.   -Duonebs as needed  -Patient has appointment with pulmonology on 03/13/2023     Chronic Low back Pain  -She following with pain management monthly, last seen last month  -Oxycodone 5 mg; she has follow up this month. She would like to talk to her pain doctor about switching to Tramadol.      Heart murmur  -Patient last saw cardiology on 09/05/2022  -Patient has echocardiogram scheduled for 02/24/2023 and follow-up with cardiology on 03/03/2023  -she denies any new chest pain or SOB    Abnormal LDCT  -Low-dose CAT scan for lung cancer screening from 11/05/2022 showed a new areas of airspace consolidation within the right middle lobe and lingula.  Findings could represent the sequela of atypical bacterial infection/pneumonia, but follow-up until resolution is recommended.  Patient was supposed to  follow-up with one of the NP's in my practice but failed to do so and did not follow-up after.  -She denies any SOB, Chest  pain, fevers, or other symptoms today.  -Check chest x-ray today  -She has pulmonary follow-up on 03/13/2023.     Health maintenance  -Due for colonoscopy in 2029  -Due for mammogram in 2024  -Low-dose CAT due 11/06/2023  -Annual wellness visit due in June 2023  -Due for Pap smear in 2027    -Previous medical records and/or labs/tests available to me reviewed, any records outstanding not available requested  -The risks, benefits, and medical necessity of all medications, tests labs, and any other orders that were ordered at today's visit were discussed with the patient including all elective or patient requested orders.  Decision to order or not order tests are based on this risk/benefit ratio and medical necessity.  -The patient expresses understanding of the plan as I've explained it to her and is in agreement with the current plan.    Follow Up: 4 months    Total Time: 30 minutes (chart reviewing and in-exam time)    Signed: [place] Cailey Trigueros, D.O.  01/22/23  1:35 PM

## 2023-01-23 LAB — CBC
Hematocrit: 41.4 % (ref 35.8–46.3)
Hemoglobin: 12.6 g/dL (ref 11.7–15.4)
MCH: 28.3 PG (ref 26.1–32.9)
MCHC: 30.4 g/dL — ABNORMAL LOW (ref 31.4–35.0)
MCV: 93 FL (ref 82–102)
MPV: 11.1 FL (ref 9.4–12.3)
Platelets: 84 10*3/uL — ABNORMAL LOW (ref 150–450)
RBC: 4.45 M/uL (ref 4.05–5.2)
RDW: 15.3 % — ABNORMAL HIGH (ref 11.9–14.6)
WBC: 5.4 10*3/uL (ref 4.3–11.1)
nRBC: 0 10*3/uL (ref 0.0–0.2)

## 2023-01-23 LAB — BASIC METABOLIC PANEL
Anion Gap: 3 mmol/L (ref 2–11)
BUN: 5 MG/DL — ABNORMAL LOW (ref 8–23)
CO2: 28 mmol/L (ref 21–32)
Calcium: 8.7 MG/DL (ref 8.3–10.4)
Chloride: 109 mmol/L (ref 103–113)
Creatinine: 0.8 MG/DL (ref 0.6–1.0)
Est, Glom Filt Rate: >60 mL/min/{1.73_m2} (ref >60)
Glucose: 83 mg/dL (ref 65–100)
Potassium: 3.6 mmol/L (ref 3.5–5.1)
Sodium: 140 mmol/L (ref 136–146)

## 2023-01-23 LAB — HEPATIC FUNCTION PANEL
ALT: 8 U/L — ABNORMAL LOW (ref 12–65)
AST: 22 U/L (ref 15–37)
Albumin/Globulin Ratio: 0.8 (ref 0.4–1.6)
Albumin: 3.2 g/dL (ref 3.2–4.6)
Alk Phosphatase: 153 U/L — ABNORMAL HIGH (ref 50–136)
Bilirubin, Direct: 0.5 MG/DL — ABNORMAL HIGH (ref <0.4)
Globulin: 4.1 g/dL (ref 2.8–4.5)
Total Bilirubin: 1.5 MG/DL — ABNORMAL HIGH (ref 0.2–1.1)
Total Protein: 7.3 g/dL (ref 6.3–8.2)

## 2023-01-23 NOTE — Other (Signed)
Patient's chest x-ray did not show any acute findings of the chest.

## 2023-01-23 NOTE — Other (Signed)
Patient's liver panel was slightly worse than 3 months ago, she should follow-up with her GI doctor concerning this.

## 2023-02-17 ENCOUNTER — Telehealth

## 2023-02-17 NOTE — Telephone Encounter (Signed)
-----   Message from [place] sent at 02/16/2023  1:58 PM EDT -----  Subject: Referral Request    Reason for referral request? Pt was calling to see if she could get the   dermatology referral re-issued. She didn't go when it was setup. Please   contact the pt  Provider patient wants to be referred to(if known):     Provider Phone Number(if known):    Additional Information for Provider?   ---------------------------------------------------------------------------  --------------  [place]    [phone]; OK to leave message on voicemail  ---------------------------------------------------------------------------  --------------

## 2023-02-20 NOTE — H&P (Signed)
Formatting of this note is different from the original.  Inpatient Liver History and [facility]    Patient Name: Elizabeth Ingram  Age: 62 y.o.  Sex: female  MRN: [id number]  Date: 02/21/2023  Service: Liver    Primary Care Physician: Martinique J GRNAK, DO    Chief complaint: Abdominal pain     History of Present Illness:   Elizabeth Ingram is a 62 y.o. 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    Per OSH records, patient presented to ED with worsening RUQ tenderness.  She has chronic history of similar complaints dating back at least several years and has been worked up for chronic abdominal pain and undergone several negative ERCPs.  She described this pain to OSH physicians as sharp in nature that is associated with diarrhea.    At OSH, vitals notable for BP 120/63, HR 68, RR 18, temp afebrile, O2 97%.  Labs significant for WBC 2.4, hemoglobin 9.9, platelets 51, AST 38, ALT <6, alk phos 132, lipase 53, creatinine 0.95.  RUQ [place] 7 4 CBD 11.1 mm, cholelithiasis, and some gallbladder wall thickening with positive sonographic Murphy sign. HIDA scan performed showed patency of cystic duct.   She was given morphine 4 mg x 3, Zofran,Norco, and 1 L LR.  Acute surgery at OSH accepted to their service but recommended transfer/evaluation [facility] for surgical evaluation and admission to liver service due to history of cirrhosis.     Upon arrival to [facility], patient vitals notable for BP 149/72, HR 64, SpO2 97%.     Upon bedside interview, patient reports onset of RUQ pain 3-4 days prior. She describes it as sharp pain worsening with PO intake that radiates into her back rated 8/10 in severity. She denies nausea, vomiting, melena, hematochezia, chest pain, SOB, diarrhea, jaundice, fever, chills.     Liver History:  She states that she was diagnosed with liver cirrhosis 10 years ago and has been  treated for her Hepatitis C. She currently follows w/ Dr. Dot Been of [facility] for her cirrhosis. She had undergone evaluation for transplant but was told her cirrhosis was stable and that she did not require one.     Review of Systems:   Review of Systems   Constitutional:  Negative for chills and fever.   Respiratory:  Negative for cough and shortness of breath.    Cardiovascular:  Negative for chest pain and leg swelling.   Gastrointestinal:  Positive for abdominal pain. Negative for blood in stool, diarrhea, melena, nausea and vomiting.     All other systems reviewed and are negative    Medical History     PAST MEDICAL HISTORY:   Past Medical History:   Diagnosis Date    COPD (chronic obstructive pulmonary disease)     Hepatic cirrhosis due to chronic hepatitis C infection     Hypertension     Hypertension     Mood disorder      PAST SURGICAL HISTORY:   Past Surgical History:   Procedure Laterality Date    BACK SURGERY  2014    Laminectomry    BACK SURGERY  2015    laminectomy    UPPER GASTROINTESTINAL ENDOSCOPY  05/2021    Removal of stent by ERCP     SOCIAL HISTORY: Smokes 0.5-1 pack per day, 40 pack year history, denies drinking  EtOH or current drug use. Lives in [place]: Cirrhosis in 2 brothers and mother    ALLERGIES: Review of patient's allergies indicates:  Not on File    Current Outpatient Medications     No current facility-administered medications on file prior to encounter.     Current Outpatient Medications on File Prior to Encounter   Medication Sig Dispense Refill    albuterol 0.083 % (Proventil) 2.5 mg /3 mL (0.083 %) nebulizer solution Inhale 3 mL into the lungs every 6 hours as needed for shortness of breath.      albuterol 90 mcg/actuation inhaler Inhale 2 puffs into the lungs every 6 hours as needed for wheezing.      ALPRAZolam (Xanax) 0.5 mg tablet Take 1 tablet by mouth 2 times daily.      amLODIPine (Norvasc) 10 mg tablet Take 1 tablet by mouth every  evening.      citalopram (Celexa) 40 mg tablet Take 1 tablet by mouth daily.      fluticasone propionate-salmeterol (Advair Diskus) 250-50 mcg/dose powder inhaler Inhale 1 puff into the lungs 2 times daily. Rinse mouth without swallowing after use.      fluticasone-umeclidinium-vilanterol (Trelegy Ellipta) 100-62.5-25 mcg/dose powder inhaler Inhale 1 puff into the lungs once daily. Rinse mouth without swallowing after use.      furosemide (Lasix) 40 mg tablet Take 1 tablet by mouth daily as needed (as needed).      lactulose (Duphalac) 10 gram/15 mL (15 mL) Solution Take 30 mL by mouth 2 times daily.      nadoloL (Corgard) 20 mg tablet Take 1 tablet by mouth daily.      omeprazole (Prilosec) 40 mg delayed release capsule Take 1 capsule by mouth twice daily before breakfast & dinner. Take 30 min-1 hr before eating. Capsule can be opened, contents mixed with 1 tablespoon of applesauce and swallowed immediately without chewing.      ondansetron (Zofran) 4 mg tablet Take 1 tablet by mouth every 8 hours as needed for nausea.      ondansetron (Zofran) 8 mg tablet Take 1 tablet by mouth every 8 hours as needed for nausea.      oxyCODONE (OXY-IR) 5 mg immediate release capsule Take 1 capsule by mouth 2 times daily.      potassium chloride (Klor-Con M20) 20 mEq extended release tablet Take 1 tablet by mouth daily.         Physical Exam   Vitals: BP 149/72 (BP Location: Right arm, Patient Position: Lying)   Pulse 64   Temp 37.2 C (99 F) (Oral)   Resp 18   Ht 165.1 cm (5\' 5" )   Wt 67.1 kg (148 lb)   SpO2 97%   BMI 24.63 kg/m     Physical Exam  General: WN/WD female laying comfortably in NAD.   Head: NC/AT, no sinus pain or tenderness  Eyes: PERRL, EOMI, sclerae anicteric and without injection  ENMT: MMM and without erythema or exudates, no lesions  Neck: Supple and NT without JVD, LAD, or Thyromegaly   Cardiovascular: RRR with normal S1, S2. No m/r/g. No pedal edema.  Respiratory: Lungs CTAB without wheezes,  crackles, rhonchi, or rales. Good air movement bilaterally  GI: Abdomen soft, tender to shallow palpation over RUQ   Extremities: No c/c/e, pulses 2+ throughout  MSK: Strength 5/5 throughout  Neuro/Psych: A&OX3. No focal deficits.     Radiology & Diagnostics     EKG: Pending  MRI Abdomen W Wo Contrast MRCP    (Results Pending)     Labs     No results for input(s): "WBC", "HGB", "PLT" in the last 72 hours.    No results for input(s): "NA", "K", "BUN", "CREATININE", "MG", "PHOS" in the last 72 hours.    Invalid input(s): "CHLORIDE", "CO2"    No results for input(s): "BILITOT", "AST", "ALT", "ALKPHOS", "PROT" in the last 72 hours.    Invalid input(s): "ALB"    No results for input(s): "INR", "PTT" in the last 72 hours.    Invalid input(s): "PT"    No results found for: "GLUCOSEU", "PROTEINUA", "BILIRUBINUR", "UROBILINOGEN", "LABPH", "BLOODU", "KETONESU", "NITRITE", "LEUKOCYTESUR", "LABSPEC", "COLORU"    Assessment & Plan   Elizabeth Ingram is a 62 y.o. 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 Bilirubin only 0.8, HIDA scan showed normal filling of gallbladder, no jaundice on exam or fevers in history, low concern for choledocholithiasis or cholangitis.   -MRCP today to definitively evaluate for stones in duct  -Infectious work-up   -Blood cx x2   -Urinalysis  -Start zosyn for broad coverage  -If stones discovered, consider ERCP for removal  -Consult surgery in AM for cholecystectomy    #Decompensated cirrhosis c/b ascites, HE, BEV:  - Volume: Lasix 40 mg PRN, continue   - Infection: Remote history of SBP, not on ppx   - Bleeding: hx of varices, continue nadolol; no TIPS  - Encephalopathy: Continue lactulose  20 g BID   - Screening: MRCP today   - Transplant Candidacy: TBD     Computed MELD 3.0 unavailable. One or more values for this score either were not found within the given timeframe or did not fit some other criterion.  Computed MELD-Na unavailable. One or more values for this score either were not found within the given timeframe or did not fit some other criterion.    #COPD:   - Continue home inhalers    #Hypertension:   - Continue home amlodipine     #GERD:   - Continue home PPI BID    #Mood Disorder:   - Continue home celexa 40 mg  - Contine home xanax 0.5 mg BID     #Coagulopathy: Present on admission. Likely in setting of liver disease.  - CTM with daily PT/INR  - Will consider FFP or Vit k prn depending on clinical status and need for procedures    #Thrombocytopenia: Likely in setting of liver diease  - CTM with daily CBC    FEN/Prophylaxis:  Diet: NPO p MN. Not on maintenance IVF. Replete lytes PRN. SCDs only (thrombocytopenia).     Dispo: Admit to GI-Liver     Code status: FULL     Rosario Adie, M.D  Internal Medicine, PGY-3  [facility]  Pager: [phone]    I have seen, examined, and discussed this patient with Dr. Posey Pronto and agree with the findings, assessment, and plan.  Cirrhosis and [place] admitted for ab pain.  Has GS on imaging  Since no cholangitis, will D/C with plans for outpatient ERCP and transpapillary stent  Margarita Grizzle, MD, [facility] (Attending)    Electronically signed by Margarita Grizzle, MD MSCR at 02/21/2023  8:49 AM EDT

## 2023-02-20 NOTE — H&P (Signed)
Formatting of this note is different from the original.  Inpatient Liver History and [place] [facility] [place]    Patient Name: Elizabeth Ingram  Age: 62 y.o.  Sex: female  MRN: [id number]  Date: 02/21/2023  Service: Liver    Primary Care Physician: [place] J GRNAK, DO    Chief complaint: Abdominal pain     History of Present Illness:   Elizabeth Ingram is a 62 y.o. 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    Per OSH records, patient presented to ED with worsening RUQ tenderness.  She has chronic history of similar complaints dating back at least several years and has been worked up for chronic abdominal pain and undergone several negative ERCPs.  She described this pain to OSH physicians as sharp in nature that is associated with diarrhea.    At OSH, vitals notable for BP 120/63, HR 68, RR 18, temp afebrile, O2 97%.  Labs significant for WBC 2.4, hemoglobin 9.9, platelets 51, AST 38, ALT <6, alk phos 132, lipase 53, creatinine 0.95.  RUQ [place] 7 4 CBD 11.1 mm, cholelithiasis, and some gallbladder wall thickening with positive sonographic Murphy sign. HIDA scan performed showed patency of cystic duct.   She was given morphine 4 mg x 3, Zofran,Norco, and 1 L LR.  Acute surgery at OSH accepted to their service but recommended transfer/evaluation [facility] for surgical evaluation and admission to liver service due to history of cirrhosis.     Upon arrival to [facility], patient vitals notable for BP 149/72, HR 64, SpO2 97%.     Upon bedside interview, patient reports onset of RUQ pain 3-4 days prior. She describes it as sharp pain worsening with PO intake that radiates into her back rated 8/10 in severity. She denies nausea, vomiting, melena, hematochezia, chest pain, SOB, diarrhea, jaundice, fever, chills.     Liver History:  She states that she was diagnosed with liver cirrhosis 10 years ago and has been  treated for her Hepatitis C. She currently follows w/ Dr. Dot Been of [facility] for her cirrhosis. She had undergone evaluation for transplant but was told her cirrhosis was stable and that she did not require one.     Review of Systems:   Review of Systems   Constitutional:  Negative for chills and fever.   Respiratory:  Negative for cough and shortness of breath.    Cardiovascular:  Negative for chest pain and leg swelling.   Gastrointestinal:  Positive for abdominal pain. Negative for blood in stool, diarrhea, melena, nausea and vomiting.     All other systems reviewed and are negative    Medical History     PAST MEDICAL HISTORY:   Past Medical History:   Diagnosis Date    COPD (chronic obstructive pulmonary disease)     Hepatic cirrhosis due to chronic hepatitis C infection     Hypertension     Hypertension     Mood disorder      PAST SURGICAL HISTORY:   Past Surgical History:   Procedure Laterality Date    BACK SURGERY  2014    Laminectomry    BACK SURGERY  2015    laminectomy    UPPER GASTROINTESTINAL ENDOSCOPY  05/2021    Removal of stent by ERCP     SOCIAL HISTORY: Smokes 0.5-1 pack per day, 40 pack year history, denies drinking  EtOH or current drug use. Lives in [place]: Cirrhosis in 2 brothers and mother    ALLERGIES: Review of patient's allergies indicates:  Not on File    Current Outpatient Medications     No current facility-administered medications on file prior to encounter.     Current Outpatient Medications on File Prior to Encounter   Medication Sig Dispense Refill    albuterol 0.083 % (Proventil) 2.5 mg /3 mL (0.083 %) nebulizer solution Inhale 3 mL into the lungs every 6 hours as needed for shortness of breath.      albuterol 90 mcg/actuation inhaler Inhale 2 puffs into the lungs every 6 hours as needed for wheezing.      ALPRAZolam (Xanax) 0.5 mg tablet Take 1 tablet by mouth 2 times daily.      amLODIPine (Norvasc) 10 mg tablet Take 1 tablet by mouth every  evening.      citalopram (Celexa) 40 mg tablet Take 1 tablet by mouth daily.      fluticasone propionate-salmeterol (Advair Diskus) 250-50 mcg/dose powder inhaler Inhale 1 puff into the lungs 2 times daily. Rinse mouth without swallowing after use.      fluticasone-umeclidinium-vilanterol (Trelegy Ellipta) 100-62.5-25 mcg/dose powder inhaler Inhale 1 puff into the lungs once daily. Rinse mouth without swallowing after use.      furosemide (Lasix) 40 mg tablet Take 1 tablet by mouth daily as needed (as needed).      lactulose (Duphalac) 10 gram/15 mL (15 mL) Solution Take 30 mL by mouth 2 times daily.      nadoloL (Corgard) 20 mg tablet Take 1 tablet by mouth daily.      omeprazole (Prilosec) 40 mg delayed release capsule Take 1 capsule by mouth twice daily before breakfast & dinner. Take 30 min-1 hr before eating. Capsule can be opened, contents mixed with 1 tablespoon of applesauce and swallowed immediately without chewing.      ondansetron (Zofran) 4 mg tablet Take 1 tablet by mouth every 8 hours as needed for nausea.      ondansetron (Zofran) 8 mg tablet Take 1 tablet by mouth every 8 hours as needed for nausea.      oxyCODONE (OXY-IR) 5 mg immediate release capsule Take 1 capsule by mouth 2 times daily.      potassium chloride (Klor-Con M20) 20 mEq extended release tablet Take 1 tablet by mouth daily.         Physical Exam   Vitals: BP 149/72 (BP Location: Right arm, Patient Position: Lying)   Pulse 64   Temp 37.2 C (99 F) (Oral)   Resp 18   Ht 165.1 cm (5\' 5" )   Wt 67.1 kg (148 lb)   SpO2 97%   BMI 24.63 kg/m     Physical Exam  General: WN/WD female laying comfortably in NAD.   Head: NC/AT, no sinus pain or tenderness  Eyes: PERRL, EOMI, sclerae anicteric and without injection  ENMT: MMM and without erythema or exudates, no lesions  Neck: Supple and NT without JVD, LAD, or Thyromegaly   Cardiovascular: RRR with normal S1, S2. No m/r/g. No pedal edema.  Respiratory: Lungs CTAB without wheezes,  crackles, rhonchi, or rales. Good air movement bilaterally  GI: Abdomen soft, tender to shallow palpation over RUQ   Extremities: No c/c/e, pulses 2+ throughout  MSK: Strength 5/5 throughout  Neuro/Psych: A&OX3. No focal deficits.     Radiology & Diagnostics     EKG: Pending  MRI Abdomen W Wo Contrast MRCP    (Results Pending)     Labs     No results for input(s): "WBC", "HGB", "PLT" in the last 72 hours.    No results for input(s): "NA", "K", "BUN", "CREATININE", "MG", "PHOS" in the last 72 hours.    Invalid input(s): "CHLORIDE", "CO2"    No results for input(s): "BILITOT", "AST", "ALT", "ALKPHOS", "PROT" in the last 72 hours.    Invalid input(s): "ALB"    No results for input(s): "INR", "PTT" in the last 72 hours.    Invalid input(s): "PT"    No results found for: "GLUCOSEU", "PROTEINUA", "BILIRUBINUR", "UROBILINOGEN", "LABPH", "BLOODU", "KETONESU", "NITRITE", "LEUKOCYTESUR", "LABSPEC", "COLORU"    Assessment & Plan   Elizabeth Ingram is a 62 y.o. 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 Bilirubin only 0.8, HIDA scan showed normal filling of gallbladder, no jaundice on exam or fevers in history, low concern for choledocholithiasis or cholangitis.   -MRCP today to definitively evaluate for stones in duct  -Infectious work-up   -Blood cx x2   -Urinalysis  -Start zosyn for broad coverage  -If stones discovered, consider ERCP for removal  -Consult surgery in AM for cholecystectomy    #Decompensated cirrhosis c/b ascites, HE, BEV:  - Volume: Lasix 40 mg PRN, continue   - Infection: Remote history of SBP, not on ppx   - Bleeding: hx of varices, continue nadolol; no TIPS  - Encephalopathy: Continue lactulose  20 g BID   - Screening: MRCP today   - Transplant Candidacy: TBD     Computed MELD 3.0 unavailable. One or more values for this score either were not found within the given timeframe or did not fit some other criterion.  Computed MELD-Na unavailable. One or more values for this score either were not found within the given timeframe or did not fit some other criterion.    #COPD:   - Continue home inhalers    #Hypertension:   - Continue home amlodipine     #GERD:   - Continue home PPI BID    #Mood Disorder:   - Continue home celexa 40 mg  - Contine home xanax 0.5 mg BID     #Coagulopathy: Present on admission. Likely in setting of liver disease.  - CTM with daily PT/INR  - Will consider FFP or Vit k prn depending on clinical status and need for procedures    #Thrombocytopenia: Likely in setting of liver diease  - CTM with daily CBC    FEN/Prophylaxis:  Diet: NPO p MN. Not on maintenance IVF. Replete lytes PRN. SCDs only (thrombocytopenia).     Dispo: Admit to GI-Liver     Code status: FULL     Rosario Adie, M.D  Internal Medicine, PGY-3  [facility] [place]  Pager: [phone]    I have seen, examined, and discussed this patient with Dr. Posey Pronto and agree with the findings, assessment, and plan.  Cirrhosis and [facility] admitted for ab pain.  Has GS on imaging  Since no cholangitis, will D/C with plans for outpatient ERCP and transpapillary stent  Margarita Grizzle, MD, [facility] (Attending)    Electronically signed by Margarita Grizzle, MD MSCR at 02/21/2023  8:49 AM EDT

## 2023-02-21 NOTE — Unmapped (Signed)
Formatting of this note might be different from the original.  SW attempted to speak with pt; however, she was taken for and MRI. SW contact pt son, Aydriana Kostrzewa [phone]). Belenda Cruise reported he lives in [place] and do not have transportation. Belenda Cruise expressed frustrations about his mother being transferred. SW questioned if there are any other family or friends that could assist. Belenda Cruise reported all family members that reside is [facility] are deceased.     SW will attempted to meet with pt.    Rennis Chris, LMSW  Office: [phone]  Pager# [phone]    Addendum:  1:08 pm - SW met with pt at bedside to discuss discharge transportation. When asked, pt reported she did not have any family to assist with  transportation. Pt reported she will discharge to the [facility] in [place] off of [address]. SW informed pt that SW will follow-up with her regarding transportation.     SW spoke with pt nurse, Jonita Albee [phone]) and she confirmed pt will discharge.    SW attempted to scheduled transport. SW contact transportation [phone]) and spoke with Darrell. Darrell reported he is unsure if they will be able to provide transportation today due to the distance of the transport. Darrell reported he will work on possibly confirm transport and follow up with SW. SW provided contact information.     SW received update from transportation. Transportation unavailable. SW followed up with leader on call and was advised to complete an EF for [employer].     SW met with pt at bedside to confirm she can return to the [facility]. Pt attempted to contact the shelter [phone]) but was prompt  to leave a message. Pt reported she must arrive by 9pm or she will not be allowed in. Pt reported she has a room (Rm #5).      SW attempted to contact the [facility] and was prompt  to leave a message. SW requested a return call regarding pt discharging.    SW received a telephone contact  from MD reporting the pt confirmed she can return the [facility]. Pt will discharge tomorrow. MD requested 12:30 transport time.     SW contact Deirdre Peer to schedule discharge transportation. Discharge scheduled for Sunday February 22, 2023 at 12:30 pm using [id number].    CM/SW will be using [employer] to assist with payment for patient's transport for discharge. Patient will be transported via Deirdre Peer from [facility] to [address] [place] [address].   The following assessment was completed with patient/support person to determine eligibility for assistance.  EF #[id number]  used.   Financial Assessment:   Monthly Income:  - Patient is indigent and  meets Federal Poverty Guidelines.   [facility] is assigned to patient and FAP has been initiated.   Resources patient has attempted to access to obtain financial support or alternative options:  pt son unable to assist due to limited finances and no transportation  Transportation is required for discharge.  Yes      Electronically signed by Rennis Chris at 02/21/2023  4:34 PM EDT

## 2023-02-21 NOTE — Progress Notes (Signed)
Formatting of this note might be different from the original.    Problem: Adult Inpatient Plan of Care  Goal: Plan of Care Review  Outcome: Ongoing, Progressing  Goal: Patient-Specific Goal (Individualization)  Outcome: Ongoing, Progressing  Goal: Absence of Hospital-Acquired Illness or Injury  Outcome: Ongoing, Progressing  Goal: Optimal Comfort and Wellbeing  Outcome: Ongoing, Progressing  Goal: Readiness for Transition of Care  Outcome: Ongoing, Progressing  Goal: Rounds/Family Conference  Outcome: Ongoing, Progressing    Problem: Pain Acute  Goal: Optimal Pain Control  Outcome: Ongoing, Progressing    Electronically signed by York Pellant, RN at 02/21/2023  1:16 AM EDT

## 2023-02-21 NOTE — Progress Notes (Signed)
Formatting of this note might be different from the original.    Problem: Adult Inpatient Plan of Care  Goal: Plan of Care Review  Outcome: Ongoing, Progressing    Problem: Adult Inpatient Plan of Care  Goal: Patient-Specific Goal (Individualization)  Outcome: Ongoing, Progressing    Problem: Adult Inpatient Plan of Care  Goal: Absence of Hospital-Acquired Illness or Injury  Outcome: Ongoing, Progressing    Problem: Adult Inpatient Plan of Care  Goal: Optimal Comfort and Wellbeing  Outcome: Ongoing, Progressing    Problem: Pain Acute  Goal: Optimal Pain Control  Outcome: Ongoing, Progressing    Electronically signed by Dionne Milo, RN at 02/21/2023  6:25 PM EDT

## 2023-02-21 NOTE — Progress Notes (Signed)
Formatting of this note might be different from the original.    Problem: Adult Inpatient Plan of Care  Goal: Plan of Care Review  Outcome: Ongoing, Progressing  Goal: Patient-Specific Goal (Individualization)  Outcome: Ongoing, Progressing  Goal: Absence of Hospital-Acquired Illness or Injury  Outcome: Ongoing, Progressing  Goal: Optimal Comfort and Wellbeing  Outcome: Ongoing, Progressing  Goal: Readiness for Transition of Care  Outcome: Ongoing, Progressing  Goal: Rounds/Family Conference  Outcome: Ongoing, Progressing    Problem: Pain Acute  Goal: Optimal Pain Control  Outcome: Ongoing, Progressing    Electronically signed by York Pellant, RN at 02/21/2023 10:18 PM EDT

## 2023-02-21 NOTE — Unmapped (Signed)
Formatting of this note might be different from the original.  SW attempted to speak with pt; however, she was taken for and MRI. SW contact pt son, Verbal Holsten [phone]). Belenda Cruise reported he lives in [place] and do not have transportation. Belenda Cruise expressed frustrations about his mother being transferred. SW questioned if there are any other family or friends that could assist. Belenda Cruise reported all family members that reside is [facility] are deceased.     SW will attempted to meet with pt.    Rennis Chris, LMSW  Office: [phone]  Pager# [phone]    Addendum:  1:08 pm - SW met with pt at bedside to discuss discharge transportation. When asked, pt reported she did not have any family to assist with  transportation. Pt reported she will discharge to the [facility] in [place] off of [address]. SW informed pt that SW will follow-up with her regarding transportation.     SW spoke with pt nurse, Jonita Albee [phone]) and she confirmed pt will discharge.    SW attempted to scheduled transport. SW contact transportation [phone]) and spoke with Darrell. Darrell reported he is unsure if they will be able to provide transportation today due to the distance of the transport. Darrell reported he will work on possibly confirm transport and follow up with SW. SW provided contact information.     SW received update from transportation. Transportation unavailable. SW followed up with leader on call and was advised to complete an EF for [employer].     SW met with pt at bedside to confirm she can return to the [facility]. Pt attempted to contact the shelter [phone]) but was prompt  to leave a message. Pt reported she must arrive by 9pm or she will not be allowed in. Pt reported she has a room (Rm #5).      SW attempted to contact the [facility] and was prompt  to leave a message. SW requested a return call regarding pt discharging.    SW received a telephone contact  from MD reporting the pt confirmed she can return the [facility]. Pt will discharge tomorrow. MD requested 12:30 transport time.     SW contact Deirdre Peer to schedule discharge transportation. Discharge scheduled for Sunday February 22, 2023 at 12:30 pm using [id number].    CM/SW will be using [employer] to assist with payment for patient's transport for discharge. Patient will be transported via Deirdre Peer from [facility] to [address] [place] [address].   The following assessment was completed with patient/support person to determine eligibility for assistance.  EF #[id number]  used.   Financial Assessment:   Monthly Income:  - Patient is indigent and  meets Federal Poverty Guidelines.   [facility] is assigned to patient and FAP has been initiated.   Resources patient has attempted to access to obtain financial support or alternative options:  pt son unable to assist due to limited finances and no transportation  Transportation is required for discharge.  Yes      Electronically signed by Rennis Chris at 02/21/2023  4:34 PM EDT

## 2023-02-22 NOTE — Nursing Note (Signed)
Formatting of this note might be different from the original.  Called Caren Griffins (green tax driver) and updated about the scenario with the patient.   Electronically signed by Modesto Charon, RN at 02/22/2023  1:21 PM EDT

## 2023-02-22 NOTE — Progress Notes (Signed)
Formatting of this note might be different from the original.  On-call SW contacted Deirdre Peer to confirm pt's transportation is scheduled for today. Deirdre Peer confirmed that transportation is arranged for 12:30 p.m. Updated MD Percell Boston.    Donnamarie Rossetti, LMSW x [phone]  Pager: [phone]    Electronically signed by Holland Falling, LMSW at 02/22/2023  9:15 AM EDT

## 2023-02-22 NOTE — Nursing Note (Signed)
Formatting of this note might be different from the original.  Patient was given discharge instructions and advised that transportation (green taxi is downstairs). Patient was unhappy with discharge instructions as she wanted to leave with pain medication prescription. MD was notified about the situation.   Electronically signed by Modesto Charon, RN at 02/22/2023  1:07 PM EDT

## 2023-02-22 NOTE — Discharge Summary (Signed)
Formatting of this note is different from the original.  Discharge Summary  General Internal [facility] [place] [facility]    Patient Name: Elizabeth Ingram  Age: 62 y.o.  Sex: female  MRN: [id number]  Admission Date: 02/21/2023  Discharge Date: 02/22/23    Attending Physician at discharge: Elizabeth Ingram  Service team: GI: LIVER [[id number]]     Chief Complaint  No chief complaint on file.    Admitting Diagnosis   RUQ Pain/ Possible Cholangitis  Secondary Diagnoses: Decompensated     Summary Statement  Patient is a 62 y.o. 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     Hospital Course by Problem    #Biliary Colic:   H/o chronic abdominal pain, has undergone extensive work-up, including several negative ERCPs. Presented to OSH w/ RUQ pain, signs of BDD 11.1 mm, Murphy's sign. Bilirubin only 0.8, HIDA scan showed normal filling of gallbladder, no jaundice on exam or fevers in history, low concern for cholecystitis, choledocholithiasis, or cholangitis. MRCP showed cholelithiasis without evidence of cholecystitis nor any biliary obstruction. Blood cultures w/ No growth. UA w/o signs of infection. Was started on zosyn for empiric coverage but was d/c on 3/16. In the absence of further signs/symptoms of cholangitis and stable vital signs, plan was made for discharge with plan to follow-up w/ GI vs surgery outpt for ERCP vs elective cholecystectomy    #Decompensated cirrhosis c/b ascites, HE, BEV:  On lasix, nadolol, and lactulose outpt. We continued these while admitted and d/c on them.      We continued home medications for all other chronic conditions.    Procedures During Hospitalization  none     Consultations During Hospitalizations  None    Significant Diagnostic Studies:   Radiology: MRCP 3/16:   IMPRESSION:    Cirrhotic liver morphology without HCC. Small volume ascites.    Cholelithiasis  without evidence cholecystitis. No biliary obstruction.    IThornton Dales, [facility]], have reviewed the study and agree with  the findings in this report.  02/22/2023 2:15 PM    Micro: 3/16 Bcx no growth to date 2/2  Special studies (Echo, colonoscopy, etc.): None    Discharge Medications  Current Discharge Medication List       CONTINUE these medications which have NOT CHANGED    Details   albuterol 0.083 % (Proventil) 2.5 mg /3 mL (0.083 %) nebulizer solution Inhale 3 mL into the lungs every 6 hours as needed for shortness of breath.     albuterol 90 mcg/actuation inhaler Inhale 2 puffs into the lungs every 6 hours as needed for wheezing.     ALPRAZolam (Xanax) 0.5 mg tablet Take 1 tablet by mouth 2 times daily.     amLODIPine (Norvasc) 10 mg tablet Take 1 tablet by mouth every evening.     citalopram (Celexa) 40 mg tablet Take 1 tablet by mouth daily.     fluticasone propionate-salmeterol (Advair Diskus) 250-50 mcg/dose powder inhaler Inhale 1 puff into the lungs 2 times daily. Rinse mouth without swallowing after use.     fluticasone-umeclidinium-vilanterol (Trelegy Ellipta) 100-62.5-25 mcg/dose powder inhaler Inhale 1 puff into the lungs once daily. Rinse mouth without swallowing after use.     furosemide (Lasix) 40 mg tablet Take 1 tablet by mouth daily as needed (as needed).     lactulose (Duphalac) 10  gram/15 mL (15 mL) Solution Take 30 mL by mouth 2 times daily.     nadoloL (Corgard) 20 mg tablet Take 1 tablet by mouth daily.     omeprazole (Prilosec) 40 mg delayed release capsule Take 1 capsule by mouth twice daily before breakfast & dinner. Take 30 min-1 hr before eating. Capsule can be opened, contents mixed with 1 tablespoon of applesauce and swallowed immediately without chewing.     ondansetron (Zofran) 8 mg tablet Take 1 tablet by mouth every 8 hours as needed for nausea.     oxyCODONE (OXY-IR) 5 mg immediate release capsule Take 1 capsule by mouth 2 times daily.     potassium chloride  (Klor-Con M20) 20 mEq extended release tablet Take 1 tablet by mouth daily.         Discharge Physical Exam:  General: WNWD, NAD.  HEENT: PERRL, EOMI.   Cardiovascular: No JVD. RRR with normal S1, S2. No m/r/g. No pedal edema.  Respiratory: lungs CTAB without wheezes, rhonchi, or rales  GI: abdomen S/ND, +BS; moderately tender to palpation over epigastrum and RUQ; no rebound or guarding.  Extremities: no c/c/e  MSK: strength 5/5 throughout.   Neuro/Psych: A&OX3. No focal deficits. CNII-XII grossly intact.    Disposition  Home with Self Care    Condition on Discharge  good    Activity Level  At baseline    Diet  High protein/high calorie diet    Upcoming Appointments    Ambulatory referrals placed:  none    Labs / Studies Pending at Time of Discharge  None    Transition of Care Plan / Instructions for Outpatient Providers    Needs to f/u w/ general surgery for elective cholecystectomy. If not surgical candidate, would consider ERCP w/ stenting vs other interventions.     Patient Instructions at Discharge    Please follow up with your PCP Elizabeth Ingram in 1-2 weeks. You also need to see your GI doctor Elizabeth Ingram to discuss next options for your gallstones (biliary colic).     All of your medications are unchanged and you should continue to take them as prescribed.    Code Status at Time of Discharge  Full Code    [facility] Referring Physician:   Primary Care Physician: Elizabeth Ingram     Elizabeth Ingram  Internal Medicine/Pediatrics PGY-4  Pager ID: [phone]  Date: 02/22/2023    Electronically signed by Elizabeth Ingram MSCR at 02/23/2023  2:12 PM EDT

## 2023-02-22 NOTE — Progress Notes (Signed)
Formatting of this note is different from the original.  Inpatient Progress Note  [facility]    Patient Name: Elizabeth Ingram  Age: 62 y.o.  Sex: female  MRN: [id number]  Date: 02/22/2023  Admission Date: 02/21/2023  Hospital Day #:  1  Service: Liver    Subjective     Yesterday, discontinued zosyn following negative cultures and no systemic infectious signs/symptoms. No acute events overnight. Patient feels improved this morning and is amenable to discharge.    Objective   Scheduled Meds:   amLODIPine  10 mg Oral QPM    budesonide-formoterol  2 puff Inhalation BID    citalopram  40 mg Oral Daily    lactulose  20 g Oral BID    nadoloL  20 mg Oral Daily    nicotine  1 patch Transdermal Daily    pantoprazole  40 mg Oral Before bfast & before dinner     Continuous Infusions:   PRN Meds:acetaminophen, albuterol 0.083 %, albuterol, ALPRAZolam, aluminum-magnesium hydroxide-simethicone, famotidine, furosemide, melatonin, ondansetron, ondansetron, oxyCODONE IR, polyethylene glycol 3350, senna-docusate, simethicone    Vitals:   Vitals:    02/22/23 0330   BP: 111/65   Pulse: 70   Resp: 16   Temp: 36.8 C (98.2 F)   TempSrc: Oral   SpO2: 96%   Weight:    Height:      Temp  Avg: 36.7 C (98.1 F)  Min: 36.6 C (97.9 F)  Max: 36.8 C (98.2 F)  Pulse  Avg: 71.7  Min: 69  Max: 77  BP  Min: 97/57  Max: 125/65  SpO2  Avg: 97.2 %  Min: 96 %  Max: 100 %     Ins & Outs (24h):    Intake/Output Summary (Last 24 hours) at 02/22/2023 0548  Last data filed at 02/22/2023 0330  Gross per 24 hour   Intake 902 ml   Output 0 ml   Net 902 ml     Physical Exam:  General: WNWD, NAD.  HEENT: PERRL, EOMI.   Cardiovascular: No JVD. RRR with normal S1, S2. No m/r/g. No pedal edema.  Respiratory: lungs CTAB without wheezes, rhonchi, or rales  GI: abdomen S/ND, +BS; moderately tender to palpation over epigastrum and RUQ; no rebound or guarding.  Extremities: no c/c/e  MSK: strength 5/5 throughout.   Neuro/Psych: A&OX3. No focal  deficits. CNII-XII grossly intact.    Labs:  Recent Labs     02/21/23  0206   WBC 2.44*   HGB 10.5*   MCV 88.8   PLT 55*   NA 141.0   K 4.2   BUN 6*   CREATININE 0.9   ANIONGAP 3   MG 1.6   ALT 8   AST 33   ALKPHOS 109   BILITOT 0.7   PROT 5.8*   LIPASE 74   INR 1.24*     Micro/path:   3/16   Bcx NGTD 2/2    Radiology:  No results found.    Assessment & Plan     Patient Active Problem List   Diagnosis    Cirrhosis    Abdominal pain    Calculus of gallbladder without cholecystitis without obstruction     DIALA FIRMIN is a 62 y.o. 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 Bilirubin only 0.8, HIDA scan showed normal filling of gallbladder, no jaundice on exam or fevers in history. Negative cultures since admission without clinical signs of infection - no concern for cholangitis.   -Will plan for outpatient ERCP vs transpapillary stent.    #Decompensated cirrhosis c/b ascites, HE, BEV:  - Volume: Lasix 40 mg PRN, continue         - Infection: Remote history of SBP, not on ppx   - Bleeding: hx of varices, continue nadolol; no TIPS  - Encephalopathy: Continue lactulose 20 g BID   - Screening: NA  - Transplant Candidacy: TBD     MELD 3.0: 11 at 02/21/2023  2:06 AM  MELD-Na: 8 at 02/21/2023  2:06 AM  Calculated from:  Serum Creatinine: 0.9 mg/dL (Using min of 1 mg/dL) at 02/21/2023  2:06 AM  Serum Sodium: 141.0 mmol/L (Using max of 137 mmol/L) at 02/21/2023  2:06 AM  Total Bilirubin: 0.7 mg/dL (Using min of 1 mg/dL) at 02/21/2023  2:06 AM  Serum Albumin: 2.7 g/dL at 02/21/2023  2:06 AM  INR(ratio): 1.24 at 02/21/2023  2:06 AM  Age at listing (hypothetical): 62 years  Sex: Female at 02/21/2023  2:06 AM    #COPD:   -  Continue home inhalers    #Hypertension:   - Continue home amlodipine     #GERD:   - Continue home PPI BID    #Mood Disorder:   - Continue home celexa 40 mg  - Contine home xanax 0.5 mg BID     FEN/Prophylaxis:  Diet: Regular. Not on maintenance IVF. Replete lytes PRN. SCDs only (thrombocytopenia).     Dispo: dc w/ plan for outpatient ERCP vs transpapillary stent    Code status: FULL    Roxine Caddy, MD  Internal Medicine/Pediatrics PGY-4  Pager ID: [phone]  Date: 02/22/2023    I have seen, examined, and discussed this patient with Dr. Mariea Clonts and agree with the findings, assessment, and plan.  Will D/C to home today  Margarita Grizzle, MD, [facility] (Attending)    Electronically signed by Margarita Grizzle, MD MSCR at 02/22/2023  9:27 AM EDT

## 2023-02-22 NOTE — Progress Notes (Signed)
Formatting of this note is different from the original.  Inpatient Progress Note  [facility]    Patient Name: Elizabeth Ingram  Age: 62 y.o.  Sex: female  MRN: [id number]  Date: 02/22/2023  Admission Date: 02/21/2023  Hospital Day #:  1  Service: Liver    Subjective     Yesterday, discontinued zosyn following negative cultures and no systemic infectious signs/symptoms. No acute events overnight. Patient feels improved this morning and is amenable to discharge.    Objective   Scheduled Meds:   amLODIPine  10 mg Oral QPM    budesonide-formoterol  2 puff Inhalation BID    citalopram  40 mg Oral Daily    lactulose  20 g Oral BID    nadoloL  20 mg Oral Daily    nicotine  1 patch Transdermal Daily    pantoprazole  40 mg Oral Before bfast & before dinner     Continuous Infusions:   PRN Meds:acetaminophen, albuterol 0.083 %, albuterol, ALPRAZolam, aluminum-magnesium hydroxide-simethicone, famotidine, furosemide, melatonin, ondansetron, ondansetron, oxyCODONE IR, polyethylene glycol 3350, senna-docusate, simethicone    Vitals:   Vitals:    02/22/23 0330   BP: 111/65   Pulse: 70   Resp: 16   Temp: 36.8 C (98.2 F)   TempSrc: Oral   SpO2: 96%   Weight:    Height:      Temp  Avg: 36.7 C (98.1 F)  Min: 36.6 C (97.9 F)  Max: 36.8 C (98.2 F)  Pulse  Avg: 71.7  Min: 69  Max: 77  BP  Min: 97/57  Max: 125/65  SpO2  Avg: 97.2 %  Min: 96 %  Max: 100 %     Ins & Outs (24h):    Intake/Output Summary (Last 24 hours) at 02/22/2023 0548  Last data filed at 02/22/2023 0330  Gross per 24 hour   Intake 902 ml   Output 0 ml   Net 902 ml     Physical Exam:  General: WNWD, NAD.  HEENT: PERRL, EOMI.   Cardiovascular: No JVD. RRR with normal S1, S2. No m/r/g. No pedal edema.  Respiratory: lungs CTAB without wheezes, rhonchi, or rales  GI: abdomen S/ND, +BS; moderately tender to palpation over epigastrum and RUQ; no rebound or guarding.  Extremities: no c/c/e  MSK: strength 5/5 throughout.   Neuro/Psych: A&OX3. No focal  deficits. CNII-XII grossly intact.    Labs:  Recent Labs     02/21/23  0206   WBC 2.44*   HGB 10.5*   MCV 88.8   PLT 55*   NA 141.0   K 4.2   BUN 6*   CREATININE 0.9   ANIONGAP 3   MG 1.6   ALT 8   AST 33   ALKPHOS 109   BILITOT 0.7   PROT 5.8*   LIPASE 74   INR 1.24*     Micro/path:   3/16   Bcx NGTD 2/2    Radiology:  No results found.    Assessment & Plan     Patient Active Problem List   Diagnosis    Cirrhosis    Abdominal pain    Calculus of gallbladder without cholecystitis without obstruction     Elizabeth Ingram is a 62 y.o. 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 Bilirubin only 0.8, HIDA scan showed normal filling of gallbladder, no jaundice on exam or fevers in history. Negative cultures since admission without clinical signs of infection - no concern for cholangitis.   -Will plan for outpatient ERCP vs transpapillary stent.    #Decompensated cirrhosis c/b ascites, HE, BEV:  - Volume: Lasix 40 mg PRN, continue         - Infection: Remote history of SBP, not on ppx   - Bleeding: hx of varices, continue nadolol; no TIPS  - Encephalopathy: Continue lactulose 20 g BID   - Screening: NA  - Transplant Candidacy: TBD     MELD 3.0: 11 at 02/21/2023  2:06 AM  MELD-Na: 8 at 02/21/2023  2:06 AM  Calculated from:  Serum Creatinine: 0.9 mg/dL (Using min of 1 mg/dL) at 02/21/2023  2:06 AM  Serum Sodium: 141.0 mmol/L (Using max of 137 mmol/L) at 02/21/2023  2:06 AM  Total Bilirubin: 0.7 mg/dL (Using min of 1 mg/dL) at 02/21/2023  2:06 AM  Serum Albumin: 2.7 g/dL at 02/21/2023  2:06 AM  INR(ratio): 1.24 at 02/21/2023  2:06 AM  Age at listing (hypothetical): 61 years  Sex: Female at 02/21/2023  2:06 AM    #COPD:   -  Continue home inhalers    #Hypertension:   - Continue home amlodipine     #GERD:   - Continue home PPI BID    #Mood Disorder:   - Continue home celexa 40 mg  - Contine home xanax 0.5 mg BID     FEN/Prophylaxis:  Diet: Regular. Not on maintenance IVF. Replete lytes PRN. SCDs only (thrombocytopenia).     Dispo: dc w/ plan for outpatient ERCP vs transpapillary stent    Code status: FULL    Roxine Caddy, MD  Internal Medicine/Pediatrics PGY-4  Pager ID: [phone]  Date: 02/22/2023    I have seen, examined, and discussed this patient with Dr. Mariea Clonts and agree with the findings, assessment, and plan.  Will D/C to home today  Margarita Grizzle, MD, [facility] (Attending)    Electronically signed by Margarita Grizzle, MD MSCR at 02/22/2023  9:27 AM EDT

## 2023-02-22 NOTE — Progress Notes (Signed)
Formatting of this note might be different from the original.  RT instructed pt on proper MDI with spacer use including a 10 count breath hold and always rinsing her mouth with all inhaled corticosteroid use.  Electronically signed by Ilda Foil, RRT at 02/22/2023  2:16 PM EDT

## 2023-02-22 NOTE — Discharge Summary (Signed)
Formatting of this note is different from the original.  Discharge Summary  General Internal [place] [facility] [place]    Patient Name: Elizabeth Ingram  Age: 62 y.o.  Sex: female  MRN: [id number]  Admission Date: 02/21/2023  Discharge Date: 02/22/23    Attending Physician at discharge: Margarita Grizzle, MD  Service team: GI: LIVER [[id number]]     Chief Complaint  No chief complaint on file.    Admitting Diagnosis   RUQ Pain/ Possible Cholangitis  Secondary Diagnoses: Decompensated     Summary Statement  Patient is a 62 y.o. 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     Hospital Course by Problem    #Biliary Colic:   H/o chronic abdominal pain, has undergone extensive work-up, including several negative ERCPs. Presented to OSH w/ RUQ pain, signs of BDD 11.1 mm, Murphy's sign. Bilirubin only 0.8, HIDA scan showed normal filling of gallbladder, no jaundice on exam or fevers in history, low concern for cholecystitis, choledocholithiasis, or cholangitis. MRCP showed cholelithiasis without evidence of cholecystitis nor any biliary obstruction. Blood cultures w/ No growth. UA w/o signs of infection. Was started on zosyn for empiric coverage but was d/c on 3/16. In the absence of further signs/symptoms of cholangitis and stable vital signs, plan was made for discharge with plan to follow-up w/ GI vs surgery outpt for ERCP vs elective cholecystectomy    #Decompensated cirrhosis c/b ascites, HE, BEV:  On lasix, nadolol, and lactulose outpt. We continued these while admitted and d/c on them.      We continued home medications for all other chronic conditions.    Procedures During Hospitalization  none     Consultations During Hospitalizations  None    Significant Diagnostic Studies:   Radiology: MRCP 3/16:   IMPRESSION:    Cirrhotic liver morphology without HCC. Small volume ascites.    Cholelithiasis  without evidence cholecystitis. No biliary obstruction.    IThornton Dales, MD [facility]], have reviewed the study and agree with  the findings in this report.  02/22/2023 2:15 PM    Micro: 3/16 Bcx no growth to date 2/2  Special studies (Echo, colonoscopy, etc.): None    Discharge Medications  Current Discharge Medication List       CONTINUE these medications which have NOT CHANGED    Details   albuterol 0.083 % (Proventil) 2.5 mg /3 mL (0.083 %) nebulizer solution Inhale 3 mL into the lungs every 6 hours as needed for shortness of breath.     albuterol 90 mcg/actuation inhaler Inhale 2 puffs into the lungs every 6 hours as needed for wheezing.     ALPRAZolam (Xanax) 0.5 mg tablet Take 1 tablet by mouth 2 times daily.     amLODIPine (Norvasc) 10 mg tablet Take 1 tablet by mouth every evening.     citalopram (Celexa) 40 mg tablet Take 1 tablet by mouth daily.     fluticasone propionate-salmeterol (Advair Diskus) 250-50 mcg/dose powder inhaler Inhale 1 puff into the lungs 2 times daily. Rinse mouth without swallowing after use.     fluticasone-umeclidinium-vilanterol (Trelegy Ellipta) 100-62.5-25 mcg/dose powder inhaler Inhale 1 puff into the lungs once daily. Rinse mouth without swallowing after use.     furosemide (Lasix) 40 mg tablet Take 1 tablet by mouth daily as needed (as needed).     lactulose (Duphalac) 10  gram/15 mL (15 mL) Solution Take 30 mL by mouth 2 times daily.     nadoloL (Corgard) 20 mg tablet Take 1 tablet by mouth daily.     omeprazole (Prilosec) 40 mg delayed release capsule Take 1 capsule by mouth twice daily before breakfast & dinner. Take 30 min-1 hr before eating. Capsule can be opened, contents mixed with 1 tablespoon of applesauce and swallowed immediately without chewing.     ondansetron (Zofran) 8 mg tablet Take 1 tablet by mouth every 8 hours as needed for nausea.     oxyCODONE (OXY-IR) 5 mg immediate release capsule Take 1 capsule by mouth 2 times daily.     potassium chloride  (Klor-Con M20) 20 mEq extended release tablet Take 1 tablet by mouth daily.         Discharge Physical Exam:  General: WNWD, NAD.  HEENT: PERRL, EOMI.   Cardiovascular: No JVD. RRR with normal S1, S2. No m/r/g. No pedal edema.  Respiratory: lungs CTAB without wheezes, rhonchi, or rales  GI: abdomen S/ND, +BS; moderately tender to palpation over epigastrum and RUQ; no rebound or guarding.  Extremities: no c/c/e  MSK: strength 5/5 throughout.   Neuro/Psych: A&OX3. No focal deficits. CNII-XII grossly intact.    Disposition  Home with Self Care    Condition on Discharge  good    Activity Level  At baseline    Diet  High protein/high calorie diet    Upcoming Appointments    Ambulatory referrals placed:  none    Labs / Studies Pending at Time of Discharge  None    Transition of Care Plan / Instructions for Outpatient Providers    Needs to f/u w/ general surgery for elective cholecystectomy. If not surgical candidate, would consider ERCP w/ stenting vs other interventions.     Patient Instructions at Discharge    Please follow up with your PCP Dr. Darrel Hoover in 1-2 weeks. You also need to see your GI doctor Dr. Dot Been to discuss next options for your gallstones (biliary colic).     All of your medications are unchanged and you should continue to take them as prescribed.    Code Status at Time of Discharge  Full Code    [facility] Referring Physician:   Primary Care Physician: [place] J GRNAK, DO     Roxine Caddy, MD  Internal Medicine/Pediatrics PGY-4  Pager ID: [phone]  Date: 02/22/2023    Electronically signed by Margarita Grizzle, MD [facility] at 02/23/2023  2:12 PM EDT

## 2023-02-22 NOTE — Progress Notes (Signed)
Formatting of this note might be different from the original.  On-call SW contacted Deirdre Peer to confirm pt's transportation is scheduled for today. Deirdre Peer confirmed that transportation is arranged for 12:30 p.m. Updated MD Percell Boston.    Donnamarie Rossetti, LMSW x [phone]  Pager: [phone]    Electronically signed by Holland Falling, LMSW at 02/22/2023  9:15 AM EDT

## 2023-02-24 ENCOUNTER — Encounter: Payer: MEDICARE | Primary: Internal Medicine

## 2023-02-26 NOTE — Telephone Encounter (Signed)
Attempt to contact patient to discuss their concerns was unsuccessful.  Voicemail made to return call.

## 2023-02-26 NOTE — Telephone Encounter (Signed)
Patient called, requests an appointment with Dr Darrel Hoover tomorrow, there are no appointments available, Patient states that she went to [facility] ER this past Friday, they sent her to [place], she returned home on Sunday - with instructions to follow up with Dr Darrel Hoover. Patient states that they found gallstones lodged between her liver and kidney, that couldn't be removed yet.  Elizabeth Ingram feels very weak, can hardly walk around and the right side of her body is at a pain level of 10/10.  Patient advised to go to back to the hospital - she wants to see Dr Darrel Hoover.  Please call patient at [phone]

## 2023-03-03 ENCOUNTER — Encounter: Payer: MEDICARE | Attending: Cardiovascular Disease | Primary: Internal Medicine

## 2023-03-03 NOTE — Progress Notes (Unsigned)
[facility] Follow Up                 Reason for Visit: Follow-up testing    Subjective:     Patient is a 62 y.o. female with a PMH of hypertension, substance abuse, cirrhosis, COPD, CKD stage III, and HCV who presents for follow-up.  The patient was last seen in September 2023.  An ESE was ordered for atypical chest pain and RAE noted on ECG.  The patient canceled the ESE.    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)     Cirrhosis of liver without ascites ([place]) 03/06/2021    Hypokalemia 10/09/2021    Nausea 05/20/2021    Obesity (BMI 30.0-34.9) 03/06/2021    Primary hypertension 03/06/2021    Stage 2 chronic kidney disease 03/06/2021    Stage 2 chronic kidney disease     Thrombocytopenia ([place]) 04/17/2021      Past Surgical History:   Procedure Laterality Date    NEUROLOGICAL SURGERY      nerve in left arm surgery 2009    ORTHOPEDIC SURGERY      x2 back     TUBAL LIGATION        Family History   Problem Relation Age of Onset    Colon Cancer Neg Hx     Breast Cancer Neg Hx     Ovarian Cancer Neg Hx       Social History     Tobacco Use    Smoking status: Every Day     Current packs/day: 0.50     Average packs/day: 0.5 packs/day for 40.0 years (20.0 ttl pk-yrs)     Types: Cigarettes     Passive exposure: Current    Smokeless tobacco: Never    Tobacco comments:     Patient down to 5-6 cigarettes a day   Substance Use Topics    Alcohol use: No      Allergies   Allergen Reactions    Gabapentin Other (See Comments)     Pt reports decreased respirations while taking this med         ROS:  No obvious pertinent positives on review of systems except for what was outlined above.       Objective:       There were no vitals taken for this visit.    BP Readings from Last 3 Encounters:   01/22/23 (!) 103/58   10/23/22 116/63   09/05/22 (!) 144/70       Wt Readings from Last 3 Encounters:   01/22/23 69.9 kg (154 lb)    10/23/22 68 kg (150 lb)   09/05/22 65.8 kg (145 lb)       General/Constitutional:   Alert and oriented x 3, no acute distress  HEENT:   normocephalic, atraumatic, moist mucous membranes  Neck:   No JVD or carotid bruits bilaterally  Cardiovascular:   regular rate and rhythm, no rub/gallop appreciated  Pulmonary:   clear to auscultation bilaterally, no respiratory distress  Abdomen:   soft, non-tender, non-distended  Ext:   No sig LE edema bilaterally  Skin:  warm and dry, no obvious rashes seen  Neuro:   no obvious sensory or motor deficits  Psychiatric:   normal mood and affect      ECG: ***    Data Review:  Lab Results   Component Value Date    CHOL 137 03/06/2021     Lab Results   Component Value Date    TRIG 89 03/06/2021     Lab Results   Component Value Date    HDL 42 03/06/2021     Lab Results   Component Value Date    LDLCALC 78 03/06/2021     Lab Results   Component Value Date    VLDL 17 03/06/2021     No results found for: "CHOLHDLRATIO"     Lab Results   Component Value Date/Time    NA 140 01/22/2023 02:32 PM    NA 142 07/09/2022 09:27 AM    NA 139 04/07/2022 03:25 PM    K 3.6 01/22/2023 02:32 PM    K 3.1 07/09/2022 09:27 AM    K 3.7 04/07/2022 03:25 PM    CL 109 01/22/2023 02:32 PM    CL 109 07/09/2022 09:27 AM    CL 109 04/07/2022 03:25 PM    CO2 28 01/22/2023 02:32 PM    CO2 26 07/09/2022 09:27 AM    CO2 28 04/07/2022 03:25 PM    BUN 5 01/22/2023 02:32 PM    BUN 6 07/09/2022 09:27 AM    BUN 8 04/07/2022 03:25 PM    CREATININE 0.80 01/22/2023 02:32 PM    CREATININE 1.00 07/09/2022 09:27 AM    CREATININE 1.20 04/07/2022 03:25 PM    GLUCOSE 83 01/22/2023 02:32 PM    GLUCOSE 108 07/09/2022 09:27 AM    GLUCOSE 91 04/07/2022 03:25 PM    CALCIUM 8.7 01/22/2023 02:32 PM    CALCIUM 9.1 07/09/2022 09:27 AM    CALCIUM 9.2 04/07/2022 03:25 PM         Lab Results   Component Value Date    ALT 8 (L) 01/22/2023    ALT 10 (L) 10/23/2022    ALT 16 07/09/2022    AST 22 01/22/2023    AST 27 10/23/2022    AST 36  07/09/2022        Assessment/Plan:   There are no diagnoses linked to this encounter.    F/U: ***    Hoyle Sauer, MD

## 2023-03-05 ENCOUNTER — Encounter

## 2023-03-05 NOTE — Telephone Encounter (Signed)
Attempt to contact patient to discuss their concerns was unsuccessful.  Voicemail made to return call.

## 2023-03-13 ENCOUNTER — Encounter: Payer: MEDICARE | Attending: Family | Primary: Internal Medicine

## 2023-03-28 ENCOUNTER — Ambulatory Visit: Payer: MEDICARE

## 2023-04-02 ENCOUNTER — Encounter: Payer: MEDICARE | Primary: Internal Medicine

## 2023-04-06 ENCOUNTER — Encounter

## 2023-04-06 MED ORDER — AMLODIPINE BESYLATE 10 MG PO TABS
10 | ORAL_TABLET | Freq: Every morning | ORAL | 0 refills | Status: AC
Start: 2023-04-06 — End: ?

## 2023-04-06 MED ORDER — ALPRAZOLAM 0.5 MG PO TABS
0.5 | ORAL_TABLET | Freq: Two times a day (BID) | ORAL | 2 refills | Status: AC
Start: 2023-04-06 — End: 2023-07-14

## 2023-04-06 NOTE — Telephone Encounter (Signed)
Patient called this afternoon and stated that she was needing a refill on the pended medications since she will run out before she comes in for her appointment in June

## 2023-04-09 ENCOUNTER — Encounter: Payer: MEDICARE | Attending: Cardiovascular Disease | Primary: Internal Medicine

## 2023-04-09 NOTE — Progress Notes (Unsigned)
[facility] Follow Up                 Reason for Visit: Follow-up testing     Subjective:      Patient is a 62 y.o. female with a PMH of hypertension, substance abuse, cirrhosis, COPD, CKD stage III, and HCV who presents for follow-up.  The patient was last seen in September 2023.  An ESE was ordered for atypical chest pain and RAE noted on ECG.  The patient canceled the ESE.    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)     Cirrhosis of liver without ascites (HCC) 03/06/2021    Hypokalemia 10/09/2021    Nausea 05/20/2021    Obesity (BMI 30.0-34.9) 03/06/2021    Primary hypertension 03/06/2021    Stage 2 chronic kidney disease 03/06/2021    Stage 2 chronic kidney disease     Thrombocytopenia (HCC) 04/17/2021      Past Surgical History:   Procedure Laterality Date    NEUROLOGICAL SURGERY      nerve in left arm surgery 2009    ORTHOPEDIC SURGERY      x2 back     TUBAL LIGATION        Family History   Problem Relation Age of Onset    Colon Cancer Neg Hx     Breast Cancer Neg Hx     Ovarian Cancer Neg Hx       Social History     Tobacco Use    Smoking status: Every Day     Current packs/day: 0.50     Average packs/day: 0.5 packs/day for 40.0 years (20.0 ttl pk-yrs)     Types: Cigarettes     Passive exposure: Current    Smokeless tobacco: Never    Tobacco comments:     Patient down to 5-6 cigarettes a day   Substance Use Topics    Alcohol use: No      Allergies   Allergen Reactions    Gabapentin Other (See Comments)     Pt reports decreased respirations while taking this med         ROS:  No obvious pertinent positives on review of systems except for what was outlined above.       Objective:       There were no vitals taken for this visit.    BP Readings from Last 3 Encounters:   01/22/23 (!) 103/58   10/23/22 116/63   09/05/22 (!) 144/70       Wt Readings from Last 3 Encounters:   01/22/23 69.9 kg (154 lb)    10/23/22 68 kg (150 lb)   09/05/22 65.8 kg (145 lb)       General/Constitutional:   Alert and oriented x 3, no acute distress  HEENT:   normocephalic, atraumatic, moist mucous membranes  Neck:   No JVD or carotid bruits bilaterally  Cardiovascular:   regular rate and rhythm, no rub/gallop appreciated  Pulmonary:   clear to auscultation bilaterally, no respiratory distress  Abdomen:   soft, non-tender, non-distended  Ext:   No sig LE edema bilaterally  Skin:  warm and dry, no obvious rashes seen  Neuro:   no obvious sensory or motor deficits  Psychiatric:   normal mood and affect      ECG: ***    Data  Review:   Lab Results   Component Value Date    CHOL 137 03/06/2021     Lab Results   Component Value Date    TRIG 89 03/06/2021     Lab Results   Component Value Date    HDL 42 03/06/2021     No components found for: "LDLCHOLESTEROL", "LDLCALC"  Lab Results   Component Value Date    VLDL 17 03/06/2021     No results found for: "CHOLHDLRATIO"     Lab Results   Component Value Date/Time    NA 140 01/22/2023 02:32 PM    NA 142 07/09/2022 09:27 AM    NA 139 04/07/2022 03:25 PM    K 3.6 01/22/2023 02:32 PM    K 3.1 07/09/2022 09:27 AM    K 3.7 04/07/2022 03:25 PM    CL 109 01/22/2023 02:32 PM    CL 109 07/09/2022 09:27 AM    CL 109 04/07/2022 03:25 PM    CO2 28 01/22/2023 02:32 PM    CO2 26 07/09/2022 09:27 AM    CO2 28 04/07/2022 03:25 PM    BUN 5 01/22/2023 02:32 PM    BUN 6 07/09/2022 09:27 AM    BUN 8 04/07/2022 03:25 PM    CREATININE 0.80 01/22/2023 02:32 PM    CREATININE 1.00 07/09/2022 09:27 AM    CREATININE 1.20 04/07/2022 03:25 PM    GLUCOSE 83 01/22/2023 02:32 PM    GLUCOSE 108 07/09/2022 09:27 AM    GLUCOSE 91 04/07/2022 03:25 PM    CALCIUM 8.7 01/22/2023 02:32 PM    CALCIUM 9.1 07/09/2022 09:27 AM    CALCIUM 9.2 04/07/2022 03:25 PM         Lab Results   Component Value Date    ALT 8 (L) 01/22/2023    ALT 10 (L) 10/23/2022    ALT 16 07/09/2022    AST 22 01/22/2023    AST 27 10/23/2022    AST 36 07/09/2022         Assessment/Plan:   There are no diagnoses linked to this encounter.    F/U: ***    Jillyn Hidden, MD

## 2023-04-15 ENCOUNTER — Encounter: Payer: MEDICARE | Attending: Family | Primary: Internal Medicine

## 2023-05-21 ENCOUNTER — Encounter: Payer: MEDICARE | Attending: Internal Medicine | Primary: Internal Medicine

## 2023-06-16 NOTE — Telephone Encounter (Signed)
Mail (no show letter from 05/21/23) returned 06/15/23. Not deliverable, unable to forward.   Letter sent to address given in chart.     I did see an encounter in chart from 04/20/23 of an office visit to establish care with a NP Schwarz in [place], [place]. I believe the pt has moved out of state.

## 2023-06-24 ENCOUNTER — Encounter

## 2023-06-25 ENCOUNTER — Encounter

## 2023-07-29 NOTE — Telephone Encounter (Signed)
error 

## 2023-08-18 ENCOUNTER — Encounter

## 2023-09-14 ENCOUNTER — Encounter

## 2023-10-08 ENCOUNTER — Encounter

## 2023-11-09 ENCOUNTER — Encounter: Admit: 2023-11-09 | Admitting: Internal Medicine

## 2023-11-09 DIAGNOSIS — E876 Hypokalemia: Secondary | ICD-10-CM

## 2024-07-31 NOTE — Discharge Summary (Addendum)
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                            PV masses: Not Seen                    RIGHT VENTRICLE                Size: Normal                                 Free Wall: Normal                         Contraction: Normal                                                    RV masses: No Masses                   TRICUSPID VALVE            Leaflets: Normal                                  Mobility: Fully Mobile                    Morphology: Normal                                        TR: No TR                                         TS: No TS  TV masses: No Masses                   RIGHT ATRIUM                Size: Normal                                RA masses: No Masses                   PERICARDIUM               Fluid: No Effusion        INFERIOR VENA CAVA                Size: Normal                                 Max Diam: 1.2 cm                                  Min Diam: 0.5 cm                      Percent Change: 59 %                                                                       Resp.Collapse: Normal Respiratory Collapse                                          RESTING ECHOCARDIOGRAPHIC MEASUREMENTS --------------------------------------------------- AORTA Measurements            Values    Units     Normal Range                              Asc.Aorta: 2.1       cm        [1.9 - 3.5]                          Asc. Aorta BSA: 1.2       cm/m2     [1.0 - 2.2]                  LEFT VENTRICLE                  LVIDd: 3.9       cm        [3.8 - 5.2]                                   LVIDs: 2.6       cm        [2.2 - 3.5]  LVIDd/BSA: 2.2       cm/m2                                                     SWT: 0.8       cm        [0.6 - 0.9]                                     PWT: 0.8       cm        [0.6 - 0.9]                            LV EF MOD BP: 64        %                                               LV EDV MOD BP: 41        mL                                                     LVEDVi: 23        mL/m2     [29 - 61]                             LV ESV MOD BP: 15        mL                                                     LVESVi: 8         mL/m2     [8 - 24]                     DIASTOLIC FUNCTION          MV Pk. E Vel.: 90.4      cm/s                                            MV Pk. A Vel.: 80.1      cm/s                                                   MV E/A: 1.1  MV DT: 218       msec                                           MV Med E' Vel.: 8.7       cm/s                                              MV Med E/e': 10.4                                                      MV Lat E'Vel.: 13.6      cm/s                                              MV Lat E/e': 6.6                                                         MV Avg E/e': 8.1       cm/s                                   LEFT ATRIUM              LA Volume: 28        ml        [18 - 58]                                      LAVi: 16        ml/m2     [16 - 34]                    INFERIOR VENA CAVA                Max.IVC: 1.2       cm        [ <= 2.1]                                   Min.IVC: 0.5       cm        [ <= 1.7]                            Percent Change: 59        %  Pressures, Gradients, and DOPPLER ECHO --------------------------------------------------- Aortic Valve            AV Pk. Vel.: 1.9       m/s                                              AV Pk. Grad.: 14        mmHg                                             AV Mn. Grad.: 8         mmHg                                                   AV VTI: 39.8      cm                                     Mitral Valve          MV Pk. E Vel.: 90.4      cm/s                                            MV Pk. A Vel.: 80.1      cm/s                                                   MV E/A: 1.1                                                    MV Inflow E Vel.: 90.4      cm/s                                        MV Annulus E'Vel.: 8.7       cm/s                                                E/E'Ratio: 10                                               Tricuspid Regurgitation Values            RA Pressure: 3  mmHg                                          Perform By: Franky Ohara, RDCS                                                          Res. Person: Franky Ohara, RDCS                                                     Electronically signed by Ripley Flash, M.D. on:07/29/2024 11:16:40 AM with status of Final The images are stored in the [facility] system, please contact the clinical provider for images related to this study.   US  lower extremity venous bilateral Result Date: 07/28/2024 EXAM:  US  LOWER EXTREMITY VENOUS BILATERAL INDICATION:  63 years old Female with elevated ddimer, possible PE (CT PE pending) with R>L leg swelling after recent fall, R07.9 Chest pain, unspecified, R06.00 Dyspnea, unspecified.   ADDITIONAL HISTORY:  None TECHNIQUE:  Serial grayscale and color Doppler ultrasound images of the deep veins of both right and left lower extremities were obtained from the inguinal through popliteal regions utilizing real-time imaging, spectral Doppler, duplex, and color Doppler technology. COMPARISON:  None Available FINDINGS: Right Deep Veins:  Proximal greater saphenous vein:  Patent and compressible Common femoral vein:  Patent and compressible Prox deep femoral vein:  Patent and compressible Femoral vein:  Patent and compressible Popliteal vein:  Patent and compressible Upper calf veins:  Patent and compressible Left Deep Veins:  Proximal greater saphenous vein:  Patent and compressible Common femoral vein:  Patent and compressible Prox deep femoral vein:  Patent and compressible Femoral vein:  Patent and compressible Popliteal vein:  Patent and compressible Upper calf veins:  Patent and compressible Additional Pertinent Findings:  None IMPRESSION:  No evidence of DVT in either lower extremity. Electronically Signed by:  Alexie Riofrio, MD, [facility] Radiology Electronically Signed on:  07/28/2024 11:57 PM  US  gallbladder Result Date: 07/28/2024 EXAM: US  GALLBLADDER INDICATION: 63 years old Female with gallstones, worsenign abd pain, elevated tbili, K80.20 Calculus of gallbladder without cholecystitis without obstruction. ADDITIONAL HISTORY: None. TECHNIQUE: Transabdominal serial grayscale and  limited color Doppler ultrasound images of the gallbladder and biliary tree were obtained in multiple planes. COMPARISON: None available FINDINGS: Gallbladder: decompressed Stones: large mobile stone Wall thickness: 3.4 mm per pt, ate 1 hour prior to exam Sonographic Murphy's Sign: Negative Common bile duct diameter: 5.6 mm Additional Pertinent Findings: No pericholecystic fluid. IMPRESSION:  Cholelithiasis. Mild wall thickening is favored to be due to decompressed state. No definitive evidence of cholecystitis. Electronically Signed by:  Alexie Riofrio, MD, [facility] Radiology Electronically Signed on:  07/28/2024 11:56 PM  CT abdomen pelvis with contrast Result Date: 07/28/2024 CT ABDOMEN PELVIS WITH CONTRAST Indication: Abdominal pain, acute, nonlocalized, R07.9 Chest pain, unspecified, K74.60 Unspecified cirrhosis of liver (CMS/HHS-HCC), K83.8 Other specified diseases of biliary tract Comparison: 07/26/2024 Technique: Contiguous axial images of the abdomen and pelvis were performed following the IV administration of 100 mL Isovue -300 given the indications for the exam.  Dose reduction was obtained with either Automated Exposure Control (AEC) or, if AEC could not be utilized, by adjustment of the mA and/or kV according to patient size. Findings: Lung bases: See report for the concurrently performed CT chest. Liver/gallbladder: Cirrhotic liver morphology. No focal lesion seen. Cholelithiasis with unchanged trace pericholecystic fluid. Stable dilation of the common bile duct measuring up to 1.4 cm. Pancreas: No peripancreatic fat stranding or ductal dilation. Spleen: Splenomegaly. Adrenal glands: Unremarkable. Kidneys: Symmetric renal enhancement without hydronephrosis. No lesions requiring follow-up are seen. GI: There is increased mucosal thickening and fat stranding about the colon, particularly the cecum and ascending colon. The appendix is is not confidently visualized. Pelvic viscera: Unremarkable. Other:  Esophageal and upper abdominal varices. Increased mild ascites. Stable mildly enlarged porta hepatis lymph nodes. No new abdominal or pelvic lymphadenopathy. Bones: No aggressive-appearing osseous lesions. Impression: 1. Increased mucosal thickening and fat stranding about the colon, especially the right hemicolon, favored to represent portal colopathy although a nonspecific colitis could have a similar appearance. 2. Slightly increased ascites. 3. Cirrhotic liver morphology with otherwise stable sequela portal hypertension. Electronically Signed by:  Alexie Riofrio, MD, [facility] Radiology Electronically Signed on:  07/28/2024 10:43 PM  CT chest PE protocol incl CT angiogram chest w wo contrast Result Date: 07/28/2024 CT CHEST PE  PROTOCOL INCL CT CHEST ANGIOGRAM W WO CONTRAST Comparison: 07/26/2024 Indication: Pulmonary embolism (PE) suspected, positive D-dimer, R07.9 Chest pain, unspecified, R06.00 Dyspnea, unspecified Technique: Contiguous axial images of the thorax were obtained following the IV administration of 100 mL Isovue -370 given the indications for the exam. 3D MIP reformats of the pulmonary arteries performed for further evaluation of anatomic relationships. Dose reduction was obtained with either Automated Exposure Control (AEC) or, if AEC could not be utilized, by adjustment of the mA and/or kV according to patient size. Findings: Adequate opacification of the pulmonary arteries. Detailed evaluation of the tertiary vasculature in the left lower lobe is compromised by motion artifact. Within this limitation, no pulmonary embolism is seen through the level of the proximal subsegmental arteries. No evidence of acute aortic pathology. Central airways are patent. There is bronchiolar wall thickening with areas of mucus plugging, especially in the lower lobes. There are clustered tree-in-bud and centrilobular nodules in the right middle lobe. No focal consolidations or pleural effusions. Heart size is normal  without pericardial effusion. Moderate coronary artery calcifications. No mediastinal/hilar or axillary lymphadenopathy. No aggressive osseous lesions. Impression: 1. Negative for pulmonary embolism. 2. Bibasilar bronchiolar wall thickening and mucous plugging as well as new tree-in-bud and centrilobular nodules in the right middle lobe favored to represent an atypical infection. An inflammatory reaction related to aspiration could cause a similar appearance. See report for concurrently performed CT abdomen/pelvis for those details. Electronically Signed by:  Alexie Riofrio, MD, [facility] Radiology Electronically Signed on:  07/28/2024 10:36 PM  X-ray chest single view portable Result Date: 07/28/2024 EXAM: Portable chest 16:08 hours on 07/28/2024 INDICATION: : Acute dyspnea [R06.00 (ICD-10-CM)] . COMPARISON:  Is made to a two-view exam 10/14/2023 TECHNIQUE: AP portable view of the chest was performed. FINDINGS: Frontal view of the chest is unremarkable. The lung fields are clear.  No pneumothorax or pleural effusion. Cardiovascular and mediastinal structures are within normal limits.  No acute bony abnormalities are seen in this single view with note made of slight deformity and apparent calcified formation of right lateral sixth rib. IMPRESSION: No acute cardiopulmonary disease is identified. Electronically Signed by:  Mark Knelson, MD, [facility] Radiology Electronically Signed on:  07/28/2024 4:27 PM  _____________________  Code Status: Full Code Goals of care were not addressed during this admission.   Status on Discharge:  Current activity: Walks frequently (08/02/24 0956) Current mobility: No limitation (08/02/24 0956)  Activity Recommendation: activity as tolerated  Other Discharge Instructions: Services setup at discharge: None Tubes/lines at discharge: None  Diet: Diet regular  Wound Care Order Instructions     None       _____________________  Time spent on discharge process: 45  minutes    YASMIN JAHAN CHOWDHURY MARCANTONIO, MD [facility] Contact Information:  [facility]) [facility]) [facility])  Pending tests:  Laboratory: [phone] Microbiology: [phone] Pathology: [phone] Radiology: [phone]  General questions: [phone] Pending tests: Laboratory: [phone] Microbiology: [phone] Pathology: [phone] Radiology: [phone]  General questions:  [phone] Pending tests:  Laboratory: [phone] Microbiology: [phone] Pathology: [phone] Radiology: [phone]  General questions:  [phone]

## 2024-08-10 NOTE — Progress Notes (Signed)
 [facility] Department of Family Medicine   Subjective:   Primary Language of Patient: English Interpretor needed: No  This is  Cassandra Montes who is a an established 62 y.o. female patient who presents for Hospital Follow Up (Pt had a fall and crack ribs)   History of Present Illness Cassandra Montes is a 63 y.o. female that presents to Clinic for Hospital Follow Up (Pt had a fall and crack ribs)  Admitted for COPD exacerbation 8/21 to 8/26; on Trelegy; given 5 days of steroids and antibiotics Cirrhosis: started on spironolactone for ascites; lasix 20 mg daily, Lactulose  10G tid, Nadolol 20 mg daily, KCL 40 mEq daily Hypo magnesium: given 10 days of slo-Mag:  (need BMP and Mg in 1 week)  Chronic pain: refilled methocarbamol 500 mg tid, and 5 days supply of oxycodone 5 mg IR; given 20 tabs Anxiety: on alprazolam  1 mg tid-filled 08/08/2024, Lexapro 20 mg Substance use: hx of THC and cocaine use L distal radial fracture on 05/21/2024: son stepped on forearm, police grabbed left wrist referred to ortho History of Present Illness Cassandra Montes is a 63 year old female who presents for follow-up on her respiratory and cardiac health.  She was recently hospitalized due to worsening lung symptoms and was treated with steroids and antibiotics. She typically experiences lung flares once or twice a year, often in the summer when exposed to heat, but they are usually manageable with home treatments such as Trelegy, Helicry, and albuterol . Breathing treatments are used sparingly, primarily during severe episodes.  She experienced a recent episode of chest pain, initially thought to be a heart attack, but a coronary angiograph showed no blockages. She has not been on statins before and reports eating only once or twice a day due to a lack of appetite, which she attributes to stomach and liver issues. Her cholesterol levels were checked in August and were found to be good, with an  LDL of 70.  She has a history of gallstones and is scheduled to discuss potential gallbladder removal with a careers adviser. She currently smokes about half a pack a day and uses nicotine patches to aid in quitting. She is working on improving her nutrition and strength, eating small meals four to five times a day, including soups, yogurt, and milk for protein.  She has a history of a fracture that required a cast; she reports that she recently took the cast off herself. She is awaiting an orthopedic appointment for further evaluation. She reports vision issues, particularly at night, and has not had prescription eyeglasses before. She has experienced falls due to poor vision and is concerned about her safety at night.  She has been taking magnesium supplements but is running low and needs a refill. She also takes ondansetron  as needed for nausea. She has a history of opioid and benzodiazepine use as prescribed, and occasional marijuana use. No alcohol use for the past 16 years.   Review of Systems  Cardiovascular:        Rib pain  Musculoskeletal:  Positive for back pain and joint pain.    Past Medical History:  has a past medical history of Asthma, unspecified asthma severity, unspecified whether complicated, unspecified whether persistent (HHS-HCC), Cirrhosis (CMS/HHS-HCC) (2016), CKD (chronic kidney disease), COPD (chronic obstructive pulmonary disease) (CMS/HHS-HCC), Depression, Gallstones, and Systemic hypertension.   Past Surgical History:  has a past surgical history that includes back surgery; endoscopic retrograde cholangio-pancreatography w/biliary tube/stent (N/A, 11/27/2023); endoscopy of biliary  duct (11/27/2023); and endoscopic retrograde cholangio-pancreatography w/extraction stone (N/A, 11/27/2023).  Current Medications: has a current medication list which includes the following prescription(s): methocarbamol, naloxone, nicotine, spironolactone, acetaminophen , albuterol , albuterol  mdi  (proventil , ventolin , proair ) hfa, alprazolam , cholecalciferol, cyanocobalamin, diclofenac , escitalopram oxalate, trelegy ellipta, furosemide, lactulose , magnesium chloride, nadolol, nebulizer and compressor, omeprazole, oxycodone, potassium chloride , and varenicline tartrate.  Allergies: is allergic to neurontin [gabapentin].  Social History:  reports that she has been smoking cigarettes. She started smoking about 45 years ago. She has a 45.7 pack-year smoking history. She has been exposed to tobacco smoke. She has never used smokeless tobacco. She reports that she does not drink alcohol and does not use drugs.  Family History: family history includes Cirrhosis (age of onset: 47) in her brother; Cirrhosis (age of onset: 24) in her brother; Fatty liver disease in her mother.   Goals      * Quit smoking / using tobacco (pt-stated)      I am waiting for my medication in the mail to help me quit.          Objective:   Vitals:   08/10/24 1247  BP: 117/81  Pulse: 84  Temp: 36.5 C (97.7 F)  TempSrc: Oral  SpO2: 98%  Weight: 65.5 kg (144 lb 6.4 oz)  Height: 165.1 cm (5' 5)  PainSc:   8    Body mass index is 24.03 kg/m.   Physical Exam   General: well appearing, nontoxic, in NAD Eyes:  Sclerae nonicteric, conjunctivae noninjected, EOMI.  HENT: Mucus membranes moist. No pharyngeal lesions or exudates  Neck:  Supple. Cervical LAD absent.  CV:  RRR. Normal S1 and S2, no murmur/rub/gallop  Chest wall pain noted under left breast Lungs: normal work of breathing, CTAB Abdomen: +BS, soft, nontender, nondistended. No hepatosplenomegaly appreciated.  Skin: Visible skin warm, dry and intact.  Neurologic:  alert and conversant. Gait normal.  Psychiatric:  normal mood and affect.    No results found for this visit on 08/10/24.  Results LABS LDL: 70 (07/2024)  DIAGNOSTIC Coronary angiography: No stenosis in coronary arteries  Assessment & Plan   Diagnoses and all orders  for this visit:  Blurry vision Other visual disturbances Worsening night vision; no recent eye exam conducted. - Ensure referral for eye exam is completed. - Advise her to schedule an eye exam. -     Ambulatory Referral to Ophthalmology  Closed fracture of distal end of left radius, unspecified fracture morphology, initial encounter Unspecified fracture of the lower end of left radius Left radius fracture previously casted; ongoing pain reported post-cast removal. - Provide orthopedic referral to [facility]. - Instruct her to schedule an appointment with [facility].  Hypomagnesemia Magnesium levels require re-evaluation post-ER treatment. - Order lab tests to recheck magnesium levels. - Adjust magnesium supplementation based on lab results. -     Basic Metabolic Panel (BMP); Future -     Magnesium; Future  Moderate protein-calorie malnutrition (CMS/HHS-HCC) Malnutrition likely due to poor appetite and inadequate intake. - Encourage intake of protein-rich foods and supplements. - Recommend starting with one protein supplement per day, increasing to two if tolerated. - Monitor nutritional intake and adjust as needed.  Tobacco use disorder Nicotine dependence Long-term smoker using nicotine patches; motivated to quit, considering Chantix with liver function considerations. - Continue nicotine patches. - Prescribe Chantix as an additional smoking cessation aid. - Monitor for side effects and effectiveness of Chantix. -     nicotine (NICODERM CQ) 21 mg/24 hr patch;  Place 1 patch onto the skin once daily -     varenicline tartrate (CHANTIX STARTING MONTH PAK) tablet; Follow package directions.  Chronic pain syndrome Chronic pain managed with oxycodone; recent medication theft caused withdrawal symptoms. - Continue oxycodone as prescribed. - Follow up with Arthea Pepper for pain management. -discussed UDS and +THC and cocaine use.  -     naloxone (NARCAN) 4  mg/actuation nasal spray; Place 1 spray (4 mg total) into one nostril as directed -     oxyCODONE (ROXICODONE) 5 MG immediate release tablet; Take 1 tablet (5 mg total) by mouth every 6 (six) hours as needed for Pain for up to 30 days No more than 4 tablets in 24 hours  Controlled substance agreement signed by Dr. Michaeline 08/18/23  Cirrhosis of liver with ascites, unspecified hepatic cirrhosis type  (CMS/HHS-HCC) Unspecified cirrhosis of liver Statin therapy for heart disease prevention requires evaluation of liver function due to cirrhosis. - Review liver function tests and imaging for statin therapy suitability. - Follow up with Dr. Myrna for liver management. -     spironolactone (ALDACTONE) 25 MG tablet; Take 0.5 tablets (12.5 mg total) by mouth once daily  Other orders -     methocarbamoL (ROBAXIN) 500 MG tablet; Take 1 tablet (500 mg total) by mouth 3 (three) times daily   Assessment & Plan     AuditC <redacted file path>:  no further action Alcohol Use: Not At Risk (08/10/2024)   AUDIT-C    Frequency of Alcohol Consumption: Never    Average Number of Drinks: Patient does not drink    Frequency of Binge Drinking: Never     PHQ2/9 <redacted file path> (From today's flowsheet) Follow Up Plan: Continue current behavioral health services      Weight Screening (BMI calculated from today's vitals) BMI is in the acceptable range Normal BMI for adults is 18.5 to 24.9 Body mass index is 24.03 kg/m.  SDOH/PRAPARE <redacted file path>    No data recorded   Pregnancy Intention No data recorded           11/17/2023    9:51 AM 02/22/2024    2:29 PM 06/13/2024    1:00 PM  PHQ-2/9 Depression Screening   Little interest or pleasure in doing things 0 0 1  Feeling down, depressed, or hopeless 0 0 0  Patient Health Questionnaire-2 Score 0 * 0 * 1    * Data saved with a previous flowsheet row definition          * No data to display            Return in  about 3 months (around 11/09/2024) for MD override okay.  Future Appointments     Date/Time Provider Department Center Visit Type   09/07/2024 11:00 AM (Arrive by 10:45 AM) Pepper Arthea Rush, MD [facility]   09/29/2024 8:40 AM (Arrive by 8:10 AM) Myrna Manuelita Longs, MD [facility]   11/10/2024 10:00 AM (Arrive by 9:40 AM) [facility]  CT Cancer Ctr CT CIRRHOSIS ABD PEL W   11/10/2024 11:40 AM (Arrive by 11:10 AM) Myrna Manuelita Longs, MD [facility]   11/15/2024 8:40 AM (Arrive by 8:25 AM) Michaeline Pearly Landsman, MD [facility] [place] FOLLOW UP   12/05/2024 2:15 PM NURSE PREOP TELEPHONE SCREEN-1 [facility] PASS NURSE PHONE  CALL   12/15/2024 9:45 AM (Arrive by 9:30 AM) Devora Almarie Lawrence, [place] [facility]        BABAFEMI VALERI BRUNSWICK, MD  This visit was coded based on medical decision making (MDM).    This note has been created using automated tools and reviewed for accuracy by BABAFEMI SIJABULISO ONABANJO. *Some images could not be shown.

## 2024-09-07 NOTE — Progress Notes (Signed)
 [facility] New Evaluation Note  Date of visit: 09/07/2024 MRN: [id number] DOB: January 03, 1961  Seeing at the request of Self No address on file  Chief Complaint: Low back and leg pain  Goal:   Patient Reported Outcomes:    02/03/2024  SPINE SCORING PORTION OF QUESTIONNAIRES  PROMIS Pain Interference T-Score (range: 10 - 90) 72 (severe)*  --   PROMIS Physical Function T-Score 41 (mild dysfunction)  [facility] MyChart PROMIS Global Health Scoring (Physical) 32.4 (Moderate Symptoms)  [facility] MyChart PROMIS Global Health Scoring (Mental) 33.8 (Moderate Symptoms)     Assessment  Cassandra Montes is a 63 y.o. 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  Patient denies bowel or bladder incontinence, perineal numbness or focal weakness.  She does endorse pain limitations.  She reports she is independent with ADLs and ambulation.  Patient has full strength in her bilateral lower extremities when asked for effort.  She is without evidence of upper motor neuron signs of the bilateral lower extremities on my examination today.  CT thoracic spine 07/26/2024 shows no listhesis or vertebral body fracture.  CT lumbar spine shows no listhesis or vertebral body fracture although it is significant for prior L2-L4 laminectomies.  Diagnostically an x-ray of her lumbar spine 4 view was discussed to evaluate for any acute osseous abnormalities and to evaluate for dynamic instability.  It was also recommended that she get an MRI of her lumbar  spine to further evaluate the anatomy and help with future treatment planning.  This was deferred by the patient.  Multiple other treatment options were discussed with the patient as documented below.  1. Chronic bilateral low back pain with right-sided sciatica   2. Hx of laminectomy    Today we discussed physical and psychological vicious cycles.  We discussed the importance of being treated in a multimodal interdisciplinary model utilizing the CIPM toolbox.  The following plan was determined utilizing shared decision-making model.  We reviewed etiology, predisposing factor(s), natural course, imaging results as well as treatment options including medications, physical therapy/exercise, therapeutic injections, and surgery. The risks, consequences, alternatives, and benefits of various treatment options were discussed with the patient in great detail.   Plan/Recommendations: - Medications: No new prescription at this time. Patient will continue current meds.  - I informed this patient that I do not recommend the use of Tylenol  as she has cirrhosis of her liver.  Patient and son expressed understanding.  -I do not recommend coadministration of opioids with benzodiazepines due to increased risk of inadvertent overdose, respiratory depression and death.  This was discussed with patient who verbalized understanding.  If benzodiazepine taper is considered by primary care team, recommend close monitoring due to risk of life-threatening withdrawal symptoms.  -Recommend that opioids be prescribed as per CDC guidelines  -Unfortunately our pharmacologic options are very limited at this time due to patient's underlying liver cirrhosis and chronic kidney disease.  - Imaging: X-ray lumbar spine 4 view and MRI lumbar spine were discussed with the patient.  Patient deferred.  - Physical therapy/modalities/DME: Patient will likely benefit from physical therapy.  A referral was provided for the patient to  the  [facility] location for consideration of land-based and water-based physical therapy.  - Interventional procedures: None indicated at this time due to thrombocytopenia with platelets less 100.   - Referrals: None indicated at this time.   - Pain psychologist: Consider behavioral pain management in the future.  - Activity: Continue activity as tolerated.  - Education: Cauda equina and associated symptoms, including motor weakness, bowel/bladder dysfunction, and perineal numbness were discussed. The patient was instructed to report to the Emergency department, if these symptoms occur.  - Follow-up: Return to clinic 3 months for follow-up or as needed for worsening or intractable pain.      This note was partially dictated using voice recognition software, please excuse any errors that were not corrected.  Attestation Statement:   I personally performed the service. (TP)  ZACHARY NORLEEN PEPPER, MD   Arthea Pepper, MD Assistant Professor  Department of Neurosurgery [facility]   ___________________________________________   History of Present Illness:  Onset/Context of pain:  Pain location: Right low back pain with radiation dow the right leg to the foot (nondermatomal) Onset: Years but worsened after back surgery Quality and timing of pain: Constant achy  Radiation: see above Pain rating: 8-9/10 Aggravating factors include: laying down to long, pushing herself to far  Relieving factors include: medications, cream  Numbness/Tingling: same as above  Sleep: varies Mood: comes and goes  Red flag symptoms: Bowel and bladder: No loss of control Saddle anesthesia: denies Weakness: denies focal weakness, endorses pain limitations  Home situation: Lives with son Work situation: On disability, has not worked in years Functionality: Patient reports she  is independent with ADLs and ambulation.  Pertinent History:  h/o Thrombocytopenia/bleeding  tendency/platelet dysfunction: yes h/o Liver disease/abnormal liver function:  Hep C with cirrohsis  h/o Chronic kidney disease (CKD)/abnormal kidney function:  CKD III Patient on dialysis? no Anticoagulation/Aspirin?: no      Current pain treatments include:  Diclofenac  topical (helpful) Oxycodone (on for years, somewhat helpful) Tylenol  (helpful) Xanax  Citalopram   Previous pain treatments included: Medications:  Topicals - see above  NSAIDs - CKD III  Acetaminophen  - liver cirrhosis, see above  Antidepressants - see above, Bupropion, Sertraline   Antiepileptics - allergic to gabapentin   Muscle Relaxants - Methocarbamol (was taken off this medication)  Opioids - see above  Steroids -  PT: Years  Injections: Patient has had prior epidural (not helpful) Surgery: Patient is s/p L2-L4 laminectomy (2016 in [place]) Other: Psychology/Acupuncture/Chiropractic care: Not in years  Chart review: Today I have reviewed available medical information in the patient's medical record, including relevant provider notes, laboratory work, and imaging.   Past Medical History: Patient Active Problem List  Diagnosis   Cirrhosis of liver (CMS/HHS-HCC)   Pancytopenia (CMS-HCC)   COPD (chronic obstructive pulmonary disease) (CMS/HHS-HCC)   Chronic pain syndrome   Controlled substance agreement signed by Dr. Michaeline 08/18/23   Gallstones   Chronic hepatitis C virus infection with cirrhosis (CMS/HHS-HCC)   Common bile duct dilation   Cigarette smoker   Essential hypertension   Pre-procedural examination   Acute chest pain   Past Medical History:  Diagnosis Date   Asthma, unspecified asthma severity, unspecified whether complicated, unspecified whether persistent (HHS-HCC)    Cirrhosis (CMS/HHS-HCC) 2016   CKD (chronic kidney disease)    COPD (chronic obstructive pulmonary disease) (CMS/HHS-HCC)    Depression    Gallstones    Systemic hypertension     Past  Surgical History: Past Surgical History:  Procedure Laterality Date   ENDOSCOPIC RETROGRADE CHOLANGIO-PANCREATOGRAPHY W/BILIARY TUBE/STENT N/A 11/27/2023   Procedure: ERCP; WITH PLACEMENT OF ENDOSCOPIC STENT INTO BILIARY / PANCREATIC DUCT, INCLUDING PRE- AND POST-DILATION AND GUIDE WIRE PASSAGE, WHEN PERFORMED, INCLUDING SPHINCTEROTOMY, WHEN PERFORMED, EACH STENT;  Surgeon: Burbridge, Asberry Caldron, MD;  Location: [facility];  Service: Gastroenterology;  Laterality: N/A;   ENDOSCOPY OF BILIARY DUCT  11/27/2023   Procedure: ENDOSCOPIC CATHETERIZATION OF THE BILIARY DUCTAL SYSTEM, RADIOLOGICAL SUPERVISION AND INTERPRETATION;  Surgeon: Burbridge, Asberry Caldron, MD;  Location: [facility];  Service: Gastroenterology;;   ENDOSCOPIC RETROGRADE CHOLANGIO-PANCREATOGRAPHY W/EXTRACTION STONE N/A 11/27/2023   Procedure: ENDOSCOPIC RETROGRADE CHOLANGIOPANCREATOGRAPHY (ERCP); WITH REMOVAL OF CALCULI/DEBRIS FROM BILIARY/PANCREATIC DUCT(S);  Surgeon: Burbridge, Asberry Caldron, MD;  Location: [facility];  Service: Gastroenterology;  Laterality: N/A;  sludge removed   back surgery     x2    Medications: Medications were reconciled and updated in the electronic medical record.   Current Outpatient Medications:    acetaminophen  (TYLENOL ) 325 MG tablet, Take 2 tablets (650 mg total) by mouth every 6 (six) hours as needed for Pain, Disp: 100 tablet, Rfl: 1   albuterol  (PROVENTIL ) 2.5 mg /3 mL (0.083 %) nebulizer solution, Take 3 mLs (2.5 mg total) by nebulization every 4 (four) hours as needed for Wheezing, Disp: 75 mL, Rfl: 11   albuterol  MDI, PROVENTIL , VENTOLIN , PROAIR , HFA 90 mcg/actuation inhaler, Inhale 2 inhalations into the lungs every 4 (four) hours as needed for Wheezing, Disp: 1 each, Rfl: 0   ALPRAZolam  (XANAX ) 1 MG tablet, Take 1 mg by mouth 3 (three) times daily as needed, Disp: , Rfl:    cholecalciferol 1000 unit tablet, Take by mouth once daily States dosage is  unknown, Disp: , Rfl:    cyanocobalamin (VITAMIN B12) 1000 MCG tablet, Take 1,000 mcg by mouth once daily States dosage is unknown, Disp: , Rfl:    diclofenac  (VOLTAREN ) 1 % topical gel, Apply 2 g to upper joint up to 4 times daily or 4 g to lower joint up to 4 times daily. Max 32 g daily total body area. No more than 2 joints at a time. (Patient taking differently: 2 (two) times daily Apply 2 g to upper joint up to 4 times daily or 4 g to lower joint up to 4 times daily. Max 32 g daily total body area. No more than 2 joints at a time.), Disp: 200 g, Rfl: 3   escitalopram oxalate (LEXAPRO) 20 MG tablet, Take 1 tablet (20 mg total) by mouth once daily, Disp: 90 tablet, Rfl: 3   fluticasone-umeclidinium-vilanterol (TRELEGY ELLIPTA) 100-62.5-25 mcg inhaler, Inhale 1 Puff into the lungs once daily, Disp: 60 each, Rfl: 1   FUROsemide (LASIX) 20 MG tablet, Take 1 tablet (20 mg total) by mouth once daily as needed for Edema, Disp: 30 tablet, Rfl: 0   lactulose  (ENULOSE ) 10 gram/15 mL oral solution, Take 15 mLs by mouth 3 (three) times daily, Disp: 946 mL, Rfl: 3   magnesium oxide (MAG-OX) 400 mg (241.3 mg magnesium) tablet, Take 1 tablet (400 mg total) by mouth once daily, Disp: 30 tablet, Rfl: 0   methocarbamoL (ROBAXIN) 500 MG tablet, Take 1 tablet (500 mg total) by mouth 3 (three) times daily, Disp: 15 tablet, Rfl: 0   nadoloL (CORGARD) 20 MG tablet, Take 1 tablet (20 mg total) by mouth once daily, Disp: 30 tablet, Rfl: 0   naloxone (NARCAN) 4 mg/actuation nasal spray, Place 1 spray (4 mg total) into one nostril as  directed, Disp: 2 each, Rfl: 1   nebulizer and compressor Devi, Use 1 each as needed (respiratoyr distress or wheezing), Disp: , Rfl:    omeprazole (PRILOSEC) 40 MG DR capsule, TAKE 1 CAPSULE BY MOUTH DAILY, Disp: 30 capsule, Rfl: 2   oxyCODONE (ROXICODONE) 5 MG immediate release tablet, Take 1 tablet (5 mg total) by mouth every 6 (six) hours as needed for Pain for up to 30 days No  more than 4 tablets in 24 hours, Disp: 120 tablet, Rfl: 0   potassium chloride  (KLOR-CON  M20) 20 MEQ ER tablet, Take 1 tablet (20 mEq total) by mouth once daily, Disp: 90 tablet, Rfl: 0   spironolactone (ALDACTONE) 25 MG tablet, Take 0.5 tablets (12.5 mg total) by mouth once daily, Disp: 15 tablet, Rfl: 0   varenicline tartrate (CHANTIX STARTING MONTH PAK) tablet, Follow package directions., Disp: 53 tablet, Rfl: 0   nicotine (NICODERM CQ) 21 mg/24 hr patch, Place 1 patch onto the skin once daily (Patient not taking: Reported on 09/07/2024), Disp: 28 patch, Rfl: 0  Allergies: Allergies  Allergen Reactions   Neurontin [Gabapentin] Hives    Social History: Social History   Tobacco Use   Smoking status: Every Day    Current packs/day: 1.00    Average packs/day: 1 pack/day for 45.7 years (45.7 ttl pk-yrs)    Types: Cigarettes    Start date: 1980    Passive exposure: Past   Smokeless tobacco: Never   Tobacco comments:    Smokes half a pack of cigarettes daily as of 02/22/2024    3 cpd as of 06/13/24    Pt states smokes 3 cigs a day as of 07/06/2024  Vaping Use   Vaping status: Never Used  Substance Use Topics   Alcohol use: Never   Drug use: Never      Family history: Family History  Problem Relation Name Age of Onset   Fatty liver disease Mother     Cirrhosis Brother Ozell 40   Cirrhosis Brother James 50   Anesthesia problems Neg Hx     Malignant hyperthermia Neg Hx     Malignant hypertension Neg Hx        Physical Exam: Blood pressure (!) 149/81, pulse 78, height 165.1 cm (5' 5), weight 67.8 kg (149 lb 9.3 oz), SpO2 100%. General: Well-nourished, well-developed, female in no acute distress Respiratory: Non-labored breathing pattern on RA. No respiratory distress Cardiovascular: No edema or cyanosis.  Skin: No appreciable rashes or skin breakdown Psych: Appropriate affect, answers questions appropriately Musculoskeletal:   Inspection -  No gross  appendicular or axial deformities   Palpation -  Tender to palpation right lower lumbar region and right buttock   ROM - Lumbar flexion/ extension/ lateral rotation is limited due to pain    Special tests  - SIJ provocation testing (+) for: Patrick's bilaterally  - Facet loading (+) on the right - Straight Leg Raise (equivocal) bilaterally   Neurologic: Motor Segment Action Right Left   L2 Hip Flexion 5/5  5/5    L3  Knee Extension 5/5 5/5  L4 Ankle Dorsiflexion 5/5 5/5  L5  Long Toe Extension 5/5 5/5  S1 Ankle Plantarflexion 5/5 5/5  (segments are from the International Standards for Neurological Classification of Spinal Cord Injury) *full strength when asked for effort  Reflexes: No hyperreflexia noted in the bilateral lower extremities  Upper Motor Neuron Signs: Reflex Right Left  Clonus None None   Sensory -  Intact to light touch at  bilateral upper and lower extremities.  Gait/Station - Patient able to stand from a seated position without balance issues  Diagnostic Tests: CT thoracic spine 07/26/2024 epic CT lumbar spine 07/26/2024 epic  *Some images could not be shown.

## 2024-09-07 NOTE — Progress Notes (Signed)
 Albuterol , Prilosec, Oxycodone (states only has a few left to take), and Zofram.

## 2024-11-01 ENCOUNTER — Other Ambulatory Visit (HOSPITAL_BASED_OUTPATIENT_CLINIC_OR_DEPARTMENT_OTHER): Payer: Self-pay

## 2024-11-01 MED ORDER — ALPRAZOLAM 1 MG PO TABS
1.0000 mg | ORAL_TABLET | Freq: Three times a day (TID) | ORAL | 3 refills | Status: AC
  Filled 2024-11-01 – 2024-11-02 (×2): qty 120, 30d supply, fill #0
  Filled 2024-11-30: qty 120, 30d supply, fill #1
  Filled 2024-12-28: qty 120, 30d supply, fill #2

## 2024-11-02 ENCOUNTER — Other Ambulatory Visit (HOSPITAL_BASED_OUTPATIENT_CLINIC_OR_DEPARTMENT_OTHER): Payer: Self-pay

## 2024-11-23 ENCOUNTER — Emergency Department (HOSPITAL_COMMUNITY)

## 2024-11-23 ENCOUNTER — Other Ambulatory Visit: Payer: Self-pay

## 2024-11-23 ENCOUNTER — Other Ambulatory Visit (HOSPITAL_BASED_OUTPATIENT_CLINIC_OR_DEPARTMENT_OTHER): Payer: Self-pay

## 2024-11-23 ENCOUNTER — Emergency Department (HOSPITAL_COMMUNITY)
Admission: EM | Admit: 2024-11-23 | Discharge: 2024-11-23 | Disposition: A | Discharge status: Home / Self Care | Attending: Emergency Medicine | Admitting: Emergency Medicine

## 2024-11-23 ENCOUNTER — Encounter (HOSPITAL_COMMUNITY): Payer: Self-pay

## 2024-11-23 DIAGNOSIS — R748 Abnormal levels of other serum enzymes: Secondary | ICD-10-CM | POA: Diagnosis not present

## 2024-11-23 DIAGNOSIS — J449 Chronic obstructive pulmonary disease, unspecified: Secondary | ICD-10-CM | POA: Insufficient documentation

## 2024-11-23 DIAGNOSIS — Z79899 Other long term (current) drug therapy: Secondary | ICD-10-CM | POA: Diagnosis not present

## 2024-11-23 DIAGNOSIS — Z7982 Long term (current) use of aspirin: Secondary | ICD-10-CM | POA: Diagnosis not present

## 2024-11-23 DIAGNOSIS — R1031 Right lower quadrant pain: Secondary | ICD-10-CM | POA: Insufficient documentation

## 2024-11-23 DIAGNOSIS — R1011 Right upper quadrant pain: Secondary | ICD-10-CM | POA: Insufficient documentation

## 2024-11-23 DIAGNOSIS — Z7951 Long term (current) use of inhaled steroids: Secondary | ICD-10-CM | POA: Insufficient documentation

## 2024-11-23 LAB — COMPREHENSIVE METABOLIC PANEL WITH GFR
ALT: 9 U/L (ref 0–44)
AST: 31 U/L (ref 15–41)
Albumin: 3.6 g/dL (ref 3.5–5.0)
Alkaline Phosphatase: 137 U/L — ABNORMAL HIGH (ref 38–126)
Anion gap: 13 (ref 5–15)
BUN: 10 mg/dL (ref 8–23)
CO2: 21 mmol/L — ABNORMAL LOW (ref 22–32)
Calcium: 9 mg/dL (ref 8.9–10.3)
Chloride: 108 mmol/L (ref 98–111)
Creatinine, Ser: 0.84 mg/dL (ref 0.44–1.00)
GFR, Estimated: >60 mL/min (ref >60)
Glucose, Bld: 89 mg/dL (ref 70–99)
Potassium: 3.7 mmol/L (ref 3.5–5.1)
Sodium: 142 mmol/L (ref 135–145)
Total Bilirubin: 1.1 mg/dL (ref 0.0–1.2)
Total Protein: 7.4 g/dL (ref 6.5–8.1)

## 2024-11-23 LAB — URINALYSIS, ROUTINE W REFLEX MICROSCOPIC
Bilirubin Urine: NEGATIVE
Glucose, UA: NEGATIVE mg/dL
Hgb urine dipstick: NEGATIVE
Ketones, ur: NEGATIVE mg/dL
Leukocytes,Ua: NEGATIVE
Nitrite: NEGATIVE
Protein, ur: NEGATIVE mg/dL
Specific Gravity, Urine: 1.017 (ref 1.005–1.030)
pH: 6 (ref 5.0–8.0)

## 2024-11-23 LAB — CBC
HCT: 38 % (ref 36.0–46.0)
Hemoglobin: 11.9 g/dL — ABNORMAL LOW (ref 12.0–15.0)
MCH: 28.3 pg (ref 26.0–34.0)
MCHC: 31.3 g/dL (ref 30.0–36.0)
MCV: 90.3 fL (ref 80.0–100.0)
Platelets: 72 K/uL — ABNORMAL LOW (ref 150–400)
RBC: 4.21 MIL/uL (ref 3.87–5.11)
RDW: 15.6 % — ABNORMAL HIGH (ref 11.5–15.5)
WBC: 3.6 K/uL — ABNORMAL LOW (ref 4.0–10.5)
nRBC: 0 % (ref 0.0–0.2)

## 2024-11-23 LAB — LIPASE, BLOOD: Lipase: 28 U/L (ref 11–51)

## 2024-11-23 MED ORDER — IOHEXOL 300 MG/ML  SOLN
100.0000 mL | Freq: Once | INTRAMUSCULAR | Status: AC | PRN
  Administered 2024-11-23: 19:00:00 100 mL via INTRAVENOUS

## 2024-11-23 MED ORDER — MORPHINE SULFATE (PF) 4 MG/ML IV SOLN
4.0000 mg | Freq: Once | INTRAVENOUS | Status: AC
  Administered 2024-11-23: 20:00:00 4 mg via INTRAVENOUS
  Filled 2024-11-23: qty 1

## 2024-11-23 MED ORDER — ONDANSETRON HCL 4 MG/2ML IJ SOLN
4.0000 mg | Freq: Once | INTRAMUSCULAR | Status: AC
  Administered 2024-11-23: 18:00:00 4 mg via INTRAVENOUS
  Filled 2024-11-23: qty 2

## 2024-11-23 MED ORDER — HYDROMORPHONE HCL 1 MG/ML IJ SOLN
1.0000 mg | Freq: Once | INTRAMUSCULAR | Status: AC
  Administered 2024-11-23: 22:00:00 1 mg via INTRAVENOUS
  Filled 2024-11-23: qty 1

## 2024-11-23 MED ORDER — MORPHINE SULFATE (PF) 4 MG/ML IV SOLN
4.0000 mg | Freq: Once | INTRAVENOUS | Status: AC
  Administered 2024-11-23: 18:00:00 4 mg via INTRAVENOUS
  Filled 2024-11-23: qty 1

## 2024-11-23 MED ORDER — HYDROMORPHONE HCL 2 MG PO TABS: 2.0000 mg | ORAL_TABLET | Freq: Three times a day (TID) | ORAL | 0 refills | Status: AC | PRN

## 2024-11-23 MED ORDER — ONDANSETRON HCL 4 MG/2ML IJ SOLN
4.0000 mg | Freq: Once | INTRAMUSCULAR | Status: AC
  Administered 2024-11-23: 20:00:00 4 mg via INTRAVENOUS
  Filled 2024-11-23: qty 2

## 2024-11-23 NOTE — ED Provider Notes (Signed)
 [facility] AT [facility] Provider Note   CSN: [id number] Arrival date & time: 11/23/24  1407     Patient presents with: Abdominal Pain   Cassandra Montes is a 63 y.o. female with a history including chronic hepatitis C with cirrhosis, cholelithiasis under the care of [facility], prior choledocholithiasis resolved after ERCP, also history of COPD, chronic abdominal pain, history of esophageal varices presenting for evaluation of right mid to lower abdominal pain which she woke with this morning and has progressively worsened.  She also endorses abdominal distention.  She has been been passing flatus and had a normal bowel movement this morning, this did not change her symptoms.  She feels she needs to burp but has been unable to.  She denies chest pain or shortness of breath, denies fevers.  She denies dysuria, fevers or chills.  She has chronic abdominal pain the source suspected to be this gallstone, however patient states [facility] has declined surgery because of her COPD, stating it was too dangerous for her to have surgery.  {Add pertinent medical, surgical, social history, OB history to YEP:67052} The history is provided by the patient.       Prior to Admission medications  Medication Sig Start Date End Date Taking? Authorizing Provider  ADVAIR DISKUS 250-50 MCG/DOSE AEPB Inhale 1 puff into the lungs 2 (two) times daily.  01/14/17   [provider]  ALPRAZolam  (XANAX ) 0.5 MG tablet Take 0.5 mg by mouth 2 (two) times daily.  01/14/17   [provider]  ALPRAZolam  (XANAX ) 1 MG tablet Take 1 tablet (1 mg total) by mouth 3 (three) times daily and 1 tablet daily as needed. 11/01/24     amLODipine  (NORVASC ) 5 MG tablet Take 5 mg by mouth daily.  01/14/17   [provider]  Aspirin-Acetaminophen -Caffeine (HEADACHE FORMULA PO) Take 1-2 tablets by mouth once as needed (for headache pain).    [provider]  dicyclomine (BENTYL) 10 MG  capsule  04/01/17   [provider]  furosemide (LASIX) 20 MG tablet  02/22/17   [provider]  hydrochlorothiazide (MICROZIDE) 12.5 MG capsule Take 12.5 mg by mouth daily.  01/14/17   [provider]  lisinopril-hydrochlorothiazide (PRINZIDE,ZESTORETIC) 20-25 MG tablet Take by mouth.    [provider]  potassium chloride  SA (K-DUR,KLOR-CON ) 20 MEQ tablet  04/07/17   [provider]  rifaximin  (XIFAXAN ) 550 MG TABS tablet Take 1 tablet (550 mg total) by mouth 2 (two) times daily. 04/14/17   Ezzard Sonny RAMAN, PA-C  sertraline  (ZOLOFT ) 50 MG tablet Take 50 mg by mouth.    [provider]  traMADol (ULTRAM) 50 MG tablet Take 50 mg by mouth every 6 (six) hours. 01/14/17   [provider]  VENTOLIN  HFA 108 (90 Base) MCG/ACT inhaler Inhale 1-2 puffs into the lungs every 6 (six) hours as needed for wheezing or shortness of breath.  01/14/17   [provider]    Allergies: Neurontin [gabapentin]    Review of Systems  Constitutional:  Negative for chills and fever.  HENT:  Negative for congestion and sore throat.   Eyes: Negative.   Respiratory:  Negative for chest tightness and shortness of breath.   Cardiovascular:  Negative for chest pain.  Gastrointestinal:  Positive for abdominal distention, abdominal pain and nausea. Negative for blood in stool, constipation, diarrhea and vomiting.  Genitourinary: Negative.   Musculoskeletal:  Negative for arthralgias, joint swelling and neck pain.  Skin: Negative.  Negative for rash and wound.  Neurological:  Negative for dizziness, weakness, light-headedness, numbness and headaches.  Psychiatric/Behavioral: Negative.    All other systems reviewed and are negative.   Updated Vital Signs BP (!) 134/53   Pulse 81   Temp 97.6 F (36.4 C)   Resp 19   Ht 5' 5 (1.651 m)   Wt 71.7 kg   SpO2 99%   BMI 26.30 kg/m   Physical Exam Vitals and nursing note reviewed.  Constitutional:       Appearance: She is well-developed.  HENT:     Head: Normocephalic and atraumatic.  Eyes:     Conjunctiva/sclera: Conjunctivae normal.  Cardiovascular:     Rate and Rhythm: Normal rate and regular rhythm.     Heart sounds: Normal heart sounds.  Pulmonary:     Effort: Pulmonary effort is normal.     Breath sounds: Normal breath sounds. No wheezing.  Abdominal:     General: Bowel sounds are increased. There is distension.     Palpations: Abdomen is soft. There is no mass.     Tenderness: There is abdominal tenderness in the right upper quadrant and right lower quadrant. There is no guarding.  Musculoskeletal:        General: Normal range of motion.     Cervical back: Normal range of motion.  Skin:    General: Skin is warm and dry.  Neurological:     Mental Status: She is alert.     (all labs ordered are listed, but only abnormal results are displayed) Labs Reviewed  COMPREHENSIVE METABOLIC PANEL WITH GFR - Abnormal; Notable for the following components:      Result Value   CO2 21 (*)    Alkaline Phosphatase 137 (*)    All other components within normal limits  CBC - Abnormal; Notable for the following components:   WBC 3.6 (*)    Hemoglobin 11.9 (*)    RDW 15.6 (*)    Platelets 72 (*)    All other components within normal limits  LIPASE, BLOOD  URINALYSIS, ROUTINE W REFLEX MICROSCOPIC    EKG: None  Radiology: No results found.  {Document cardiac monitor, telemetry assessment procedure when appropriate:32947} Procedures   Medications Ordered in the ED  morphine  (PF) 4 MG/ML injection 4 mg (has no administration in time range)  ondansetron  (ZOFRAN ) injection 4 mg (has no administration in time range)      {Click here for ABCD2, HEART and other calculators REFRESH Note before signing:1}                              Medical Decision Making Amount and/or Complexity of Data Reviewed Labs: ordered. Radiology: ordered.  Risk Prescription drug  management.     {Document critical care time when appropriate  Document review of labs and clinical decision tools ie CHADS2VASC2, etc  Document your independent review of radiology images and any outside records  Document your discussion with family members, caretakers and with consultants  Document social determinants of health affecting pt's care  Document your decision making why or why not admission, treatments were needed:32947:::1}   Final diagnoses:  None    ED Discharge Orders     None

## 2024-11-23 NOTE — ED Notes (Signed)
 Pt given a urine specimen cup for needed urine analysis.

## 2024-11-23 NOTE — ED Triage Notes (Signed)
 Pt arrived via [facility] from home c/o recurrent LUQ abdominal pain. Pt reports her prescribed pain medication is not helping. Pt reports being seen recently at [facility] for the same. Pt reports her doctors are discussing surgical options to have her known gallstones removed.

## 2024-11-23 NOTE — Discharge Instructions (Signed)
 You have been prescribed a small dose of Dilaudid  which you may take between your doses of oxycodone if needed for improved pain relief.  As discussed, your lab tests and CT tonight are reassuring, you do have a small amount of fluid in your abdomen which may need to be drained at some point but there is not enough there now which would require this.  Please follow-up with your GI specialist.  Do not drive within 4 hours of taking either of your narcotic medications as these will make you drowsy.

## 2024-11-28 ENCOUNTER — Other Ambulatory Visit (HOSPITAL_BASED_OUTPATIENT_CLINIC_OR_DEPARTMENT_OTHER): Payer: Self-pay

## 2024-11-29 ENCOUNTER — Other Ambulatory Visit (HOSPITAL_BASED_OUTPATIENT_CLINIC_OR_DEPARTMENT_OTHER): Payer: Self-pay

## 2024-11-30 ENCOUNTER — Other Ambulatory Visit (HOSPITAL_BASED_OUTPATIENT_CLINIC_OR_DEPARTMENT_OTHER): Payer: Self-pay

## 2024-11-30 ENCOUNTER — Other Ambulatory Visit (HOSPITAL_COMMUNITY): Payer: Self-pay

## 2024-12-22 ENCOUNTER — Other Ambulatory Visit (HOSPITAL_BASED_OUTPATIENT_CLINIC_OR_DEPARTMENT_OTHER): Payer: Self-pay

## 2024-12-27 ENCOUNTER — Other Ambulatory Visit (HOSPITAL_BASED_OUTPATIENT_CLINIC_OR_DEPARTMENT_OTHER): Payer: Self-pay

## 2024-12-28 ENCOUNTER — Other Ambulatory Visit (HOSPITAL_BASED_OUTPATIENT_CLINIC_OR_DEPARTMENT_OTHER): Payer: Self-pay

## 2024-12-29 ENCOUNTER — Other Ambulatory Visit (HOSPITAL_BASED_OUTPATIENT_CLINIC_OR_DEPARTMENT_OTHER): Payer: Self-pay

## 2025-01-05 ENCOUNTER — Other Ambulatory Visit (HOSPITAL_BASED_OUTPATIENT_CLINIC_OR_DEPARTMENT_OTHER): Payer: Self-pay

## 2025-01-05 MED ORDER — NICOTINE 21 MG/24HR TD PT24: 21.0000 mg | MEDICATED_PATCH | Freq: Every day | TRANSDERMAL | 0 refills | Status: AC

## 2025-01-05 MED ORDER — ALPRAZOLAM 1 MG PO TABS: 1.0000 mg | ORAL_TABLET | Freq: Three times a day (TID) | ORAL | 3 refills | Status: AC

## 2025-01-05 MED ORDER — SPIRONOLACTONE 25 MG PO TABS: 12.5000 mg | ORAL_TABLET | Freq: Every day | ORAL | 0 refills | Status: AC

## 2025-01-05 MED ORDER — PEG 3350-KCL-NABCB-NACL-NASULF 236 G PO SOLR: ORAL | 0 refills | Status: AC

## 2025-01-05 MED ORDER — LACTULOSE 10 GM/15ML PO SOLN: 10.0000 g | Freq: Three times a day (TID) | ORAL | 0 refills | Status: AC

## 2025-01-05 MED ORDER — ESCITALOPRAM OXALATE 20 MG PO TABS: 20.0000 mg | ORAL_TABLET | Freq: Every day | ORAL | 3 refills | Status: AC

## 2025-01-05 MED ORDER — NALOXONE HCL 4 MG/0.1ML NA LIQD: NASAL | 1 refills | Status: AC

## 2025-01-05 MED ORDER — OMEPRAZOLE 40 MG PO CPDR: 40.0000 mg | DELAYED_RELEASE_CAPSULE | Freq: Every day | ORAL | 2 refills | Status: AC

## 2025-01-05 MED ORDER — ALBUTEROL SULFATE (2.5 MG/3ML) 0.083% IN NEBU: 3.0000 mL | INHALATION_SOLUTION | RESPIRATORY_TRACT | 11 refills | Status: AC | PRN

## 2025-01-05 MED ORDER — ACETAMINOPHEN 325 MG PO TABS: 650.0000 mg | ORAL_TABLET | Freq: Four times a day (QID) | ORAL | 1 refills | Status: AC | PRN

## 2025-01-05 MED ORDER — ALBUTEROL SULFATE HFA 108 (90 BASE) MCG/ACT IN AERS: 2.0000 | INHALATION_SPRAY | RESPIRATORY_TRACT | 5 refills | Status: AC | PRN

## 2025-01-05 MED ORDER — METHOCARBAMOL 500 MG PO TABS: 500.0000 mg | ORAL_TABLET | Freq: Three times a day (TID) | ORAL | 2 refills | Status: AC

## 2025-01-05 MED ORDER — TRELEGY ELLIPTA 100-62.5-25 MCG/ACT IN AEPB: 1.0000 | INHALATION_SPRAY | Freq: Every day | RESPIRATORY_TRACT | 1 refills | Status: AC

## 2025-01-05 MED ORDER — SPIRONOLACTONE 25 MG PO TABS: 12.5000 mg | ORAL_TABLET | Freq: Every day | ORAL | 5 refills | Status: AC

## 2025-01-05 MED ORDER — MAGNESIUM OXIDE 400 MG PO TABS: 400.0000 mg | ORAL_TABLET | Freq: Every day | ORAL | 5 refills | Status: AC
